# Patient Record
Sex: Male | Born: 1955
Health system: Southern US, Community
[De-identification: ages and names within clinical notes are randomized; demographics above are authoritative.]

## PROBLEM LIST (undated history)

## (undated) DIAGNOSIS — E785 Hyperlipidemia, unspecified: Secondary | ICD-10-CM

## (undated) DIAGNOSIS — J439 Emphysema, unspecified: Secondary | ICD-10-CM

## (undated) DIAGNOSIS — K219 Gastro-esophageal reflux disease without esophagitis: Secondary | ICD-10-CM

## (undated) DIAGNOSIS — F32A Depression, unspecified: Secondary | ICD-10-CM

## (undated) DIAGNOSIS — N429 Disorder of prostate, unspecified: Secondary | ICD-10-CM

## (undated) DIAGNOSIS — E119 Type 2 diabetes mellitus without complications: Secondary | ICD-10-CM

## (undated) HISTORY — DX: Gastro-esophageal reflux disease without esophagitis: K21.9

## (undated) HISTORY — DX: Emphysema, unspecified: J43.9

## (undated) HISTORY — PX: CATARACT EXTRACTION, BILATERAL: SHX1313

## (undated) HISTORY — DX: Disorder of prostate, unspecified: N42.9

---

## 2007-12-14 ENCOUNTER — Emergency Department: Payer: Self-pay | Admitting: Emergency Medicine

## 2010-02-24 ENCOUNTER — Inpatient Hospital Stay: Payer: Self-pay | Admitting: Internal Medicine

## 2011-04-08 ENCOUNTER — Ambulatory Visit: Payer: Self-pay | Admitting: Internal Medicine

## 2011-09-07 ENCOUNTER — Ambulatory Visit: Payer: Self-pay | Admitting: Internal Medicine

## 2011-09-08 ENCOUNTER — Ambulatory Visit: Payer: Self-pay | Admitting: Internal Medicine

## 2013-05-23 LAB — CBC AND DIFFERENTIAL: HEMOGLOBIN: 15.6 g/dL (ref 13.5–17.5)

## 2013-05-23 LAB — PSA: PSA: 2.6

## 2013-08-14 HISTORY — PX: COLONOSCOPY: SHX174

## 2013-08-14 LAB — HM COLONOSCOPY

## 2014-06-08 DIAGNOSIS — Z9889 Other specified postprocedural states: Secondary | ICD-10-CM | POA: Insufficient documentation

## 2014-06-08 DIAGNOSIS — Z9641 Presence of insulin pump (external) (internal): Secondary | ICD-10-CM | POA: Insufficient documentation

## 2015-01-27 LAB — MICROALBUMIN, URINE: MICROALB UR: NORMAL

## 2015-03-06 ENCOUNTER — Encounter: Payer: Self-pay | Admitting: Internal Medicine

## 2015-03-06 ENCOUNTER — Other Ambulatory Visit: Payer: Self-pay | Admitting: Internal Medicine

## 2015-03-06 DIAGNOSIS — G4700 Insomnia, unspecified: Secondary | ICD-10-CM | POA: Insufficient documentation

## 2015-03-06 DIAGNOSIS — N400 Enlarged prostate without lower urinary tract symptoms: Secondary | ICD-10-CM | POA: Insufficient documentation

## 2015-03-06 DIAGNOSIS — E1040 Type 1 diabetes mellitus with diabetic neuropathy, unspecified: Secondary | ICD-10-CM | POA: Insufficient documentation

## 2015-03-06 DIAGNOSIS — F17200 Nicotine dependence, unspecified, uncomplicated: Secondary | ICD-10-CM | POA: Insufficient documentation

## 2015-03-09 ENCOUNTER — Other Ambulatory Visit: Payer: Self-pay | Admitting: Internal Medicine

## 2015-03-09 ENCOUNTER — Encounter: Payer: Self-pay | Admitting: Internal Medicine

## 2015-03-09 ENCOUNTER — Ambulatory Visit (INDEPENDENT_AMBULATORY_CARE_PROVIDER_SITE_OTHER): Payer: BLUE CROSS/BLUE SHIELD | Admitting: Internal Medicine

## 2015-03-09 VITALS — BP 110/64 | HR 76 | Temp 98.0°F | Ht 67.0 in | Wt 161.8 lb

## 2015-03-09 DIAGNOSIS — F17200 Nicotine dependence, unspecified, uncomplicated: Secondary | ICD-10-CM

## 2015-03-09 DIAGNOSIS — J019 Acute sinusitis, unspecified: Secondary | ICD-10-CM

## 2015-03-09 DIAGNOSIS — E1042 Type 1 diabetes mellitus with diabetic polyneuropathy: Secondary | ICD-10-CM | POA: Diagnosis not present

## 2015-03-09 MED ORDER — CEFDINIR 300 MG PO CAPS: 300.0000 mg | ORAL_CAPSULE | Freq: Two times a day (BID) | ORAL | Status: DC

## 2015-03-09 NOTE — Patient Instructions (Signed)
Use Flonase nasal spray daily for 2 weeks  Use Tussin (sugar free) cough syrup to loosen chest congestion

## 2015-03-09 NOTE — Progress Notes (Signed)
Date:  03/09/2015   Name:  Paul Stokes   DOB:  [birth year]   MRN:  [id number]   Chief Complaint: Cough Cough This is a recurrent problem. The current episode started more than 1 month ago. The problem has been gradually worsening. The problem occurs hourly. The cough is productive of purulent sputum. Associated symptoms include nasal congestion, rhinorrhea and shortness of breath. Pertinent negatives include no chest pain, chills, fever, headaches, sweats or wheezing. The symptoms are aggravated by exercise. Treatments tried: Treated with a Z-Pak 6 weeks ago. The treatment provided no relief. His past medical history is significant for environmental allergies.   He quit smoking for 3 months using bupropion. However he started smoking again. He still taking bupropion because it helps with his mood disorder. This was prescribed by his endocrinologist. Type 1 diabetes - patient is followed by endocrinology currently on an insulin pump. Reports doing fairly well without complications other than mild peripheral neuropathy. He's due for ophthalmology exam. His last A1c was 8.2.  Review of Systems  Constitutional: Negative for fever and chills.  HENT: Positive for congestion, rhinorrhea and sinus pressure. Negative for tinnitus and trouble swallowing.   Respiratory: Positive for cough and shortness of breath. Negative for wheezing.   Cardiovascular: Negative for chest pain and palpitations.  Allergic/Immunologic: Positive for environmental allergies.  Neurological: Negative for weakness, light-headedness and headaches.    Patient Active Problem List   Diagnosis Date Noted  . Enlarged prostate 03/06/2015  . Insomnia, persistent 03/06/2015  . Compulsive tobacco user syndrome 03/06/2015  . Type 1 diabetes mellitus ([place]) 03/06/2015  . History of surgical procedure 06/08/2014    Prior to Admission medications   Medication Sig Start Date End Date Taking? Authorizing Provider  aspirin 81 MG tablet  Take 1 tablet by mouth daily.   Yes Historical Provider, MD  BAYER CONTOUR NEXT TEST test strip  01/01/15  Yes Historical Provider, MD  buPROPion (WELLBUTRIN SR) 150 MG 12 hr tablet Take 1 tablet by mouth 2 (two) times daily. 02/02/15  Yes Historical Provider, MD  gabapentin (NEURONTIN) 100 MG capsule Take 2 capsules by mouth daily. 03/05/15  Yes Historical Provider, MD  insulin aspart (NOVOLOG) 100 UNIT/ML injection Inject into the skin. Via insulin pump   Yes Historical Provider, MD  lisinopril (PRINIVIL,ZESTRIL) 10 MG tablet Take 1 tablet by mouth daily.   Yes Historical Provider, MD  pravastatin (PRAVACHOL) 40 MG tablet Take 1 tablet by mouth daily.   Yes Historical Provider, MD  zolpidem (AMBIEN) 10 MG tablet Take 1 tablet by mouth at bedtime. 10/13/14  Yes Historical Provider, MD    No Known Allergies  Past Surgical History  Procedure Laterality Date  . Colonoscopy  08/2013    7 tubular adenomas    Social History  Substance Use Topics  . Smoking status: Current Every Day Smoker  . Smokeless tobacco: None  . Alcohol Use: No     Medication list has been reviewed and updated.   Physical Exam  Constitutional: He is oriented to person, place, and time. He appears well-developed. No distress.  HENT:  Head: Normocephalic and atraumatic.  Right Ear: Tympanic membrane and ear canal normal.  Left Ear: Tympanic membrane and ear canal normal.  Nose: Right sinus exhibits maxillary sinus tenderness. Right sinus exhibits no frontal sinus tenderness. Left sinus exhibits maxillary sinus tenderness. Left sinus exhibits no frontal sinus tenderness.  Eyes: Conjunctivae are normal. Right eye exhibits no discharge. Left eye exhibits no discharge. No scleral  icterus.  Neck: Normal range of motion. Neck supple.  Cardiovascular: Normal rate, regular rhythm and normal heart sounds.   Pulmonary/Chest: Effort normal. No respiratory distress. He has decreased breath sounds in the right upper field and  the left upper field.  Musculoskeletal: Normal range of motion.  Neurological: He is alert and oriented to person, place, and time.  Skin: Skin is warm and dry. No rash noted.  Psychiatric: He has a normal mood and affect. His speech is normal and behavior is normal. Thought content normal.  Nursing note and vitals reviewed.   BP 110/64 mmHg  Pulse 76  Temp(Src) 98 F (36.7 C)  Ht 5\' 7"  (1.702 m)  Wt 161 lb 12.8 oz (73.392 kg)  BMI 25.34 kg/m2  SpO2 99%  Assessment and Plan: 1. Acute sinusitis, recurrence not specified, unspecified location Resume Flonase nasal spray May use diabetic Tussin for congestion and cough - cefdinir (OMNICEF) 300 MG capsule; Take 1 capsule (300 mg total) by mouth 2 (two) times daily.  Dispense: 20 capsule; Refill: 0  2. Type 1 diabetes mellitus with diabetic polyneuropathy (HCC) Followed by endocrinology - last A1c was 8.2 Currently on insulin pump  3. Compulsive tobacco user syndrome Continue bupropion With current illness resolved encouraged patient to find motivation to quit smoking again   Halina Maidens, MD [facility]  03/09/2015

## 2015-04-14 ENCOUNTER — Other Ambulatory Visit: Payer: Self-pay | Admitting: Internal Medicine

## 2015-05-26 ENCOUNTER — Encounter: Payer: Self-pay | Admitting: Internal Medicine

## 2015-05-26 ENCOUNTER — Ambulatory Visit (INDEPENDENT_AMBULATORY_CARE_PROVIDER_SITE_OTHER): Payer: BLUE CROSS/BLUE SHIELD | Admitting: Internal Medicine

## 2015-05-26 VITALS — BP 115/68 | HR 70 | Temp 97.9°F | Ht 67.0 in | Wt 163.4 lb

## 2015-05-26 DIAGNOSIS — J40 Bronchitis, not specified as acute or chronic: Secondary | ICD-10-CM

## 2015-05-26 MED ORDER — LEVOFLOXACIN 500 MG PO TABS: 500.0000 mg | ORAL_TABLET | Freq: Every day | ORAL | Status: DC

## 2015-05-26 MED ORDER — HYDROCODONE-HOMATROPINE 5-1.5 MG/5ML PO SYRP: 5.0000 mL | ORAL_SOLUTION | Freq: Four times a day (QID) | ORAL | Status: DC | PRN

## 2015-05-26 NOTE — Progress Notes (Signed)
Date:  05/26/2015   Name:  Paul Stokes   DOB:  12-05-1955   MRN:  [id number]   Chief Complaint: Cough Cough This is a new problem. The current episode started 1 to 4 weeks ago. The problem has been waxing and waning. The problem occurs every few minutes. The cough is productive of sputum. Associated symptoms include postnasal drip and wheezing. Pertinent negatives include no chills, ear pain, fever, rash, sore throat, shortness of breath or weight loss. The symptoms are aggravated by exercise. He has tried prescription cough suppressant and OTC cough suppressant for the symptoms. The treatment provided mild relief.      Review of Systems  Constitutional: Negative for fever, chills and weight loss.  HENT: Positive for postnasal drip. Negative for ear pain, hearing loss, sinus pressure, sore throat and tinnitus.   Respiratory: Positive for cough and wheezing. Negative for shortness of breath.   Gastrointestinal: Negative for nausea and diarrhea.  Musculoskeletal: Negative for neck pain and neck stiffness.  Skin: Negative for color change and rash.    Patient Active Problem List   Diagnosis Date Noted  . Enlarged prostate 03/06/2015  . Insomnia, persistent 03/06/2015  . Compulsive tobacco user syndrome 03/06/2015  . Type 1 diabetes mellitus ([place]) 03/06/2015  . History of surgical procedure 06/08/2014    Prior to Admission medications   Medication Sig Start Date End Date Taking? Authorizing Provider  aspirin 81 MG tablet Take 1 tablet by mouth daily.   Yes Historical Provider, MD  BAYER CONTOUR NEXT TEST test strip  01/01/15  Yes Historical Provider, MD  buPROPion (WELLBUTRIN SR) 150 MG 12 hr tablet Take 1 tablet by mouth 2 (two) times daily. 02/02/15  Yes Historical Provider, MD  gabapentin (NEURONTIN) 100 MG capsule Take 2 capsules by mouth daily. 03/05/15  Yes Historical Provider, MD  insulin aspart (NOVOLOG) 100 UNIT/ML injection Inject into the skin. Via insulin pump   Yes  Historical Provider, MD  lisinopril (PRINIVIL,ZESTRIL) 10 MG tablet Take 1 tablet by mouth daily.   Yes Historical Provider, MD  pravastatin (PRAVACHOL) 40 MG tablet Take 1 tablet by mouth daily.   Yes Historical Provider, MD  zolpidem (AMBIEN) 10 MG tablet TAKE ONE TABLET BY MOUTH AT BEDTIME 04/15/15  Yes Glean Hess, MD    No Known Allergies  Past Surgical History  Procedure Laterality Date  . Colonoscopy  08/2013    7 tubular adenomas    Social History  Substance Use Topics  . Smoking status: Current Every Day Smoker -- 1.00 packs/day for 47 years    Types: Cigarettes  . Smokeless tobacco: None  . Alcohol Use: No     Medication list has been reviewed and updated.   Physical Exam  Constitutional: He is oriented to person, place, and time. He appears well-developed. No distress.  HENT:  Head: Normocephalic and atraumatic.  Right Ear: Tympanic membrane and ear canal normal.  Left Ear: Tympanic membrane and ear canal normal.  Mouth/Throat: Oropharynx is clear and moist.  Eyes: Conjunctivae are normal.  Neck: Normal range of motion. Neck supple.  Cardiovascular: Normal rate, regular rhythm and normal heart sounds.   Pulmonary/Chest: Effort normal. No respiratory distress. He has decreased breath sounds in the right upper field and the left upper field. He has no wheezes. He has no rhonchi.  Musculoskeletal: Normal range of motion.  Lymphadenopathy:    He has no cervical adenopathy.  Neurological: He is alert and oriented to person, place, and  time.  Skin: Skin is warm and dry. No rash noted.  Psychiatric: He has a normal mood and affect. His behavior is normal. Thought content normal.  Nursing note and vitals reviewed.   BP 115/68 mmHg  Pulse 70  Temp(Src) 97.9 F (36.6 C) (Oral)  Ht 5\' 7"  (1.702 m)  Wt 163 lb 6.4 oz (74.118 kg)  BMI 25.59 kg/m2  SpO2 97%  Assessment and Plan: 1. Bronchitis Continue mucinex and fluids - levofloxacin (LEVAQUIN) 500 MG  tablet; Take 1 tablet (500 mg total) by mouth daily.  Dispense: 10 tablet; Refill: 0 - HYDROcodone-homatropine (HYCODAN) 5-1.5 MG/5ML syrup; Take 5 mLs by mouth every 6 (six) hours as needed for cough.  Dispense: 120 mL; Refill: 0   Halina Maidens, MD [facility]  05/26/2015

## 2015-05-26 NOTE — Patient Instructions (Signed)
Continue Mucinex twice a day

## 2015-06-14 ENCOUNTER — Encounter: Payer: Self-pay | Admitting: Internal Medicine

## 2015-06-15 ENCOUNTER — Ambulatory Visit (INDEPENDENT_AMBULATORY_CARE_PROVIDER_SITE_OTHER): Payer: BLUE CROSS/BLUE SHIELD | Admitting: Internal Medicine

## 2015-06-15 ENCOUNTER — Encounter: Payer: Self-pay | Admitting: Internal Medicine

## 2015-06-15 VITALS — BP 112/64 | HR 72 | Ht 67.0 in | Wt 166.4 lb

## 2015-06-15 DIAGNOSIS — Z114 Encounter for screening for human immunodeficiency virus [HIV]: Secondary | ICD-10-CM

## 2015-06-15 DIAGNOSIS — E1069 Type 1 diabetes mellitus with other specified complication: Secondary | ICD-10-CM | POA: Insufficient documentation

## 2015-06-15 DIAGNOSIS — R05 Cough: Secondary | ICD-10-CM

## 2015-06-15 DIAGNOSIS — D485 Neoplasm of uncertain behavior of skin: Secondary | ICD-10-CM

## 2015-06-15 DIAGNOSIS — E1042 Type 1 diabetes mellitus with diabetic polyneuropathy: Secondary | ICD-10-CM

## 2015-06-15 DIAGNOSIS — Z0000 Encounter for general adult medical examination without abnormal findings: Secondary | ICD-10-CM | POA: Diagnosis not present

## 2015-06-15 DIAGNOSIS — N401 Enlarged prostate with lower urinary tract symptoms: Secondary | ICD-10-CM | POA: Diagnosis not present

## 2015-06-15 DIAGNOSIS — E785 Hyperlipidemia, unspecified: Secondary | ICD-10-CM

## 2015-06-15 DIAGNOSIS — N138 Other obstructive and reflux uropathy: Secondary | ICD-10-CM

## 2015-06-15 DIAGNOSIS — G4700 Insomnia, unspecified: Secondary | ICD-10-CM

## 2015-06-15 DIAGNOSIS — Z1159 Encounter for screening for other viral diseases: Secondary | ICD-10-CM | POA: Diagnosis not present

## 2015-06-15 DIAGNOSIS — R059 Cough, unspecified: Secondary | ICD-10-CM

## 2015-06-15 LAB — POCT URINALYSIS DIPSTICK
BILIRUBIN UA: NEGATIVE
Blood, UA: NEGATIVE
Glucose, UA: NEGATIVE
Ketones, UA: NEGATIVE
Leukocytes, UA: NEGATIVE
NITRITE UA: NEGATIVE
PH UA: 6
Protein, UA: NEGATIVE
SPEC GRAV UA: 1.02
UROBILINOGEN UA: 0.2

## 2015-06-15 MED ORDER — TAMSULOSIN HCL 0.4 MG PO CAPS: 0.4000 mg | ORAL_CAPSULE | Freq: Every day | ORAL | Status: DC

## 2015-06-15 NOTE — Progress Notes (Signed)
Date:  06/15/2015   Name:  Paul Stokes   DOB:  Sep 05, 1955   MRN:  [id number]   Chief Complaint: Annual Exam and Diabetes Diabetes He presents for his follow-up (followed by Endocrinology) diabetic visit. He has type 1 diabetes mellitus. Pertinent negatives for hypoglycemia include no confusion, dizziness or headaches. Pertinent negatives for diabetes include no chest pain, no fatigue, no polydipsia and no polyuria.  Cough This is a chronic problem. The current episode started more than 1 month ago. The problem has been unchanged. The problem occurs every few minutes. The cough is non-productive. Associated symptoms include shortness of breath. Pertinent negatives include no chest pain, chills, fever, headaches, sore throat or wheezing. Nothing (he has been on ACEI for many years) aggravates the symptoms.  Urinary Frequency  This is a recurrent problem. The pain is mild (and slight penile curvature with erections). Associated symptoms include frequency, hesitancy and urgency. Pertinent negatives include no chills, discharge or hematuria. He has tried nothing (difficulty emptying completely - multiple episodes of nocturia) for the symptoms.   Paul Stokes is a 60 y.o. male who presents today for his Complete Annual Exam. He feels fairly well. He reports exercising none. He reports he is sleeping well with Ambien. He is cutting back on cigarettes and still taking Wellbutrin.   Review of Systems  Constitutional: Negative for fever, chills, diaphoresis and fatigue.  HENT: Positive for hearing loss and trouble swallowing. Negative for sore throat and voice change.   Eyes: Negative for visual disturbance.  Respiratory: Positive for choking, chest tightness and shortness of breath. Negative for cough and wheezing.   Cardiovascular: Negative for chest pain, palpitations and leg swelling.  Gastrointestinal: Negative for abdominal pain, diarrhea, constipation and blood in stool.  Endocrine: Negative  for polydipsia and polyuria.  Genitourinary: Positive for hesitancy, urgency, frequency, difficulty urinating and penile pain (curvature with erections). Negative for dysuria and hematuria.  Musculoskeletal: Negative for arthralgias and gait problem.  Neurological: Negative for dizziness and headaches.  Psychiatric/Behavioral: Positive for sleep disturbance. Negative for confusion and dysphoric mood.    Patient Active Problem List   Diagnosis Date Noted  . Enlarged prostate 03/06/2015  . Insomnia, persistent 03/06/2015  . Compulsive tobacco user syndrome 03/06/2015  . Type 1 diabetes mellitus ([place]) 03/06/2015    Prior to Admission medications   Medication Sig Start Date End Date Taking? Authorizing Provider  aspirin 81 MG tablet Take 1 tablet by mouth daily.   Yes Historical Provider, MD  BAYER CONTOUR NEXT TEST test strip  01/01/15  Yes Historical Provider, MD  buPROPion (WELLBUTRIN SR) 150 MG 12 hr tablet Take 1 tablet by mouth 2 (two) times daily. 02/02/15  Yes Historical Provider, MD  gabapentin (NEURONTIN) 100 MG capsule Take 2 capsules by mouth daily. 03/05/15  Yes Historical Provider, MD  insulin aspart (NOVOLOG) 100 UNIT/ML injection Inject into the skin. Via insulin pump   Yes Historical Provider, MD  lisinopril (PRINIVIL,ZESTRIL) 10 MG tablet Take 1 tablet by mouth daily.   Yes Historical Provider, MD  pravastatin (PRAVACHOL) 40 MG tablet Take 1 tablet by mouth daily.   Yes Historical Provider, MD  zolpidem (AMBIEN) 10 MG tablet TAKE ONE TABLET BY MOUTH AT BEDTIME 04/15/15  Yes Glean Hess, MD    No Known Allergies  Past Surgical History  Procedure Laterality Date  . Colonoscopy  08/2013    7 tubular adenomas    Social History  Substance Use Topics  . Smoking status:  Current Every Day Smoker -- 1.00 packs/day for 47 years    Types: Cigarettes  . Smokeless tobacco: None     Comment: trying to cut back some  . Alcohol Use: No    Medication list has been reviewed  and updated.  Physical Exam  Constitutional: He is oriented to person, place, and time. He appears well-developed and well-nourished.  HENT:  Head: Normocephalic.  Right Ear: Tympanic membrane, external ear and ear canal normal.  Left Ear: Tympanic membrane, external ear and ear canal normal.  Nose: Nose normal.  Mouth/Throat: Uvula is midline and oropharynx is clear and moist.  Eyes: Conjunctivae and EOM are normal. Pupils are equal, round, and reactive to light.  Neck: Normal range of motion. Neck supple. Carotid bruit is not present. No thyromegaly present.  Cardiovascular: Normal rate, regular rhythm, normal heart sounds and intact distal pulses.   Pulmonary/Chest: Effort normal and breath sounds normal. He has no wheezes. Right breast exhibits no mass. Left breast exhibits no mass.  Abdominal: Soft. Normal appearance and bowel sounds are normal. There is no hepatosplenomegaly. There is no tenderness.  Genitourinary: Rectum normal. Rectal exam shows no mass. Prostate is enlarged. Prostate is not tender.  Musculoskeletal: Normal range of motion.  Lymphadenopathy:    He has no cervical adenopathy.  Neurological: He is alert and oriented to person, place, and time. He has normal reflexes. No cranial nerve deficit or sensory deficit.  Skin: Skin is warm, dry and intact.     Psychiatric: He has a normal mood and affect. His speech is normal and behavior is normal. Judgment and thought content normal.  Nursing note and vitals reviewed.   BP 112/64 mmHg  Pulse 72  Ht 5\' 7"  (1.702 m)  Wt 166 lb 6.4 oz (75.479 kg)  BMI 26.06 kg/m2  Assessment and Plan: 1. Annual physical exam Vaccinations are up to date  Patient encouraged to continue smoking reduction and can continue Wellbutrin indefinitely if needed - POCT urinalysis dipstick  2. BPH with obstruction/lower urinary tract symptoms Begin medication for symptoms Pt reports mild penile curvature without pain - consider Urology  evaluation if worsening - tamsulosin (FLOMAX) 0.4 MG CAPS capsule; Take 1 capsule (0.4 mg total) by mouth at bedtime.  Dispense: 30 capsule; Refill: 3 - PSA  3. Insomnia, persistent On Ambien - TSH  4. Need for hepatitis C screening test - Hepatitis C antibody  5. Encounter for screening for HIV - HIV antibody  6. Hyperlipidemia On statin therapy - Lipid panel  7. Type 1 diabetes mellitus with diabetic polyneuropathy (HCC) Followed by endocrinology - CBC with Differential/Platelet - Comprehensive metabolic panel  8. Neoplasm of uncertain behavior of skin - Ambulatory referral to Dermatology  9. Cough Hold Lisinopril for 2 weeks; if cough resolves then call for ARB Rx If cough persists - resume Lisinopril and refer to ENT   Halina Maidens, MD [facility]  06/15/2015

## 2015-06-16 ENCOUNTER — Telehealth: Payer: Self-pay

## 2015-06-16 LAB — CBC WITH DIFFERENTIAL/PLATELET
BASOS: 2 %
Basophils Absolute: 0.2 10*3/uL (ref 0.0–0.2)
EOS (ABSOLUTE): 0.3 10*3/uL (ref 0.0–0.4)
EOS: 4 %
HEMATOCRIT: 44.5 % (ref 37.5–51.0)
Hemoglobin: 14.9 g/dL (ref 12.6–17.7)
Immature Grans (Abs): 0 10*3/uL (ref 0.0–0.1)
Immature Granulocytes: 0 %
LYMPHS ABS: 2.5 10*3/uL (ref 0.7–3.1)
Lymphs: 38 %
MCH: 29.7 pg (ref 26.6–33.0)
MCHC: 33.5 g/dL (ref 31.5–35.7)
MCV: 89 fL (ref 79–97)
MONOS ABS: 0.4 10*3/uL (ref 0.1–0.9)
Monocytes: 7 %
NEUTROS ABS: 3.3 10*3/uL (ref 1.4–7.0)
Neutrophils: 49 %
Platelets: 327 10*3/uL (ref 150–379)
RBC: 5.01 x10E6/uL (ref 4.14–5.80)
RDW: 13 % (ref 12.3–15.4)
WBC: 6.7 10*3/uL (ref 3.4–10.8)

## 2015-06-16 LAB — COMPREHENSIVE METABOLIC PANEL
A/G RATIO: 1.9 (ref 1.1–2.5)
ALK PHOS: 87 IU/L (ref 39–117)
ALT: 24 IU/L (ref 0–44)
AST: 16 IU/L (ref 0–40)
Albumin: 4.3 g/dL (ref 3.6–4.8)
BILIRUBIN TOTAL: 0.4 mg/dL (ref 0.0–1.2)
BUN/Creatinine Ratio: 17 (ref 10–22)
BUN: 17 mg/dL (ref 8–27)
CALCIUM: 9.5 mg/dL (ref 8.6–10.2)
CO2: 24 mmol/L (ref 18–29)
Chloride: 100 mmol/L (ref 96–106)
Creatinine, Ser: 1 mg/dL (ref 0.76–1.27)
GFR calc Af Amer: 94 mL/min/{1.73_m2} (ref >59)
GFR, EST NON AFRICAN AMERICAN: 81 mL/min/{1.73_m2} (ref >59)
GLOBULIN, TOTAL: 2.3 g/dL (ref 1.5–4.5)
Glucose: 204 mg/dL — ABNORMAL HIGH (ref 65–99)
POTASSIUM: 5.3 mmol/L — AB (ref 3.5–5.2)
SODIUM: 137 mmol/L (ref 134–144)
Total Protein: 6.6 g/dL (ref 6.0–8.5)

## 2015-06-16 LAB — HIV ANTIBODY (ROUTINE TESTING W REFLEX): HIV SCREEN 4TH GENERATION: NONREACTIVE

## 2015-06-16 LAB — LIPID PANEL
CHOL/HDL RATIO: 2.2 ratio (ref 0.0–5.0)
Cholesterol, Total: 101 mg/dL (ref 100–199)
HDL: 45 mg/dL (ref >39)
LDL CALC: 43 mg/dL (ref 0–99)
Triglycerides: 66 mg/dL (ref 0–149)
VLDL CHOLESTEROL CAL: 13 mg/dL (ref 5–40)

## 2015-06-16 LAB — TSH: TSH: 2.13 u[IU]/mL (ref 0.450–4.500)

## 2015-06-16 LAB — HEPATITIS C ANTIBODY: Hep C Virus Ab: <0.1 s/co ratio (ref 0.0–0.9)

## 2015-06-16 LAB — PSA: Prostate Specific Ag, Serum: 3.3 ng/mL (ref 0.0–4.0)

## 2015-06-16 NOTE — Telephone Encounter (Signed)
Spoke with patient. Patient advised of all results and verbalized understanding. Will call back with any future questions or concerns. MAH  

## 2015-06-16 NOTE — Telephone Encounter (Signed)
-----   Message from Glean Hess, MD sent at 06/16/2015  7:41 AM EST ----- Labs are normal.  HIV and Hep C are negative.  PSA is only slightly higher than in 2015.  Recommend yearly monitoring.

## 2015-06-16 NOTE — Telephone Encounter (Signed)
Left message for patient to call back  

## 2015-07-29 ENCOUNTER — Other Ambulatory Visit: Payer: Self-pay

## 2015-07-29 MED ORDER — BUPROPION HCL ER (SR) 150 MG PO TB12: 150.0000 mg | ORAL_TABLET | Freq: Two times a day (BID) | ORAL | Status: DC

## 2015-07-29 NOTE — Telephone Encounter (Signed)
Patient called in today and requested refill.  

## 2015-08-17 LAB — HEMOGLOBIN A1C: Hemoglobin A1C: 9.3

## 2015-09-09 ENCOUNTER — Other Ambulatory Visit: Payer: Self-pay | Admitting: Internal Medicine

## 2015-10-01 ENCOUNTER — Other Ambulatory Visit: Payer: Self-pay | Admitting: Internal Medicine

## 2015-10-26 ENCOUNTER — Ambulatory Visit (INDEPENDENT_AMBULATORY_CARE_PROVIDER_SITE_OTHER): Payer: BLUE CROSS/BLUE SHIELD | Admitting: Internal Medicine

## 2015-10-26 ENCOUNTER — Encounter: Payer: Self-pay | Admitting: Internal Medicine

## 2015-10-26 VITALS — BP 98/68 | HR 66 | Temp 98.2°F | Resp 16 | Ht 67.0 in | Wt 158.0 lb

## 2015-10-26 DIAGNOSIS — J069 Acute upper respiratory infection, unspecified: Secondary | ICD-10-CM

## 2015-10-26 MED ORDER — AZELASTINE HCL 0.1 % NA SOLN: 1.0000 | Freq: Two times a day (BID) | NASAL | Status: DC

## 2015-10-26 NOTE — Patient Instructions (Signed)
Claritin 10 mg - once a day (loratidine generic name)

## 2015-10-26 NOTE — Progress Notes (Signed)
Date:  10/26/2015   Name:  Paul Stokes   DOB:  Jun 12, 1955   MRN:  [id number]   Chief Complaint: Sinus Problem Sinus Problem This is a new problem. The current episode started 1 to 4 weeks ago. The problem has been waxing and waning since onset. Associated symptoms include congestion, coughing, a hoarse voice and sinus pressure. Pertinent negatives include no chills, ear pain or sore throat.  Cough with clear white phlegm intermittently.  Coughs more when he gets overheated. He has clear rhinorhea. He was given Augmentin by [facility] which caused severe nausea, vomiting and diarrhea.   Review of Systems  Constitutional: Negative for fever, chills and fatigue.  HENT: Positive for congestion, hoarse voice, postnasal drip, rhinorrhea and sinus pressure. Negative for ear pain, hearing loss, sore throat, tinnitus and trouble swallowing.   Respiratory: Positive for cough. Negative for chest tightness and wheezing.   Cardiovascular: Negative for chest pain and palpitations.    Patient Active Problem List   Diagnosis Date Noted  . BPH with obstruction/lower urinary tract symptoms 06/15/2015  . Hyperlipidemia 06/15/2015  . Insomnia, persistent 03/06/2015  . Compulsive tobacco user syndrome 03/06/2015  . Type 1 diabetes mellitus ([place]) 03/06/2015    Prior to Admission medications   Medication Sig Start Date End Date Taking? Authorizing Provider  aspirin 81 MG tablet Take 1 tablet by mouth daily.   Yes Historical Provider, MD  BAYER CONTOUR NEXT TEST test strip  01/01/15  Yes Historical Provider, MD  buPROPion (WELLBUTRIN SR) 150 MG 12 hr tablet Take 1 tablet (150 mg total) by mouth 2 (two) times daily. 07/29/15  Yes Glean Hess, MD  gabapentin (NEURONTIN) 100 MG capsule Take 2 capsules by mouth daily. 03/05/15  Yes Historical Provider, MD  insulin aspart (NOVOLOG) 100 UNIT/ML injection Inject into the skin. Via insulin pump   Yes Historical Provider, MD  lisinopril  (PRINIVIL,ZESTRIL) 10 MG tablet Take 1 tablet by mouth daily.   Yes Historical Provider, MD  pravastatin (PRAVACHOL) 40 MG tablet Take 1 tablet by mouth daily.   Yes Historical Provider, MD  tamsulosin (FLOMAX) 0.4 MG CAPS capsule TAKE 1 CAPSULE BY MOUTH AT BEDTIME 10/01/15  Yes Glean Hess, MD  VENTOLIN HFA 108 (90 Base) MCG/ACT inhaler 1-2 INHALATIONS EVERY 4-6 HOURS AS NEEDED FOR WHEEZING. DISPENSE SPACER AS NEEDED. 10/16/15  Yes Historical Provider, MD  zolpidem (AMBIEN) 10 MG tablet TAKE ONE TABLET BY MOUTH AT BEDTIME 09/09/15  Yes Glean Hess, MD    No Known Allergies  Past Surgical History  Procedure Laterality Date  . Colonoscopy  08/2013    7 tubular adenomas    Social History  Substance Use Topics  . Smoking status: Current Every Day Smoker -- 1.00 packs/day for 47 years    Types: Cigarettes  . Smokeless tobacco: None     Comment: trying to cut back some  . Alcohol Use: No     Medication list has been reviewed and updated.   Physical Exam  Constitutional: He is oriented to person, place, and time. He appears well-developed. No distress.  HENT:  Head: Normocephalic and atraumatic.  Right Ear: Tympanic membrane and ear canal normal.  Left Ear: Tympanic membrane and ear canal normal.  Nose: Right sinus exhibits no maxillary sinus tenderness and no frontal sinus tenderness. Left sinus exhibits no maxillary sinus tenderness and no frontal sinus tenderness.  Mouth/Throat: Uvula is midline and oropharynx is clear and moist.  Cardiovascular: Normal rate,  regular rhythm and normal heart sounds.   Pulmonary/Chest: Effort normal and breath sounds normal. No respiratory distress. He has no wheezes. He has no rales.  Musculoskeletal: Normal range of motion.  Neurological: He is alert and oriented to person, place, and time.  Skin: Skin is warm and dry. No rash noted.  Psychiatric: He has a normal mood and affect. His behavior is normal. Thought content normal.  Nursing  note and vitals reviewed.   BP 98/68 mmHg  Pulse 66  Temp(Src) 98.2 F (36.8 C)  Resp 16  Ht 5\' 7"  (1.702 m)  Wt 158 lb (71.668 kg)  BMI 24.74 kg/m2  SpO2 97%  Assessment and Plan: 1. Viral URI May be seasonal allergies; does not appear to be bacterial Begin Claritin 10 mg daily - azelastine (ASTELIN) 0.1 % nasal spray; Place 1-2 sprays into both nostrils 2 (two) times daily. Use in each nostril as directed  Dispense: 30 mL; Refill: [place], MD [facility]  10/26/2015

## 2015-12-24 ENCOUNTER — Encounter: Payer: Self-pay | Admitting: Internal Medicine

## 2016-04-04 ENCOUNTER — Other Ambulatory Visit: Payer: Self-pay | Admitting: Internal Medicine

## 2016-04-04 MED ORDER — ZOLPIDEM TARTRATE 10 MG PO TABS: 10.0000 mg | ORAL_TABLET | Freq: Every day | ORAL | 5 refills | Status: DC

## 2016-04-11 ENCOUNTER — Other Ambulatory Visit: Payer: Self-pay | Admitting: Internal Medicine

## 2016-09-13 ENCOUNTER — Other Ambulatory Visit: Payer: Self-pay | Admitting: Internal Medicine

## 2016-09-13 NOTE — Telephone Encounter (Signed)
Called patient to schedule med refill 11/15/16.

## 2016-09-19 LAB — HM DIABETES EYE EXAM

## 2016-09-19 LAB — HEMOGLOBIN A1C: Hemoglobin A1C: 9.1

## 2016-09-19 LAB — MICROALBUMIN, URINE: MICROALB UR: NORMAL

## 2016-10-10 ENCOUNTER — Other Ambulatory Visit: Payer: Self-pay | Admitting: Internal Medicine

## 2016-10-11 NOTE — Telephone Encounter (Signed)
Pharm requesting meds.

## 2016-11-07 ENCOUNTER — Other Ambulatory Visit: Payer: Self-pay | Admitting: Internal Medicine

## 2016-11-15 ENCOUNTER — Ambulatory Visit (INDEPENDENT_AMBULATORY_CARE_PROVIDER_SITE_OTHER): Payer: BLUE CROSS/BLUE SHIELD | Admitting: Internal Medicine

## 2016-11-15 ENCOUNTER — Encounter: Payer: Self-pay | Admitting: Internal Medicine

## 2016-11-15 VITALS — BP 114/58 | HR 71 | Ht 67.0 in | Wt 162.2 lb

## 2016-11-15 DIAGNOSIS — N401 Enlarged prostate with lower urinary tract symptoms: Secondary | ICD-10-CM | POA: Diagnosis not present

## 2016-11-15 DIAGNOSIS — N138 Other obstructive and reflux uropathy: Secondary | ICD-10-CM | POA: Diagnosis not present

## 2016-11-15 DIAGNOSIS — E1040 Type 1 diabetes mellitus with diabetic neuropathy, unspecified: Secondary | ICD-10-CM | POA: Diagnosis not present

## 2016-11-15 DIAGNOSIS — G4700 Insomnia, unspecified: Secondary | ICD-10-CM

## 2016-11-15 DIAGNOSIS — F17200 Nicotine dependence, unspecified, uncomplicated: Secondary | ICD-10-CM | POA: Diagnosis not present

## 2016-11-15 DIAGNOSIS — F39 Unspecified mood [affective] disorder: Secondary | ICD-10-CM | POA: Diagnosis not present

## 2016-11-15 MED ORDER — BUPROPION HCL ER (SR) 150 MG PO TB12: 150.0000 mg | ORAL_TABLET | Freq: Two times a day (BID) | ORAL | 12 refills | Status: DC

## 2016-11-15 MED ORDER — TAMSULOSIN HCL 0.4 MG PO CAPS: 0.4000 mg | ORAL_CAPSULE | Freq: Every day | ORAL | 12 refills | Status: DC

## 2016-11-15 NOTE — Progress Notes (Signed)
Date:  11/15/2016   Name:  Paul Stokes   DOB:  1955/10/07   MRN:  [id number]   Chief Complaint: Insomnia (In order to keep Lorrin Mais needs to be seen periodically. - Needs no refills at this time. ) Insomnia  Primary symptoms: sleep disturbance.  The problem occurs nightly. The problem is unchanged. The symptoms are relieved by medication.  Benign Prostatic Hypertrophy  This is a chronic problem. The problem is unchanged. Irritative symptoms include frequency and nocturia. Obstructive symptoms include incomplete emptying, an intermittent stream and a slower stream. Pertinent negatives include no chills. Past treatments include tamsulosin. The treatment provided moderate relief.  Mood disorder - started on wellbutrin to help quit smoking but then found it helped his mood as well.  He is doing well currently. DM - on a new insulin pump and his control is much improved.  He is still working on diet.  He did stop lisinopril due to cough but did not get another medication.    Review of Systems  Constitutional: Negative for chills, fatigue and fever.  HENT: Positive for postnasal drip. Negative for congestion.   Eyes: Positive for visual disturbance.  Respiratory: Positive for cough (AM cough). Negative for chest tightness, shortness of breath and wheezing.   Cardiovascular: Negative for chest pain and palpitations.  Gastrointestinal: Negative for abdominal pain.  Genitourinary: Positive for decreased urine volume, frequency, incomplete emptying and nocturia.  Musculoskeletal: Negative for arthralgias.  Neurological: Positive for numbness and headaches. Negative for dizziness.  Hematological: Negative for adenopathy.  Psychiatric/Behavioral: Positive for sleep disturbance. Negative for dysphoric mood. The patient has insomnia. The patient is not nervous/anxious.     Patient Active Problem List   Diagnosis Date Noted  . BPH with obstruction/lower urinary tract symptoms 06/15/2015  .  Hyperlipidemia 06/15/2015  . Insomnia, persistent 03/06/2015  . Compulsive tobacco user syndrome 03/06/2015  . Type 1 diabetes mellitus with sensory neuropathy ([place]) 03/06/2015  . Presence of insulin pump 06/08/2014    Prior to Admission medications   Medication Sig Start Date End Date Taking? Authorizing Provider  aspirin 81 MG tablet Take 1 tablet by mouth daily.    [provider]  azelastine (ASTELIN) 0.1 % nasal spray Place 1-2 sprays into both nostrils 2 (two) times daily. Use in each nostril as directed 10/26/15   Glean Hess, MD  BAYER CONTOUR NEXT TEST test strip  01/01/15   [provider]  buPROPion [facility] SR) 150 MG 12 hr tablet TAKE ONE TABLET BY MOUTH TWICE DAILY 09/13/16   Glean Hess, MD  gabapentin (NEURONTIN) 100 MG capsule Take 2 capsules by mouth daily. 03/05/15   [provider]  insulin aspart (NOVOLOG) 100 UNIT/ML injection Inject into the skin. Via insulin pump    [provider]  lisinopril (PRINIVIL,ZESTRIL) 10 MG tablet Take 1 tablet by mouth daily.    [provider]  pravastatin (PRAVACHOL) 40 MG tablet Take 1 tablet by mouth daily.    [provider]  tamsulosin (FLOMAX) 0.4 MG CAPS capsule TAKE 1 CAPSULE BY MOUTH AT BEDTIME 10/01/15   Glean Hess, MD  VENTOLIN HFA 108 (90 Base) MCG/ACT inhaler 1-2 INHALATIONS EVERY 4-6 HOURS AS NEEDED FOR WHEEZING. DISPENSE SPACER AS NEEDED. 10/16/15   [provider]  zolpidem (AMBIEN) 10 MG tablet TAKE 1 TABLET BY MOUTH AT BEDTIME 11/07/16   Glean Hess, MD    Allergies  Allergen Reactions  . Clavulanic Acid Nausea And Vomiting  .  Lisinopril Cough    Past Surgical History:  Procedure Laterality Date  . COLONOSCOPY  08/2013   7 tubular adenomas    Social History  Substance Use Topics  . Smoking status: Current Every Day Smoker    Packs/day: 1.00    Years: 47.00    Types: Cigarettes  . Smokeless tobacco: Never Used     Comment:  trying to cut back some  . Alcohol use No   Depression screen PHQ 2/9 11/15/2016  Decreased Interest 0  Down, Depressed, Hopeless 0  PHQ - 2 Score 0     Medication list has been reviewed and updated.   Physical Exam  Constitutional: He is oriented to person, place, and time. He appears well-developed. No distress.  HENT:  Head: Normocephalic and atraumatic.  Neck: Normal range of motion. Neck supple.  Cardiovascular: Normal rate, regular rhythm and normal heart sounds.   Pulmonary/Chest: Effort normal and breath sounds normal. No respiratory distress. He has no wheezes.  Abdominal: Soft.  Musculoskeletal: He exhibits no edema or tenderness.  Neurological: He is alert and oriented to person, place, and time.  Skin: Skin is warm and dry. No rash noted.  Psychiatric: He has a normal mood and affect. His behavior is normal. Thought content normal.  Nursing note and vitals reviewed.   BP (!) 114/58   Pulse 71   Ht 5\' 7"  (1.702 m)   Wt 162 lb 3.2 oz (73.6 kg)   SpO2 97%   BMI 25.40 kg/m   Assessment and Plan: 1. Type 1 diabetes mellitus with sensory neuropathy (HCC) Followed by Endo Ask about another medication such as an ARB for renal protection  2. BPH with obstruction/lower urinary tract symptoms Resume tamsulosin - tamsulosin (FLOMAX) 0.4 MG CAPS capsule; Take 1 capsule (0.4 mg total) by mouth at bedtime.  Dispense: 30 capsule; Refill: 12  3. Insomnia, persistent On ambien without side effects  4. Compulsive tobacco user syndrome Has reduced use but does not feel he can quit completely at this time  5. Mood disorder ([place]) Doing well on Wellbutrin   Meds ordered this encounter  Medications  . tamsulosin (FLOMAX) 0.4 MG CAPS capsule    Sig: Take 1 capsule (0.4 mg total) by mouth at bedtime.    Dispense:  30 capsule    Refill:  12  . buPROPion (WELLBUTRIN SR) 150 MG 12 hr tablet    Sig: Take 1 tablet (150 mg total) by mouth 2 (two) times daily.    Dispense:  60  tablet    Refill:  12    FOR NEXT FILL THANKS    Halina Maidens, MD [facility]  11/15/2016

## 2016-11-15 NOTE — Patient Instructions (Signed)
Ask Dr. Gabriel Carina about another medication to protect your kidneys that is not lisinopril.

## 2017-04-20 ENCOUNTER — Other Ambulatory Visit: Payer: Self-pay | Admitting: Internal Medicine

## 2017-05-18 ENCOUNTER — Encounter: Payer: BLUE CROSS/BLUE SHIELD | Admitting: Internal Medicine

## 2017-07-25 LAB — HEMOGLOBIN A1C: Hemoglobin A1C: 9.3

## 2017-07-25 LAB — MICROALBUMIN, URINE: Microalb, Ur: NORMAL

## 2017-09-04 ENCOUNTER — Ambulatory Visit (INDEPENDENT_AMBULATORY_CARE_PROVIDER_SITE_OTHER): Payer: No Typology Code available for payment source | Admitting: Internal Medicine

## 2017-09-04 ENCOUNTER — Encounter: Payer: Self-pay | Admitting: Internal Medicine

## 2017-09-04 VITALS — BP 134/74 | HR 97 | Ht 67.0 in | Wt 169.0 lb

## 2017-09-04 DIAGNOSIS — F39 Unspecified mood [affective] disorder: Secondary | ICD-10-CM | POA: Diagnosis not present

## 2017-09-04 MED ORDER — BUPROPION HCL ER (SR) 150 MG PO TB12: ORAL_TABLET | ORAL | 3 refills | Status: DC

## 2017-09-04 NOTE — Progress Notes (Signed)
Date:  09/04/2017   Name:  Paul Stokes   DOB:  March 15, 1956   MRN:  [id number]   Chief Complaint: Anxiety (Taking Bupropion and Ambien. Having serious mood swings. Concerned about continue medications. When taking 3 tablets a day for one day last week he felt like it helped him better but didn't want to continue without doctors advise. GAD 7- 17. )  Anxiety  Presents for follow-up visit. Symptoms include nervous/anxious behavior and shortness of breath (intermittently but has started smoking again). Patient reports no chest pain, dizziness, palpitations or suicidal ideas. Symptoms occur most days. The severity of symptoms is moderate and causing significant distress. The quality of sleep is fair.   Compliance with medications is 76-100% (has been taking welbutrin for many years).  Mood disorder - has been present for years, with insomnia.  Sx are not depressive, mostly irritable.  He denies suicidal thoughts or plans.    Review of Systems  Constitutional: Negative for chills, fatigue and fever.  Respiratory: Positive for shortness of breath (intermittently but has started smoking again). Negative for choking, chest tightness and wheezing.   Cardiovascular: Negative for chest pain, palpitations and leg swelling.  Musculoskeletal: Positive for arthralgias and gait problem. Negative for back pain.  Neurological: Negative for dizziness and headaches.  Psychiatric/Behavioral: Positive for sleep disturbance. Negative for dysphoric mood, hallucinations, self-injury and suicidal ideas. The patient is nervous/anxious.     Patient Active Problem List   Diagnosis Date Noted  . Mood disorder ([place]) 11/15/2016  . BPH with obstruction/lower urinary tract symptoms 06/15/2015  . Hyperlipidemia 06/15/2015  . Insomnia, persistent 03/06/2015  . Compulsive tobacco user syndrome 03/06/2015  . Type 1 diabetes mellitus with sensory neuropathy ([place]) 03/06/2015  . Presence of insulin pump 06/08/2014     Prior to Admission medications   Medication Sig Start Date End Date Taking? Authorizing Provider  aspirin 81 MG tablet Take 1 tablet by mouth daily.   Yes [provider]  azelastine (ASTELIN) 0.1 % nasal spray Place 1-2 sprays into both nostrils 2 (two) times daily. Use in each nostril as directed 10/26/15  Yes Glean Hess, MD  BAYER CONTOUR NEXT TEST test strip  01/01/15  Yes [provider]  buPROPion (WELLBUTRIN SR) 150 MG 12 hr tablet Take 1 tablet (150 mg total) by mouth 2 (two) times daily. 11/15/16  Yes Glean Hess, MD  gabapentin (NEURONTIN) 100 MG capsule Take 2 capsules by mouth daily. 03/05/15  Yes [provider]  insulin aspart (NOVOLOG) 100 UNIT/ML injection Inject into the skin. Via insulin pump   Yes [provider]  pravastatin (PRAVACHOL) 40 MG tablet Take 1 tablet by mouth daily.   Yes [provider]  tamsulosin (FLOMAX) 0.4 MG CAPS capsule Take 1 capsule (0.4 mg total) by mouth at bedtime. 11/15/16  Yes Glean Hess, MD  VENTOLIN HFA 108 (90 Base) MCG/ACT inhaler 1-2 INHALATIONS EVERY 4-6 HOURS AS NEEDED FOR WHEEZING. DISPENSE SPACER AS NEEDED. 10/16/15  Yes [provider]  zolpidem (AMBIEN) 10 MG tablet TAKE 1 TABLET BY MOUTH AT BEDTIME 04/21/17  Yes Glean Hess, MD    Allergies  Allergen Reactions  . Clavulanic Acid Nausea And Vomiting  . Lisinopril Cough    Past Surgical History:  Procedure Laterality Date  . COLONOSCOPY  08/2013   7 tubular adenomas    Social History   Tobacco Use  . Smoking status: Current Every Day Smoker    Packs/day: 1.00  Years: 47.00    Pack years: 47.00    Types: Cigarettes  . Smokeless tobacco: Never Used  . Tobacco comment: trying to cut back some  Substance Use Topics  . Alcohol use: No    Alcohol/week: 0.0 oz  . Drug use: No     Medication list has been reviewed and updated.  PHQ 2/9 Scores 11/15/2016  PHQ - 2 Score 0   GAD 7 : Generalized  Anxiety Score 09/04/2017  Nervous, Anxious, on Edge 3  Control/stop worrying 3  Worry too much - different things 3  Trouble relaxing 3  Restless 2  Easily annoyed or irritable 3  Afraid - awful might happen 0  Total GAD 7 Score 17  Anxiety Difficulty Extremely difficult      Physical Exam  Constitutional: He is oriented to person, place, and time. He appears well-developed. No distress.  HENT:  Head: Normocephalic and atraumatic.  Eyes: Pupils are equal, round, and reactive to light.  Neck: Normal range of motion. Neck supple.  Cardiovascular: Normal rate, regular rhythm and normal heart sounds.  Pulmonary/Chest: Effort normal and breath sounds normal. No respiratory distress. He has no wheezes.  Musculoskeletal: Normal range of motion.  Neurological: He is alert and oriented to person, place, and time.  Skin: Skin is warm and dry. No rash noted.  Psychiatric: He has a normal mood and affect. His behavior is normal. Thought content normal.    BP 134/74   Pulse 97   Ht 5\' 7"  (1.702 m)   Wt 169 lb (76.7 kg)   SpO2 98%   BMI 26.47 kg/m   Assessment and Plan: 1. Mood disorder ([place]) Will increase to 3 tabs per day. Follow up in one month if not improved for additional medication - buPROPion (WELLBUTRIN SR) 150 MG 12 hr tablet; Take 1 tablet in the AM and 2 tablets at bedtime daily  Dispense: 90 tablet; Refill: 3   Meds ordered this encounter  Medications  . buPROPion (WELLBUTRIN SR) 150 MG 12 hr tablet    Sig: Take 1 tablet in the AM and 2 tablets at bedtime daily    Dispense:  90 tablet    Refill:  3    FOR NEXT FILL THANKS    Partially dictated using Editor, commissioning. Any errors are unintentional.  Halina Maidens, MD [facility]  09/04/2017

## 2017-10-01 ENCOUNTER — Other Ambulatory Visit: Payer: Self-pay | Admitting: Internal Medicine

## 2017-10-25 LAB — HEMOGLOBIN A1C: HEMOGLOBIN A1C: 8.8

## 2017-11-15 ENCOUNTER — Encounter: Payer: Self-pay | Admitting: Internal Medicine

## 2017-11-15 ENCOUNTER — Ambulatory Visit (INDEPENDENT_AMBULATORY_CARE_PROVIDER_SITE_OTHER): Payer: No Typology Code available for payment source | Admitting: Internal Medicine

## 2017-11-15 VITALS — BP 100/60 | HR 80 | Resp 16 | Ht 67.0 in | Wt 168.0 lb

## 2017-11-15 DIAGNOSIS — M5431 Sciatica, right side: Secondary | ICD-10-CM | POA: Diagnosis not present

## 2017-11-15 DIAGNOSIS — F39 Unspecified mood [affective] disorder: Secondary | ICD-10-CM

## 2017-11-15 DIAGNOSIS — E1040 Type 1 diabetes mellitus with diabetic neuropathy, unspecified: Secondary | ICD-10-CM

## 2017-11-15 MED ORDER — GABAPENTIN 300 MG PO CAPS: 600.0000 mg | ORAL_CAPSULE | Freq: Every day | ORAL | 5 refills | Status: DC

## 2017-11-15 MED ORDER — PREDNISONE 10 MG PO TABS: ORAL_TABLET | ORAL | 0 refills | Status: DC

## 2017-11-15 NOTE — Progress Notes (Signed)
Date:  11/15/2017   Name:  Paul Stokes   DOB:  [birth year]   MRN:  [id number]   Chief Complaint: Leg Pain (right leg down to bottom of foot ) Leg Pain   The incident occurred more than 1 week ago. There was no injury mechanism. The pain is present in the right leg. The quality of the pain is described as cramping. The pain is at a severity of 3/10. The pain is moderate. The pain has been fluctuating since onset. Associated symptoms include numbness (in feet). Pertinent negatives include no inability to bear weight, loss of motion or muscle weakness. He reports no foreign bodies present. Exacerbated by: sitting. He has tried NSAIDs for the symptoms. The treatment provided mild relief.  Anxiety  Presents for follow-up visit. Symptoms include nervous/anxious behavior. Patient reports no chest pain, decreased concentration, dizziness, excessive worry, palpitations, restlessness, shortness of breath or suicidal ideas. Current severity: much improved with bupropion. The quality of sleep is fair.    Back Pain  This is a chronic problem. The problem has been gradually worsening since onset. The pain is present in the lumbar spine. The pain radiates to the right foot. The pain is at a severity of 3/10. The pain is mild. The symptoms are aggravated by sitting and bending. Stiffness is present in the morning. Associated symptoms include leg pain and numbness (in feet). Pertinent negatives include no bladder incontinence, bowel incontinence, chest pain, fever, headaches or perianal numbness. He has tried chiropractic manipulation and NSAIDs for the symptoms. The treatment provided moderate relief.  Neuropathy - he has DM neuropathy in both feet.  Taking gabapentin 200 mg at hs with some improvement.    Review of Systems  Constitutional: Negative for chills, fatigue and fever.  Respiratory: Negative for cough, chest tightness and shortness of breath.   Cardiovascular: Negative for chest pain, palpitations  and leg swelling.  Gastrointestinal: Negative for bowel incontinence.  Genitourinary: Negative for bladder incontinence.  Musculoskeletal: Positive for back pain and myalgias (in right lateral calf).  Neurological: Positive for numbness (in feet). Negative for dizziness and headaches.  Psychiatric/Behavioral: Negative for agitation, decreased concentration, dysphoric mood, hallucinations and suicidal ideas. The patient is nervous/anxious.     Patient Active Problem List   Diagnosis Date Noted  . Mood disorder ([place]) 11/15/2016  . BPH with obstruction/lower urinary tract symptoms 06/15/2015  . Hyperlipidemia 06/15/2015  . Insomnia, persistent 03/06/2015  . Compulsive tobacco user syndrome 03/06/2015  . Type 1 diabetes mellitus with sensory neuropathy ([place]) 03/06/2015  . Presence of insulin pump 06/08/2014    Prior to Admission medications   Medication Sig Start Date End Date Taking? Authorizing Provider  aspirin 81 MG tablet Take 1 tablet by mouth daily.    [provider]  azelastine (ASTELIN) 0.1 % nasal spray Place 1-2 sprays into both nostrils 2 (two) times daily. Use in each nostril as directed 10/26/15   Glean Hess, MD  BAYER CONTOUR NEXT TEST test strip  01/01/15   [provider]  buPROPion [facility] SR) 150 MG 12 hr tablet Take 1 tablet in the AM and 2 tablets at bedtime daily 09/04/17   Glean Hess, MD  gabapentin (NEURONTIN) 100 MG capsule Take 2 capsules by mouth daily. 03/05/15   [provider]  insulin aspart (NOVOLOG) 100 UNIT/ML injection Inject into the skin. Via insulin pump    [provider]  pravastatin (PRAVACHOL) 40 MG tablet Take 1 tablet by mouth daily.  [provider]  tamsulosin (FLOMAX) 0.4 MG CAPS capsule Take 1 capsule (0.4 mg total) by mouth at bedtime. 11/15/16   Glean Hess, MD  VENTOLIN HFA 108 (90 Base) MCG/ACT inhaler 1-2 INHALATIONS EVERY 4-6 HOURS AS NEEDED FOR WHEEZING. DISPENSE SPACER  AS NEEDED. 10/16/15   [provider]  zolpidem (AMBIEN) 10 MG tablet TAKE 1 TABLET BY MOUTH AT BEDTIME 10/02/17   Glean Hess, MD    Allergies  Allergen Reactions  . Clavulanic Acid Nausea And Vomiting  . Lisinopril Cough    Past Surgical History:  Procedure Laterality Date  . COLONOSCOPY  08/2013   7 tubular adenomas    Social History   Tobacco Use  . Smoking status: Current Every Day Smoker    Packs/day: 1.00    Years: 47.00    Pack years: 47.00    Types: Cigarettes  . Smokeless tobacco: Never Used  . Tobacco comment: trying to cut back some  Substance Use Topics  . Alcohol use: No    Alcohol/week: 0.0 oz  . Drug use: No     Medication list has been reviewed and updated.  Current Meds  Medication Sig  . aspirin 81 MG tablet Take 1 tablet by mouth daily.  Marland Kitchen azelastine (ASTELIN) 0.1 % nasal spray Place 1-2 sprays into both nostrils 2 (two) times daily. Use in each nostril as directed  . BAYER CONTOUR NEXT TEST test strip   . buPROPion (WELLBUTRIN SR) 150 MG 12 hr tablet Take 1 tablet in the AM and 2 tablets at bedtime daily (Patient taking differently: Take 2 tablet in the AM and 1 tablets at bedtime daily)  . gabapentin (NEURONTIN) 300 MG capsule Take 2 capsules (600 mg total) by mouth at bedtime.  . insulin aspart (NOVOLOG) 100 UNIT/ML injection Inject into the skin. Via insulin pump  . pravastatin (PRAVACHOL) 40 MG tablet Take 1 tablet by mouth daily.  . tamsulosin (FLOMAX) 0.4 MG CAPS capsule Take 1 capsule (0.4 mg total) by mouth at bedtime.  . VENTOLIN HFA 108 (90 Base) MCG/ACT inhaler 1-2 INHALATIONS EVERY 4-6 HOURS AS NEEDED FOR WHEEZING. DISPENSE SPACER AS NEEDED.  Marland Kitchen zolpidem (AMBIEN) 10 MG tablet TAKE 1 TABLET BY MOUTH AT BEDTIME  . [DISCONTINUED] gabapentin (NEURONTIN) 100 MG capsule Take 2 capsules by mouth daily.    PHQ 2/9 Scores 11/15/2017 11/15/2016  PHQ - 2 Score 0 0  PHQ- 9 Score 0 -   GAD 7 : Generalized Anxiety Score 11/15/2017 09/04/2017   Nervous, Anxious, on Edge 1 3  Control/stop worrying 0 3  Worry too much - different things 1 3  Trouble relaxing 1 3  Restless 0 2  Easily annoyed or irritable 3 3  Afraid - awful might happen 0 0  Total GAD 7 Score 6 17  Anxiety Difficulty Not difficult at all Extremely difficult      Physical Exam  Constitutional: He is oriented to person, place, and time. He appears well-developed. No distress.  HENT:  Head: Normocephalic and atraumatic.  Cardiovascular: Normal rate, regular rhythm and normal heart sounds.  Pulmonary/Chest: Effort normal and breath sounds normal. No respiratory distress.  Abdominal: Soft. Bowel sounds are normal.  Musculoskeletal: Normal range of motion.       Lumbar back: He exhibits tenderness. He exhibits no deformity and no spasm.  Calf muscles soft, no cord or swelling  Neurological: He is alert and oriented to person, place, and time. He has normal strength. A sensory deficit (numbness  under toes of both feet) is present. Gait normal.  Reflex Scores:      Patellar reflexes are 1+ on the right side and 1+ on the left side.      Achilles reflexes are 1+ on the right side and 1+ on the left side. SLR + on right at 80 deg  Skin: Skin is warm and dry. No rash noted.  Psychiatric: He has a normal mood and affect. His behavior is normal. Thought content normal.  Nursing note and vitals reviewed.   BP 100/60   Pulse 80   Resp 16   Ht 5\' 7"  (1.702 m)   Wt 168 lb (76.2 kg)   SpO2 96%   BMI 26.31 kg/m   Assessment and Plan: 1. Type 1 diabetes mellitus with sensory neuropathy (HCC) Will increase gabapentin to 300-600 mg at HS  2. Sciatica of right side Prednisone taper and increased dose of gabapentin No red flag sx but if persistent, will need plain films and possibly MRI - predniSONE (DELTASONE) 10 MG tablet; Take 6 on day 1, 5 on day 2, 4 on day 3, 3 on day 4, 2 on day 5 and 1 on day 1 then stop.  Dispense: 21 tablet; Refill: 0 - gabapentin  (NEURONTIN) 300 MG capsule; Take 2 capsules (600 mg total) by mouth at bedtime.  Dispense: 60 capsule; Refill: 5  3. Mood disorder ([place]) Much improved on higher dose bupropion   Meds ordered this encounter  Medications  . predniSONE (DELTASONE) 10 MG tablet    Sig: Take 6 on day 1, 5 on day 2, 4 on day 3, 3 on day 4, 2 on day 5 and 1 on day 1 then stop.    Dispense:  21 tablet    Refill:  0  . gabapentin (NEURONTIN) 300 MG capsule    Sig: Take 2 capsules (600 mg total) by mouth at bedtime.    Dispense:  60 capsule    Refill:  5    Partially dictated using Editor, commissioning. Any errors are unintentional.  Halina Maidens, MD [facility]  11/15/2017

## 2017-11-24 ENCOUNTER — Other Ambulatory Visit: Payer: Self-pay | Admitting: Internal Medicine

## 2017-11-24 DIAGNOSIS — N401 Enlarged prostate with lower urinary tract symptoms: Principal | ICD-10-CM

## 2017-11-24 DIAGNOSIS — N138 Other obstructive and reflux uropathy: Secondary | ICD-10-CM

## 2017-11-29 ENCOUNTER — Ambulatory Visit
Admission: RE | Admit: 2017-11-29 | Discharge: 2017-11-29 | Disposition: A | Discharge status: Home / Self Care | Payer: No Typology Code available for payment source | Source: Ambulatory Visit | Attending: Internal Medicine | Admitting: Internal Medicine

## 2017-11-29 ENCOUNTER — Encounter: Payer: Self-pay | Admitting: Internal Medicine

## 2017-11-29 ENCOUNTER — Ambulatory Visit (INDEPENDENT_AMBULATORY_CARE_PROVIDER_SITE_OTHER): Payer: No Typology Code available for payment source | Admitting: Internal Medicine

## 2017-11-29 VITALS — BP 128/74 | HR 68 | Ht 67.0 in | Wt 168.0 lb

## 2017-11-29 DIAGNOSIS — M5441 Lumbago with sciatica, right side: Secondary | ICD-10-CM

## 2017-11-29 MED ORDER — TRAMADOL HCL 50 MG PO TABS: 50.0000 mg | ORAL_TABLET | Freq: Four times a day (QID) | ORAL | 0 refills | Status: DC | PRN

## 2017-11-29 NOTE — Progress Notes (Signed)
Date:  11/29/2017   Name:  Paul Stokes   DOB:  04-Jan-1956   MRN:  [id number]   Chief Complaint: Sciatica (Getting worse. Finished prednisone. In the mornings nothing helps. Way worse in the morning. Last 2-3 days is unbearable. )  Back Pain  This is a new problem. The current episode started more than 1 month ago. The problem occurs constantly. The problem has been gradually worsening since onset. The pain is present in the lumbar spine. The pain radiates to the right foot. The pain is moderate. The symptoms are aggravated by sitting and twisting. Stiffness is present in the morning. Associated symptoms include leg pain. Pertinent negatives include no bladder incontinence, bowel incontinence, chest pain, fever, headaches, numbness, perianal numbness or weakness. Risk factors: he reports long hx of back issues treated with PT, chiropractor,etc but he is now so uncomfortable he can hardly work. He has tried NSAIDs and bed rest (gabapentin and prednisone) for the symptoms.     Review of Systems  Constitutional: Negative for chills, fatigue and fever.  Respiratory: Negative for chest tightness and shortness of breath.   Cardiovascular: Negative for chest pain.  Gastrointestinal: Negative for bowel incontinence.  Genitourinary: Negative for bladder incontinence and difficulty urinating.  Musculoskeletal: Positive for back pain and myalgias.  Neurological: Negative for dizziness, weakness, numbness and headaches.  Psychiatric/Behavioral: Positive for sleep disturbance (from pain).    Patient Active Problem List   Diagnosis Date Noted  . Mood disorder ([place]) 11/15/2016  . BPH with obstruction/lower urinary tract symptoms 06/15/2015  . Hyperlipidemia 06/15/2015  . Insomnia, persistent 03/06/2015  . Compulsive tobacco user syndrome 03/06/2015  . Type 1 diabetes mellitus with sensory neuropathy ([place]) 03/06/2015  . Presence of insulin pump 06/08/2014    Prior to Admission medications     Medication Sig Start Date End Date Taking? Authorizing Provider  aspirin 81 MG tablet Take 1 tablet by mouth daily.   Yes [provider]  azelastine (ASTELIN) 0.1 % nasal spray Place 1-2 sprays into both nostrils 2 (two) times daily. Use in each nostril as directed 10/26/15  Yes Glean Hess, MD  BAYER CONTOUR NEXT TEST test strip  01/01/15  Yes [provider]  buPROPion (WELLBUTRIN SR) 150 MG 12 hr tablet Take 1 tablet in the AM and 2 tablets at bedtime daily Patient taking differently: Take 2 tablet in the AM and 1 tablets at bedtime daily 09/04/17  Yes Glean Hess, MD  gabapentin (NEURONTIN) 300 MG capsule Take 2 capsules (600 mg total) by mouth at bedtime. 11/15/17  Yes Glean Hess, MD  insulin aspart (NOVOLOG) 100 UNIT/ML injection Inject into the skin. Via insulin pump   Yes [provider]  pravastatin (PRAVACHOL) 40 MG tablet Take 1 tablet by mouth daily.   Yes [provider]  tamsulosin (FLOMAX) 0.4 MG CAPS capsule TAKE 1 CAPSULE BY MOUTH AT BEDTIME 11/24/17  Yes Glean Hess, MD  VENTOLIN HFA 108 (90 Base) MCG/ACT inhaler 1-2 INHALATIONS EVERY 4-6 HOURS AS NEEDED FOR WHEEZING. DISPENSE SPACER AS NEEDED. 10/16/15  Yes [provider]  zolpidem (AMBIEN) 10 MG tablet TAKE 1 TABLET BY MOUTH AT BEDTIME 10/02/17  Yes Glean Hess, MD    Allergies  Allergen Reactions  . Clavulanic Acid Nausea And Vomiting  . Lisinopril Cough    Past Surgical History:  Procedure Laterality Date  . COLONOSCOPY  08/2013   7 tubular adenomas    Social History   Tobacco  Use  . Smoking status: Current Every Day Smoker    Packs/day: 1.00    Years: 47.00    Pack years: 47.00    Types: Cigarettes  . Smokeless tobacco: Never Used  . Tobacco comment: trying to cut back some  Substance Use Topics  . Alcohol use: No    Alcohol/week: 0.0 oz  . Drug use: No     Medication list has been reviewed and updated.  Current Meds  Medication  Sig  . aspirin 81 MG tablet Take 1 tablet by mouth daily.  Marland Kitchen azelastine (ASTELIN) 0.1 % nasal spray Place 1-2 sprays into both nostrils 2 (two) times daily. Use in each nostril as directed  . BAYER CONTOUR NEXT TEST test strip   . buPROPion (WELLBUTRIN SR) 150 MG 12 hr tablet Take 1 tablet in the AM and 2 tablets at bedtime daily (Patient taking differently: Take 2 tablet in the AM and 1 tablets at bedtime daily)  . gabapentin (NEURONTIN) 300 MG capsule Take 2 capsules (600 mg total) by mouth at bedtime.  . insulin aspart (NOVOLOG) 100 UNIT/ML injection Inject into the skin. Via insulin pump  . pravastatin (PRAVACHOL) 40 MG tablet Take 1 tablet by mouth daily.  . tamsulosin (FLOMAX) 0.4 MG CAPS capsule TAKE 1 CAPSULE BY MOUTH AT BEDTIME  . VENTOLIN HFA 108 (90 Base) MCG/ACT inhaler 1-2 INHALATIONS EVERY 4-6 HOURS AS NEEDED FOR WHEEZING. DISPENSE SPACER AS NEEDED.  Marland Kitchen zolpidem (AMBIEN) 10 MG tablet TAKE 1 TABLET BY MOUTH AT BEDTIME    PHQ 2/9 Scores 11/15/2017 11/15/2016  PHQ - 2 Score 0 0  PHQ- 9 Score 0 -    Physical Exam  Constitutional: He is oriented to person, place, and time. He appears well-developed. No distress.  HENT:  Head: Normocephalic and atraumatic.  Cardiovascular: Normal rate, regular rhythm and normal heart sounds.  Pulmonary/Chest: Effort normal and breath sounds normal. No respiratory distress.  Musculoskeletal: Normal range of motion.       Right hip: Normal.       Lumbar back: He exhibits bony tenderness. He exhibits no spasm.  Neurological: He is alert and oriented to person, place, and time. He has normal strength. Gait normal.  Reflex Scores:      Patellar reflexes are 1+ on the right side and 1+ on the left side.      Achilles reflexes are 1+ on the right side and 1+ on the left side. Skin: Skin is warm and dry. No rash noted.  Psychiatric: He has a normal mood and affect. His behavior is normal. Thought content normal.  Nursing note and vitals reviewed.   BP  128/74   Pulse 68   Ht 5\' 7"  (1.702 m)   Wt 168 lb (76.2 kg)   SpO2 97%   BMI 26.31 kg/m   Assessment and Plan: 1. Acute bilateral low back pain with right-sided sciatica Need referral to Ortho; plain films ordered Add tramadol for severe pain - traMADol (ULTRAM) 50 MG tablet; Take 1 tablet (50 mg total) by mouth every 6 (six) hours as needed.  Dispense: 20 tablet; Refill: 0 - Ambulatory referral to Orthopedic Surgery - DG Lumbar Spine Complete; Future   Meds ordered this encounter  Medications  . traMADol (ULTRAM) 50 MG tablet    Sig: Take 1 tablet (50 mg total) by mouth every 6 (six) hours as needed.    Dispense:  20 tablet    Refill:  0    Partially dictated using Dragon  software. Any errors are unintentional.  Halina Maidens, MD [facility]  11/29/2017   There are no diagnoses linked to this encounter.

## 2017-12-11 ENCOUNTER — Emergency Department
Admission: EM | Admit: 2017-12-11 | Discharge: 2017-12-11 | Disposition: A | Discharge status: Home / Self Care | Payer: PRIVATE HEALTH INSURANCE | Attending: Student in an Organized Health Care Education/Training Program | Admitting: Student in an Organized Health Care Education/Training Program

## 2017-12-11 ENCOUNTER — Encounter: Payer: Self-pay | Admitting: Emergency Medicine

## 2017-12-11 DIAGNOSIS — Z9641 Presence of insulin pump (external) (internal): Secondary | ICD-10-CM | POA: Diagnosis not present

## 2017-12-11 DIAGNOSIS — Z794 Long term (current) use of insulin: Secondary | ICD-10-CM | POA: Insufficient documentation

## 2017-12-11 DIAGNOSIS — F17210 Nicotine dependence, cigarettes, uncomplicated: Secondary | ICD-10-CM | POA: Insufficient documentation

## 2017-12-11 DIAGNOSIS — E109 Type 1 diabetes mellitus without complications: Secondary | ICD-10-CM | POA: Insufficient documentation

## 2017-12-11 DIAGNOSIS — M5441 Lumbago with sciatica, right side: Secondary | ICD-10-CM | POA: Insufficient documentation

## 2017-12-11 DIAGNOSIS — M5430 Sciatica, unspecified side: Secondary | ICD-10-CM

## 2017-12-11 DIAGNOSIS — Z7982 Long term (current) use of aspirin: Secondary | ICD-10-CM | POA: Insufficient documentation

## 2017-12-11 DIAGNOSIS — M545 Low back pain: Secondary | ICD-10-CM | POA: Diagnosis present

## 2017-12-11 HISTORY — DX: Type 2 diabetes mellitus without complications: E11.9

## 2017-12-11 NOTE — Discharge Instructions (Addendum)
Follow-up with your regular doctor if not improving with the medication regimen.  Return emergency department if worsening.  Apply ice to the sciatic area.

## 2017-12-11 NOTE — ED Provider Notes (Signed)
[facility] Emergency Department Provider Note  ____________________________________________   First MD Initiated Contact with Patient 12/11/17 1409     (approximate)  I have reviewed the triage vital signs and the nursing notes.   HISTORY  Chief Complaint Leg Pain    HPI TRI CHITTICK is a 62 y.o. male presents emergency department complaining of low back pain that radiates to the right leg for 3 months.  He states the pain in his foot has been worsening.  He was seen over at [facility] clinic by orthopedics and they are attempting to get an MRI.  There is a pending insurance approval for the MRI dated 12/08/2017.  Patient is upset stating that his back starts to hurt more in the morning but when he takes his muscle relaxers that it helps.  He does not want to use narcotics.  He has been taking Tylenol and ibuprofen together which do help.  He states by the end of the day after he has been on his feet his back is really hurting.  Past Medical History:  Diagnosis Date  . Diabetes mellitus without complication [facility])     Patient Active Problem List   Diagnosis Date Noted  . Acute bilateral low back pain with right-sided sciatica 11/29/2017  . Mood disorder ([place]) 11/15/2016  . BPH with obstruction/lower urinary tract symptoms 06/15/2015  . Hyperlipidemia 06/15/2015  . Insomnia, persistent 03/06/2015  . Compulsive tobacco user syndrome 03/06/2015  . Type 1 diabetes mellitus with sensory neuropathy ([place]) 03/06/2015  . Presence of insulin pump 06/08/2014    Past Surgical History:  Procedure Laterality Date  . COLONOSCOPY  08/2013   7 tubular adenomas    Prior to Admission medications   Medication Sig Start Date End Date Taking? Authorizing Provider  aspirin 81 MG tablet Take 1 tablet by mouth daily.    [provider]  azelastine (ASTELIN) 0.1 % nasal spray Place 1-2 sprays into both nostrils 2 (two) times daily. Use in each nostril as directed  10/26/15   Glean Hess, MD  BAYER CONTOUR NEXT TEST test strip  01/01/15   [provider]  buPROPion [facility] SR) 150 MG 12 hr tablet Take 1 tablet in the AM and 2 tablets at bedtime daily Patient taking differently: Take 2 tablet in the AM and 1 tablets at bedtime daily 09/04/17   Glean Hess, MD  gabapentin (NEURONTIN) 300 MG capsule Take 2 capsules (600 mg total) by mouth at bedtime. 11/15/17   Glean Hess, MD  insulin aspart (NOVOLOG) 100 UNIT/ML injection Inject into the skin. Via insulin pump    [provider]  pravastatin (PRAVACHOL) 40 MG tablet Take 1 tablet by mouth daily.    [provider]  tamsulosin (FLOMAX) 0.4 MG CAPS capsule TAKE 1 CAPSULE BY MOUTH AT BEDTIME 11/24/17   Glean Hess, MD  VENTOLIN HFA 108 (90 Base) MCG/ACT inhaler 1-2 INHALATIONS EVERY 4-6 HOURS AS NEEDED FOR WHEEZING. DISPENSE SPACER AS NEEDED. 10/16/15   [provider]  zolpidem (AMBIEN) 10 MG tablet TAKE 1 TABLET BY MOUTH AT BEDTIME 10/02/17   Glean Hess, MD    Allergies Clavulanic acid and Lisinopril  Family History  Problem Relation Age of Onset  . Diabetes Mother   . Diabetes Father     Social History Social History   Tobacco Use  . Smoking status: Current Every Day Smoker    Packs/day: 1.00    Years: 47.00  Pack years: 47.00    Types: Cigarettes  . Smokeless tobacco: Never Used  . Tobacco comment: trying to cut back some  Substance Use Topics  . Alcohol use: No    Alcohol/week: 0.0 oz  . Drug use: No    Review of Systems  Constitutional: No fever/chills Eyes: No visual changes. ENT: No sore throat. Respiratory: Denies cough Genitourinary: Negative for dysuria. Musculoskeletal: Positive for back pain radiates to the right leg Skin: Negative for rash.    ____________________________________________   PHYSICAL EXAM:  VITAL SIGNS: ED Triage Vitals  Enc Vitals Group     BP 12/11/17 1342 (!) 148/62     Pulse  Rate 12/11/17 1342 97     Resp 12/11/17 1342 20     Temp 12/11/17 1342 98 F (36.7 C)     Temp Source 12/11/17 1342 Oral     SpO2 12/11/17 1342 98 %     Weight 12/11/17 1346 168 lb (76.2 kg)     Height 12/11/17 1346 5\' 7"  (1.702 m)     Head Circumference --      Peak Flow --      Pain Score 12/11/17 1343 2     Pain Loc --      Pain Edu? --      Excl. in [place]? --     Constitutional: Alert and oriented. Well appearing and in no acute distress. Eyes: Conjunctivae are normal.  Head: Atraumatic. Nose: No congestion/rhinnorhea. Mouth/Throat: Mucous membranes are moist.   Cardiovascular: Normal rate, regular rhythm. Respiratory: Normal respiratory effort.  No retractions GU: deferred Musculoskeletal: FROM all extremities, warm and well perfused.  Lumbar spine is minimally tender.  The SI joint to the right is tender to palpation.  Patient does walk slowly.  He is able to stand without difficulty.  He has 10 out of 10 strength in the lower extremities including dorsiflexion of the great toes. Neurologic:  Normal speech and language.  Skin:  Skin is warm, dry and intact. No rash noted. Psychiatric: Mood and affect are normal. Speech and behavior are normal.  ____________________________________________   LABS (all labs ordered are listed, but only abnormal results are displayed)  Labs Reviewed - No data to display ____________________________________________   ____________________________________________  RADIOLOGY  Patient refused imaging today  ____________________________________________   PROCEDURES  Procedure(s) performed: No  Procedures    ____________________________________________   INITIAL IMPRESSION / ASSESSMENT AND PLAN / ED COURSE  Pertinent labs & imaging results that were available during my care of the patient were reviewed by me and considered in my medical decision making (see chart for details).  Patient is a 62 year old male presents emergency  department complaining of chronic low back pain with radiation to the right leg.  States pain has become worse over the past few weeks.  He has been on prednisone twice and muscle relaxers.  He does not want narcotics as he states Tylenol and ibuprofen still help.  However he is upset that in the mornings when he gets up his back is worse.  He states it takes about an hour and a half for the muscle relaxer to get in his system.  He takes his last muscle relaxer around 8 or 9:00 at night and he gets up around 6.  On physical exam the patient has minimal tenderness to the spine.  The right SI joint is tender.  Neurovascular is intact.  He has 10 out of 10 strength in the lower extremities.  Remainder the  exam is unremarkable  Explained the findings to the patient.  Told him that I knew by the epic documentation that a preapproval has been sent for his MRI.  Explained to him that if we do an MRI from the emergency department that he will probably be here till 5 or 6:00 waiting for the MRI as they do outpatient for since this is not emergent.  He states he will go home and try taking the medication earlier as I had stated.  He will follow-up with his regular doctor.  Return emergency department if worsening.  Was discharged in stable condition.     As part of my medical decision making, I reviewed the following data within the [place] notes reviewed and incorporated, Old chart reviewed, Notes from prior ED visits and  Controlled Substance Database  ____________________________________________   FINAL CLINICAL IMPRESSION(S) / ED DIAGNOSES  Final diagnoses:  Sciatica, unspecified laterality      NEW MEDICATIONS STARTED DURING THIS VISIT:  Discharge Medication List as of 12/11/2017  2:24 PM       Note:  This document was prepared using Dragon voice recognition software and may include unintentional dictation errors.    Versie Starks, PA-C 12/11/17 1448      Merlyn Lot, MD 12/11/17 [phone]

## 2017-12-11 NOTE — ED Triage Notes (Signed)
Pt arrived from [facility]. Pt has had pain in his right leg for the last 3 months and has been given muscle relaxant. Pt states PCP won't prescribe any more pain medication until an MRI is performed. PT states he hasn't been able to schedule an MRI due to insurance but can not longer tolerate the pain. PT denies any new injury

## 2017-12-14 ENCOUNTER — Other Ambulatory Visit: Payer: Self-pay | Admitting: Sports Medicine

## 2017-12-14 DIAGNOSIS — M5417 Radiculopathy, lumbosacral region: Secondary | ICD-10-CM

## 2017-12-21 ENCOUNTER — Ambulatory Visit
Admission: RE | Admit: 2017-12-21 | Discharge: 2017-12-21 | Disposition: A | Discharge status: Home / Self Care | Payer: No Typology Code available for payment source | Source: Ambulatory Visit | Attending: Sports Medicine | Admitting: Sports Medicine

## 2017-12-21 DIAGNOSIS — M5116 Intervertebral disc disorders with radiculopathy, lumbar region: Secondary | ICD-10-CM | POA: Insufficient documentation

## 2017-12-21 DIAGNOSIS — M48061 Spinal stenosis, lumbar region without neurogenic claudication: Secondary | ICD-10-CM | POA: Insufficient documentation

## 2017-12-21 DIAGNOSIS — M79604 Pain in right leg: Secondary | ICD-10-CM | POA: Insufficient documentation

## 2017-12-21 DIAGNOSIS — M5417 Radiculopathy, lumbosacral region: Secondary | ICD-10-CM | POA: Diagnosis present

## 2018-01-18 ENCOUNTER — Encounter: Payer: No Typology Code available for payment source | Admitting: Internal Medicine

## 2018-01-31 ENCOUNTER — Other Ambulatory Visit: Payer: Self-pay | Admitting: Internal Medicine

## 2018-01-31 DIAGNOSIS — F39 Unspecified mood [affective] disorder: Secondary | ICD-10-CM

## 2018-02-13 ENCOUNTER — Other Ambulatory Visit: Payer: Self-pay | Admitting: Internal Medicine

## 2018-03-12 LAB — HEMOGLOBIN A1C: Hemoglobin A1C: 8.7

## 2018-03-13 ENCOUNTER — Other Ambulatory Visit: Payer: Self-pay | Admitting: Internal Medicine

## 2018-05-08 ENCOUNTER — Other Ambulatory Visit: Payer: Self-pay | Admitting: Internal Medicine

## 2018-05-08 DIAGNOSIS — M5431 Sciatica, right side: Secondary | ICD-10-CM

## 2018-06-05 ENCOUNTER — Telehealth: Payer: Self-pay

## 2018-06-05 ENCOUNTER — Ambulatory Visit (INDEPENDENT_AMBULATORY_CARE_PROVIDER_SITE_OTHER): Payer: 59 | Admitting: Internal Medicine

## 2018-06-05 ENCOUNTER — Encounter: Payer: Self-pay | Admitting: Internal Medicine

## 2018-06-05 ENCOUNTER — Other Ambulatory Visit: Payer: Self-pay | Admitting: Internal Medicine

## 2018-06-05 VITALS — BP 120/66 | HR 93 | Ht 67.0 in | Wt 161.0 lb

## 2018-06-05 DIAGNOSIS — M19041 Primary osteoarthritis, right hand: Secondary | ICD-10-CM

## 2018-06-05 DIAGNOSIS — M65331 Trigger finger, right middle finger: Secondary | ICD-10-CM | POA: Diagnosis not present

## 2018-06-05 DIAGNOSIS — M65342 Trigger finger, left ring finger: Secondary | ICD-10-CM

## 2018-06-05 DIAGNOSIS — F39 Unspecified mood [affective] disorder: Secondary | ICD-10-CM

## 2018-06-05 DIAGNOSIS — M19042 Primary osteoarthritis, left hand: Secondary | ICD-10-CM | POA: Diagnosis not present

## 2018-06-05 NOTE — Patient Instructions (Signed)
Begin Tylenol 2 tablets twice a day for arthritis pain in both hands

## 2018-06-05 NOTE — Telephone Encounter (Signed)
Did PA on patient medication for buPROPion (WELLBUTRIN) TID on covermymeds.com.   (Key: [id number])   Website says:  [employer] is reviewing your PA request and will respond within 24 hours for Medicaid or up to 72 hours for non-Medicaid plans, based on the required timeframe determined by state or federal regulations  Awaiting outcome for PA.

## 2018-06-05 NOTE — Progress Notes (Signed)
Date:  06/05/2018   Name:  Paul Stokes   DOB:  [birth year]   MRN:  [id number]   Chief Complaint: Hand Pain (Both hands hurting but right is worst. First two fingers ache constantly. Hard to grip. First finger gets stuck in a curved poition in the morning and have to MAKE finger move back straight. X 2 months)  Hand Pain   There was no injury mechanism. The pain is present in the right hand and left hand. The quality of the pain is described as cramping (and triggering in several fingers). The pain is mild. The pain has been fluctuating since the incident. Pertinent negatives include no chest pain, muscle weakness, numbness or tingling. The symptoms are aggravated by lifting. He has tried nothing for the symptoms.    Review of Systems  Constitutional: Negative for chills and fatigue.  Respiratory: Negative for chest tightness and shortness of breath.   Cardiovascular: Negative for chest pain.  Musculoskeletal: Positive for arthralgias.  Neurological: Negative for tingling, weakness and numbness.    Patient Active Problem List   Diagnosis Date Noted  . Acute bilateral low back pain with right-sided sciatica 11/29/2017  . Mood disorder ([place]) 11/15/2016  . BPH with obstruction/lower urinary tract symptoms 06/15/2015  . Hyperlipidemia 06/15/2015  . Insomnia, persistent 03/06/2015  . Compulsive tobacco user syndrome 03/06/2015  . Type 1 diabetes mellitus with sensory neuropathy ([place]) 03/06/2015  . Presence of insulin pump 06/08/2014    Allergies  Allergen Reactions  . Clavulanic Acid Nausea And Vomiting  . Lisinopril Cough    Past Surgical History:  Procedure Laterality Date  . COLONOSCOPY  08/2013   7 tubular adenomas    Social History   Tobacco Use  . Smoking status: Current Every Day Smoker    Packs/day: 1.00    Years: 47.00    Pack years: 47.00    Types: Cigarettes  . Smokeless tobacco: Never Used  . Tobacco comment: trying to cut back some  Substance Use Topics    . Alcohol use: No    Alcohol/week: 0.0 standard drinks  . Drug use: No     Medication list has been reviewed and updated.  Current Meds  Medication Sig  . aspirin 81 MG tablet Take 1 tablet by mouth daily.  Marland Kitchen azelastine (ASTELIN) 0.1 % nasal spray Place 1-2 sprays into both nostrils 2 (two) times daily. Use in each nostril as directed  . BAYER CONTOUR NEXT TEST test strip   . buPROPion (WELLBUTRIN SR) 150 MG 12 hr tablet TAKE ONE TABLET EVERY MORNING AND TAKE TWO TABLETS AT BEDTIME  . gabapentin (NEURONTIN) 300 MG capsule TAKE 2 CAPSULES BY MOUTH AT BEDTIME  . insulin aspart (NOVOLOG) 100 UNIT/ML injection Inject into the skin. Via insulin pump  . pravastatin (PRAVACHOL) 40 MG tablet Take 1 tablet by mouth daily.  . tamsulosin (FLOMAX) 0.4 MG CAPS capsule TAKE 1 CAPSULE BY MOUTH AT BEDTIME  . VENTOLIN HFA 108 (90 Base) MCG/ACT inhaler 1-2 INHALATIONS EVERY 4-6 HOURS AS NEEDED FOR WHEEZING. DISPENSE SPACER AS NEEDED.  Marland Kitchen zolpidem (AMBIEN) 10 MG tablet TAKE ONE TABLET BY MOUTH AT BEDTIME    PHQ 2/9 Scores 06/05/2018 11/15/2017 11/15/2016  PHQ - 2 Score 0 0 0  PHQ- 9 Score - 0 -    Physical Exam Vitals signs and nursing note reviewed.  Constitutional:      General: He is not in acute distress.    Appearance: He is well-developed.  HENT:  Head: Normocephalic and atraumatic.  Pulmonary:     Effort: Pulmonary effort is normal. No respiratory distress.  Musculoskeletal:     Comments: Thickening of palmar fascia base of index and second finger on right and base of ring finger on left  Mild OA changes of the PIP joints of both hands  Skin:    General: Skin is warm and dry.     Findings: No rash.  Neurological:     Mental Status: He is alert and oriented to person, place, and time.  Psychiatric:        Behavior: Behavior normal.        Thought Content: Thought content normal.     BP 120/66   Pulse 93   Ht 5\' 7"  (1.702 m)   Wt 161 lb (73 kg)   SpO2 98%   BMI 25.22 kg/m    Assessment and Plan: 1. Trigger middle finger of right hand - Ambulatory referral to Orthopedic Surgery  2. Trigger ring finger of left hand - Ambulatory referral to Orthopedic Surgery  3. Primary osteoarthritis of both hands Begin tylenol 2 tabs twice a day   Partially dictated using Editor, commissioning. Any errors are unintentional.  Halina Maidens, MD [facility]  06/05/2018

## 2018-06-08 NOTE — Telephone Encounter (Signed)
Paul Stokes KeyRomeo Rabon - PA Case ID: [id number] - Rx #: [id number]    Outcome :   Approved!!  [id number];  Coverage :05/06/2018- 06/04/2021

## 2018-06-19 DIAGNOSIS — M65321 Trigger finger, right index finger: Secondary | ICD-10-CM | POA: Diagnosis not present

## 2018-06-19 DIAGNOSIS — M65331 Trigger finger, right middle finger: Secondary | ICD-10-CM | POA: Diagnosis not present

## 2018-06-28 DIAGNOSIS — E1040 Type 1 diabetes mellitus with diabetic neuropathy, unspecified: Secondary | ICD-10-CM | POA: Diagnosis not present

## 2018-06-28 DIAGNOSIS — E1065 Type 1 diabetes mellitus with hyperglycemia: Secondary | ICD-10-CM | POA: Diagnosis not present

## 2018-07-05 ENCOUNTER — Other Ambulatory Visit: Payer: Self-pay | Admitting: Internal Medicine

## 2018-07-05 DIAGNOSIS — F39 Unspecified mood [affective] disorder: Secondary | ICD-10-CM

## 2018-07-09 DIAGNOSIS — H04123 Dry eye syndrome of bilateral lacrimal glands: Secondary | ICD-10-CM | POA: Diagnosis not present

## 2018-07-09 DIAGNOSIS — E089 Diabetes mellitus due to underlying condition without complications: Secondary | ICD-10-CM | POA: Diagnosis not present

## 2018-07-09 DIAGNOSIS — E119 Type 2 diabetes mellitus without complications: Secondary | ICD-10-CM | POA: Diagnosis not present

## 2018-07-26 ENCOUNTER — Encounter: Payer: No Typology Code available for payment source | Admitting: Internal Medicine

## 2018-07-26 DIAGNOSIS — M5126 Other intervertebral disc displacement, lumbar region: Secondary | ICD-10-CM | POA: Diagnosis not present

## 2018-07-26 DIAGNOSIS — M5416 Radiculopathy, lumbar region: Secondary | ICD-10-CM | POA: Diagnosis not present

## 2018-08-06 ENCOUNTER — Ambulatory Visit (INDEPENDENT_AMBULATORY_CARE_PROVIDER_SITE_OTHER): Payer: 59 | Admitting: Internal Medicine

## 2018-08-06 ENCOUNTER — Other Ambulatory Visit: Payer: Self-pay

## 2018-08-06 ENCOUNTER — Encounter: Payer: Self-pay | Admitting: Internal Medicine

## 2018-08-06 VITALS — BP 112/78 | HR 89 | Resp 16 | Ht 67.0 in | Wt 164.0 lb

## 2018-08-06 DIAGNOSIS — E1040 Type 1 diabetes mellitus with diabetic neuropathy, unspecified: Secondary | ICD-10-CM

## 2018-08-06 DIAGNOSIS — N401 Enlarged prostate with lower urinary tract symptoms: Secondary | ICD-10-CM

## 2018-08-06 DIAGNOSIS — Z0000 Encounter for general adult medical examination without abnormal findings: Secondary | ICD-10-CM | POA: Diagnosis not present

## 2018-08-06 DIAGNOSIS — R1013 Epigastric pain: Secondary | ICD-10-CM | POA: Insufficient documentation

## 2018-08-06 DIAGNOSIS — M5441 Lumbago with sciatica, right side: Secondary | ICD-10-CM

## 2018-08-06 DIAGNOSIS — F39 Unspecified mood [affective] disorder: Secondary | ICD-10-CM | POA: Diagnosis not present

## 2018-08-06 DIAGNOSIS — G4700 Insomnia, unspecified: Secondary | ICD-10-CM

## 2018-08-06 DIAGNOSIS — E782 Mixed hyperlipidemia: Secondary | ICD-10-CM

## 2018-08-06 DIAGNOSIS — F17200 Nicotine dependence, unspecified, uncomplicated: Secondary | ICD-10-CM

## 2018-08-06 DIAGNOSIS — N138 Other obstructive and reflux uropathy: Secondary | ICD-10-CM

## 2018-08-06 LAB — POCT URINALYSIS DIPSTICK
Bilirubin, UA: NEGATIVE
Glucose, UA: POSITIVE — AB
Ketones, UA: NEGATIVE
Leukocytes, UA: NEGATIVE
Nitrite, UA: NEGATIVE
PROTEIN UA: NEGATIVE
RBC UA: NEGATIVE
SPEC GRAV UA: 1.01 (ref 1.010–1.025)
Urobilinogen, UA: 0.2 E.U./dL
pH, UA: 6 (ref 5.0–8.0)

## 2018-08-06 MED ORDER — BUPROPION HCL ER (SR) 150 MG PO TB12: ORAL_TABLET | ORAL | 5 refills | Status: DC

## 2018-08-06 MED ORDER — FAMOTIDINE 20 MG PO TABS: 20.0000 mg | ORAL_TABLET | Freq: Two times a day (BID) | ORAL | 2 refills | Status: DC

## 2018-08-06 MED ORDER — VENTOLIN HFA 108 (90 BASE) MCG/ACT IN AERS: 2.0000 | INHALATION_SPRAY | Freq: Four times a day (QID) | RESPIRATORY_TRACT | 1 refills | Status: DC | PRN

## 2018-08-06 NOTE — Progress Notes (Signed)
Date:  08/06/2018   Name:  Paul Stokes   DOB:  July 06, 1955   MRN:  [id number]   Chief Complaint: Annual Exam; Abdominal Pain (right below ribs- has had before when gall bladder acted up. ); and Diabetes (BS 253 today avg  400-600) Paul Stokes is a 63 y.o. male who presents today for his Complete Annual Exam. He feels poorly. He reports exercising none. He reports he is sleeping fairly well on Ambien and only has sleep eating if he takes [place] and does not go immediately to bed. He is not working - on unemployment since the Covid19 outbreak but he has decided to go ahead an retire.  He admits not taking care of himself, glucoses are often up to 600.  He is not eating as well and not exercising due to back pain from HNP.  He is not depressed but does feel down and unmotivated to improve.  Diabetes  He presents for his follow-up diabetic visit. He has type 1 diabetes mellitus. His disease course has been fluctuating (followed by Endocrinology). Pertinent negatives for hypoglycemia include no dizziness or headaches. Pertinent negatives for diabetes include no chest pain, no fatigue, no weakness and no weight loss. Symptoms are worsening. Current diabetic treatment includes insulin pump. His weight is stable. An ACE inhibitor/angiotensin II receptor blocker is not being taken. Eye exam is not current.  Hyperlipidemia  This is a chronic problem. The problem is controlled. Pertinent negatives include no chest pain, myalgias or shortness of breath. Current antihyperlipidemic treatment includes statins. The current treatment provides significant improvement of lipids.  Depression         This is a chronic problem.  The problem has been resolved since onset.  Associated symptoms include insomnia.  Associated symptoms include no fatigue, no appetite change, no myalgias and no headaches.  Past treatments include other medications.  Compliance with treatment is good. Benign Prostatic Hypertrophy  This is a  chronic problem. The problem is unchanged. Irritative symptoms do not include frequency. Pertinent negatives include no chills, dysuria, nausea or vomiting.  Insomnia  Primary symptoms: sleep disturbance.  The problem occurs nightly. The symptoms are relieved by medication (he has taken Ambien for years but thinks he may be sleep eating). The treatment provided significant relief. PMH includes: depression.  Back Pain  This is a new problem. The current episode started more than 1 month ago. The problem occurs daily. The pain is present in the lumbar spine. The pain radiates to the right foot. The pain is moderate. Associated symptoms include abdominal pain (cramping in upper abdomen) and numbness (in both feet from DM). Pertinent negatives include no chest pain, dysuria, headaches, weakness or weight loss. He has tried NSAIDs (getting ESI from Dr. Sharlet Salina) for the symptoms. The treatment provided moderate relief.  Abdominal Pain  This is a new problem. The current episode started 1 to 4 weeks ago. The problem occurs daily. The problem has been unchanged. The pain is located in the epigastric region. The pain is mild. The quality of the pain is cramping and a sensation of fullness. The abdominal pain does not radiate. Associated symptoms include belching. Pertinent negatives include no arthralgias, constipation, diarrhea, dysuria, frequency, headaches, myalgias, nausea, vomiting or weight loss. Nothing aggravates the pain. The pain is relieved by nothing.    Review of Systems  Constitutional: Negative for appetite change, chills, diaphoresis, fatigue, unexpected weight change and weight loss.  HENT: Negative for hearing loss, tinnitus, trouble swallowing  and voice change.   Eyes: Negative for visual disturbance.  Respiratory: Negative for choking, shortness of breath and wheezing.   Cardiovascular: Negative for chest pain, palpitations and leg swelling.  Gastrointestinal: Positive for abdominal pain  (cramping in upper abdomen). Negative for blood in stool, constipation, diarrhea, nausea and vomiting.  Genitourinary: Negative for difficulty urinating, dysuria and frequency.  Musculoskeletal: Positive for back pain. Negative for arthralgias and myalgias.  Skin: Negative for color change and rash.  Allergic/Immunologic: Positive for environmental allergies.  Neurological: Positive for numbness (in both feet from DM). Negative for dizziness, syncope, weakness and headaches.  Hematological: Negative for adenopathy.  Psychiatric/Behavioral: Positive for depression, dysphoric mood and sleep disturbance. The patient has insomnia.     Patient Active Problem List   Diagnosis Date Noted  . Acute bilateral low back pain with right-sided sciatica 11/29/2017  . Mood disorder ([place]) 11/15/2016  . BPH with obstruction/lower urinary tract symptoms 06/15/2015  . Hyperlipidemia 06/15/2015  . Insomnia, persistent 03/06/2015  . Compulsive tobacco user syndrome 03/06/2015  . Type 1 diabetes mellitus with sensory neuropathy ([place]) 03/06/2015  . Presence of insulin pump 06/08/2014    Allergies  Allergen Reactions  . Clavulanic Acid Nausea And Vomiting  . Lisinopril Cough    Past Surgical History:  Procedure Laterality Date  . COLONOSCOPY  08/2013   7 tubular adenomas    Social History   Tobacco Use  . Smoking status: Current Every Day Smoker    Packs/day: 0.50    Years: 47.00    Pack years: 23.50    Types: Cigarettes  . Smokeless tobacco: Never Used  . Tobacco comment: trying to cut back some  Substance Use Topics  . Alcohol use: No    Alcohol/week: 0.0 standard drinks  . Drug use: No     Medication list has been reviewed and updated.  Current Meds  Medication Sig  . aspirin 81 MG tablet Take 1 tablet by mouth daily.  Marland Kitchen buPROPion (WELLBUTRIN SR) 150 MG 12 hr tablet TAKE 1 TABLET BY MOUTH EVERY MORNING AND2 TABLETS AT BEDTIME  . gabapentin (NEURONTIN) 300 MG capsule TAKE 2 CAPSULES  BY MOUTH AT BEDTIME  . insulin aspart (NOVOLOG) 100 UNIT/ML injection Inject into the skin. Via insulin pump  . pravastatin (PRAVACHOL) 40 MG tablet Take 1 tablet by mouth daily.  . tamsulosin (FLOMAX) 0.4 MG CAPS capsule TAKE 1 CAPSULE BY MOUTH AT BEDTIME  . VENTOLIN HFA 108 (90 Base) MCG/ACT inhaler 1-2 INHALATIONS EVERY 4-6 HOURS AS NEEDED FOR WHEEZING. DISPENSE SPACER AS NEEDED.  Marland Kitchen zolpidem (AMBIEN) 10 MG tablet TAKE ONE TABLET BY MOUTH AT BEDTIME  . [DISCONTINUED] azelastine (ASTELIN) 0.1 % nasal spray Place 1-2 sprays into both nostrils 2 (two) times daily. Use in each nostril as directed    PHQ 2/9 Scores 08/06/2018 06/05/2018 11/15/2017 11/15/2016  PHQ - 2 Score 2 0 0 0  PHQ- 9 Score 11 - 0 -   6CIT Screen 08/06/2018  What Year? 0 points  What month? 0 points  What time? 0 points  Count back from 20 0 points  Months in reverse 0 points  Repeat phrase 0 points  Total Score 0      Physical Exam Vitals signs and nursing note reviewed.  Constitutional:      Appearance: Normal appearance. He is well-developed.  HENT:     Head: Normocephalic.     Right Ear: Tympanic membrane, ear canal and external ear normal.     Left Ear: Tympanic  membrane, ear canal and external ear normal.     Nose: Nose normal.     Mouth/Throat:     Pharynx: Uvula midline.  Eyes:     Conjunctiva/sclera: Conjunctivae normal.     Pupils: Pupils are equal, round, and reactive to light.  Neck:     Musculoskeletal: Normal range of motion and neck supple.     Thyroid: No thyromegaly.     Vascular: No carotid bruit.  Cardiovascular:     Rate and Rhythm: Normal rate and regular rhythm.     Heart sounds: Normal heart sounds.  Pulmonary:     Effort: Pulmonary effort is normal.     Breath sounds: Normal breath sounds. No wheezing.  Chest:     Breasts:        Right: No mass.        Left: No mass.  Abdominal:     General: Bowel sounds are normal.     Palpations: Abdomen is soft.     Tenderness: There is no  abdominal tenderness.  Musculoskeletal: Normal range of motion.  Lymphadenopathy:     Cervical: No cervical adenopathy.  Skin:    General: Skin is warm and dry.  Neurological:     Mental Status: He is alert and oriented to person, place, and time.     Cranial Nerves: Cranial nerves are intact.     Sensory: Sensory deficit present.     Motor: Motor function is intact.     Deep Tendon Reflexes: Reflexes are normal and symmetric.  Psychiatric:        Speech: Speech normal.        Behavior: Behavior normal.        Thought Content: Thought content normal.        Judgment: Judgment normal.     Wt Readings from Last 3 Encounters:  08/06/18 164 lb (74.4 kg)  06/05/18 161 lb (73 kg)  12/11/17 168 lb (76.2 kg)    BP 112/78   Pulse 89   Resp 16   Ht 5\' 7"  (1.702 m)   Wt 164 lb (74.4 kg)   SpO2 98%   BMI 25.69 kg/m   Assessment and Plan: 1. Annual physical exam Normal exam but needs to work on diet, exercise and DM Long discussion about improving his health overall, now that he has time and is not working - POCT urinalysis dipstick  2. Type 1 diabetes mellitus with sensory neuropathy (HCC) Not controlled Follow up with Endo ASAP - CBC with Differential/Platelet - Comprehensive metabolic panel - Hemoglobin A1c - Microalbumin / creatinine urine ratio - cancelled as pt unable to void  3. BPH with obstruction/lower urinary tract symptoms Check PSA, continue flomax - PSA  4. Mixed hyperlipidemia On statin therapy - Lipid panel  5. Mood disorder ([place]) Fair control with some recent social issues He will follow up if sx worsen - buPROPion (WELLBUTRIN SR) 150 MG 12 hr tablet; TAKE 1 TABLET BY MOUTH EVERY MORNING AND2 TABLETS AT BEDTIME  Dispense: 90 tablet; Refill: 5  6. Insomnia, persistent Continue Ambien  7. Dyspepsia Start H2 blocker for one month then stop if sx controlled - famotidine (PEPCID) 20 MG tablet; Take 1 tablet (20 mg total) by mouth 2 (two) times daily.   Dispense: 60 tablet; Refill: 2  8. Acute bilateral low back pain with right-sided sciatica Continue follow up with pain management  9. Compulsive tobacco user syndrome Working on cutting back; now 1/2 ppd   Partially dictated using  Editor, commissioning. Any errors are unintentional.  Halina Maidens, MD [facility]  08/06/2018

## 2018-08-07 LAB — CBC WITH DIFFERENTIAL/PLATELET
BASOS: 2 %
Basophils Absolute: 0.1 10*3/uL (ref 0.0–0.2)
EOS (ABSOLUTE): 0.2 10*3/uL (ref 0.0–0.4)
EOS: 3 %
Hematocrit: 46.8 % (ref 37.5–51.0)
Hemoglobin: 15.1 g/dL (ref 13.0–17.7)
IMMATURE GRANULOCYTES: 1 %
Immature Grans (Abs): 0.1 10*3/uL (ref 0.0–0.1)
LYMPHS: 36 %
Lymphocytes Absolute: 2.6 10*3/uL (ref 0.7–3.1)
MCH: 29.5 pg (ref 26.6–33.0)
MCHC: 32.3 g/dL (ref 31.5–35.7)
MCV: 92 fL (ref 79–97)
Monocytes Absolute: 0.6 10*3/uL (ref 0.1–0.9)
Monocytes: 9 %
NEUTROS ABS: 3.6 10*3/uL (ref 1.4–7.0)
NEUTROS PCT: 49 %
PLATELETS: 293 10*3/uL (ref 150–450)
RBC: 5.11 x10E6/uL (ref 4.14–5.80)
RDW: 11.7 % (ref 11.6–15.4)
WBC: 7.2 10*3/uL (ref 3.4–10.8)

## 2018-08-07 LAB — COMPREHENSIVE METABOLIC PANEL
ALT: 33 IU/L (ref 0–44)
AST: 18 IU/L (ref 0–40)
Albumin/Globulin Ratio: 1.9 (ref 1.2–2.2)
Albumin: 4.2 g/dL (ref 3.8–4.8)
Alkaline Phosphatase: 88 IU/L (ref 39–117)
BILIRUBIN TOTAL: 0.4 mg/dL (ref 0.0–1.2)
BUN / CREAT RATIO: 13 (ref 10–24)
BUN: 14 mg/dL (ref 8–27)
CHLORIDE: 98 mmol/L (ref 96–106)
CO2: 25 mmol/L (ref 20–29)
Calcium: 9.7 mg/dL (ref 8.6–10.2)
Creatinine, Ser: 1.11 mg/dL (ref 0.76–1.27)
GFR, EST AFRICAN AMERICAN: 81 mL/min/{1.73_m2} (ref >59)
GFR, EST NON AFRICAN AMERICAN: 70 mL/min/{1.73_m2} (ref >59)
GLOBULIN, TOTAL: 2.2 g/dL (ref 1.5–4.5)
Glucose: 198 mg/dL — ABNORMAL HIGH (ref 65–99)
POTASSIUM: 4.8 mmol/L (ref 3.5–5.2)
SODIUM: 139 mmol/L (ref 134–144)
TOTAL PROTEIN: 6.4 g/dL (ref 6.0–8.5)

## 2018-08-07 LAB — LIPID PANEL
CHOLESTEROL TOTAL: 115 mg/dL (ref 100–199)
Chol/HDL Ratio: 2.3 ratio (ref 0.0–5.0)
HDL: 49 mg/dL (ref >39)
LDL Calculated: 54 mg/dL (ref 0–99)
TRIGLYCERIDES: 60 mg/dL (ref 0–149)
VLDL Cholesterol Cal: 12 mg/dL (ref 5–40)

## 2018-08-07 LAB — HEMOGLOBIN A1C
Est. average glucose Bld gHb Est-mCnc: 246 mg/dL
Hgb A1c MFr Bld: 10.2 % — ABNORMAL HIGH (ref 4.8–5.6)

## 2018-08-07 LAB — PSA: Prostate Specific Ag, Serum: 3.1 ng/mL (ref 0.0–4.0)

## 2018-09-21 DIAGNOSIS — E1065 Type 1 diabetes mellitus with hyperglycemia: Secondary | ICD-10-CM | POA: Diagnosis not present

## 2018-09-21 DIAGNOSIS — E1040 Type 1 diabetes mellitus with diabetic neuropathy, unspecified: Secondary | ICD-10-CM | POA: Diagnosis not present

## 2018-09-28 ENCOUNTER — Other Ambulatory Visit: Payer: Self-pay | Admitting: Internal Medicine

## 2018-10-17 ENCOUNTER — Other Ambulatory Visit: Payer: Self-pay

## 2018-10-17 MED ORDER — VENTOLIN HFA 108 (90 BASE) MCG/ACT IN AERS: 2.0000 | INHALATION_SPRAY | Freq: Four times a day (QID) | RESPIRATORY_TRACT | 1 refills | Status: DC | PRN

## 2018-10-22 ENCOUNTER — Other Ambulatory Visit: Payer: Self-pay

## 2018-10-22 ENCOUNTER — Telehealth: Payer: Self-pay

## 2018-10-22 DIAGNOSIS — F39 Unspecified mood [affective] disorder: Secondary | ICD-10-CM

## 2018-10-22 DIAGNOSIS — N138 Other obstructive and reflux uropathy: Secondary | ICD-10-CM

## 2018-10-22 MED ORDER — TAMSULOSIN HCL 0.4 MG PO CAPS: 0.4000 mg | ORAL_CAPSULE | Freq: Every day | ORAL | 1 refills | Status: DC

## 2018-10-22 MED ORDER — PRAVASTATIN SODIUM 40 MG PO TABS: 40.0000 mg | ORAL_TABLET | Freq: Every day | ORAL | 1 refills | Status: DC

## 2018-10-22 MED ORDER — BUPROPION HCL ER (SR) 150 MG PO TB12: ORAL_TABLET | ORAL | 1 refills | Status: DC

## 2018-10-22 NOTE — Telephone Encounter (Signed)
Sent in medications. Patient informed. Did not send in insulin pump as this comes from Dr. Gabriel Carina. He is informed and will call endo for RF.

## 2018-10-22 NOTE — Telephone Encounter (Signed)
Patient has new pharmacy and said they sent slip but [facility] has not heard back from [place]. He needs Wellbutrin, Pravastatin, Insulin, and Tamsulosin. No need to call him if okay to refill.

## 2018-10-24 ENCOUNTER — Other Ambulatory Visit: Payer: Self-pay

## 2018-10-24 DIAGNOSIS — F39 Unspecified mood [affective] disorder: Secondary | ICD-10-CM

## 2018-10-24 DIAGNOSIS — M5431 Sciatica, right side: Secondary | ICD-10-CM

## 2018-10-24 DIAGNOSIS — R1013 Epigastric pain: Secondary | ICD-10-CM

## 2018-10-24 MED ORDER — GABAPENTIN 300 MG PO CAPS: 600.0000 mg | ORAL_CAPSULE | Freq: Every day | ORAL | 1 refills | Status: DC

## 2018-10-24 MED ORDER — FAMOTIDINE 20 MG PO TABS: 20.0000 mg | ORAL_TABLET | Freq: Two times a day (BID) | ORAL | 1 refills | Status: DC

## 2018-10-24 MED ORDER — BUPROPION HCL ER (SR) 150 MG PO TB12: ORAL_TABLET | ORAL | 1 refills | Status: DC

## 2019-01-16 ENCOUNTER — Other Ambulatory Visit: Payer: Self-pay | Admitting: Internal Medicine

## 2019-01-16 DIAGNOSIS — M5431 Sciatica, right side: Secondary | ICD-10-CM

## 2019-02-12 ENCOUNTER — Other Ambulatory Visit: Payer: Self-pay

## 2019-02-12 DIAGNOSIS — M5431 Sciatica, right side: Secondary | ICD-10-CM

## 2019-02-12 DIAGNOSIS — F39 Unspecified mood [affective] disorder: Secondary | ICD-10-CM

## 2019-02-12 MED ORDER — BUPROPION HCL ER (SR) 150 MG PO TB12: ORAL_TABLET | ORAL | 1 refills | Status: DC

## 2019-02-12 MED ORDER — GABAPENTIN 300 MG PO CAPS: 600.0000 mg | ORAL_CAPSULE | Freq: Every day | ORAL | 1 refills | Status: DC

## 2019-02-15 ENCOUNTER — Other Ambulatory Visit: Payer: Self-pay | Admitting: Internal Medicine

## 2019-02-15 DIAGNOSIS — G4700 Insomnia, unspecified: Secondary | ICD-10-CM

## 2019-02-15 MED ORDER — ZOLPIDEM TARTRATE 10 MG PO TABS: 10.0000 mg | ORAL_TABLET | Freq: Every day | ORAL | 1 refills | Status: DC

## 2019-02-18 ENCOUNTER — Other Ambulatory Visit: Payer: Self-pay

## 2019-02-18 MED ORDER — VENTOLIN HFA 108 (90 BASE) MCG/ACT IN AERS: 2.0000 | INHALATION_SPRAY | Freq: Four times a day (QID) | RESPIRATORY_TRACT | 1 refills | Status: DC | PRN

## 2019-02-22 LAB — HEMOGLOBIN A1C: Hemoglobin A1C: 9.5

## 2019-03-15 ENCOUNTER — Other Ambulatory Visit: Payer: Self-pay | Admitting: Internal Medicine

## 2019-03-15 DIAGNOSIS — G4700 Insomnia, unspecified: Secondary | ICD-10-CM

## 2019-03-30 ENCOUNTER — Other Ambulatory Visit: Payer: Self-pay | Admitting: Internal Medicine

## 2019-03-30 DIAGNOSIS — N138 Other obstructive and reflux uropathy: Secondary | ICD-10-CM

## 2019-04-04 ENCOUNTER — Telehealth: Payer: Self-pay | Admitting: Internal Medicine

## 2019-04-04 NOTE — Telephone Encounter (Signed)
Mr Staiger complained of shortness of breath cant hardley breath and he is on an inhaler. I advised him going to ER.

## 2019-04-05 NOTE — Telephone Encounter (Signed)
Reviewed and informed Dr. Army Melia. This is the correct plan of action. Thank you.

## 2019-04-30 ENCOUNTER — Telehealth: Payer: Self-pay | Admitting: Internal Medicine

## 2019-04-30 NOTE — Telephone Encounter (Signed)
We cannot see him in the office with SOB due to the current [facility] Policy. He will need to be evaluated in person by Urgent Care since he has had exposure to Covid. SOB could also be an emergency issue of the heart. He needs evaluated ASAP in person at ER or UC.Thank you.

## 2019-04-30 NOTE — Telephone Encounter (Signed)
Pt having SOB, cough no fever. Negative Covid test on Sunday. Came in contact with possible covid on Friday and got tested Saturday results came back negative on Sunday. Began symptoms on Sunday. Wife want to set him up for an appt.

## 2019-05-13 ENCOUNTER — Encounter: Payer: Self-pay | Admitting: Internal Medicine

## 2019-05-13 ENCOUNTER — Other Ambulatory Visit: Payer: Self-pay

## 2019-05-13 ENCOUNTER — Ambulatory Visit (INDEPENDENT_AMBULATORY_CARE_PROVIDER_SITE_OTHER): Payer: 59 | Admitting: Internal Medicine

## 2019-05-13 VITALS — Ht 67.0 in | Wt 179.0 lb

## 2019-05-13 DIAGNOSIS — J40 Bronchitis, not specified as acute or chronic: Secondary | ICD-10-CM | POA: Diagnosis not present

## 2019-05-13 DIAGNOSIS — F17200 Nicotine dependence, unspecified, uncomplicated: Secondary | ICD-10-CM | POA: Diagnosis not present

## 2019-05-13 MED ORDER — DULERA 200-5 MCG/ACT IN AERO: 2.0000 | INHALATION_SPRAY | Freq: Every day | RESPIRATORY_TRACT | 0 refills | Status: DC

## 2019-05-13 NOTE — Progress Notes (Signed)
Spoke with patient by phone. He continues to have SOB with walking or talking. When he is sitting still he is not having these symptoms. When he uses his inhaler that does help some.  Patient states that he has had bronchitis twice since 04/08/2019. He has been tested for Covid twice and both times was negative. Patient has stopped smoking except half of one every three to four days.

## 2019-05-13 NOTE — Progress Notes (Signed)
Date:  05/13/2019   Name:  Paul Stokes   DOB:  [birth year]   MRN:  [id number]  I connected with this patient, Paul Stokes, by telephone at the patient's home.  I verified that I am speaking with the correct person using two identifiers. This visit was conducted via telephone due to the Covid-19 outbreak from my office at [facility] in [place], [place]. I discussed the limitations, risks, security and privacy concerns of performing an evaluation and management service by telephone. I also discussed with the patient that there may be a patient responsible charge related to this service. The patient expressed understanding and agreed to proceed.  Chief Complaint: No chief complaint on file. Per CMA -Spoke with patient by phone. He continues to have SOB with walking or talking. When he is sitting still he is not having these symptoms. When he uses his inhaler that does help some.  Patient states that he has had bronchitis twice since 04/08/2019. He has been tested for Covid twice and both times was negative. Patient has stopped smoking except half of one every three to four days.   He was treated 2 weeks ago for bronchitis with a negative Covid test.  He received steroid taper and Zpak.  He feels much better from those treatments but still has residual cough and shortness of breath with much activity.  He had a CXR but was not told about any COPD changes.  Cough This is a chronic problem. The current episode started more than 1 month ago. The problem has been gradually improving. The cough is non-productive. Associated symptoms include headaches (intermittent, unchanged), shortness of breath and wheezing. Pertinent negatives include no chest pain, chills, fever, nasal congestion, postnasal drip or sore throat. He has tried a beta-agonist inhaler and oral steroids for the symptoms. The treatment provided moderate relief.   Tobacco use - pt recently quit smoking in November.  He noticed that he was  short of breath and decided it was time to stop.  He reports having a CXR at [facility] walkin two weeks ago but I can not view that report.  He was not told whether or not it showed COPD but he did not have an infiltrate.  He is interested in a LDCT. Lab Results  Component Value Date   CREATININE 1.11 08/06/2018   BUN 14 08/06/2018   NA 139 08/06/2018   K 4.8 08/06/2018   CL 98 08/06/2018   CO2 25 08/06/2018   Lab Results  Component Value Date   CHOL 115 08/06/2018   HDL 49 08/06/2018   LDLCALC 54 08/06/2018   TRIG 60 08/06/2018   CHOLHDL 2.3 08/06/2018   Lab Results  Component Value Date   TSH 2.130 06/15/2015   Lab Results  Component Value Date   HGBA1C 10.2 (H) 08/06/2018     Review of Systems  Constitutional: Negative for chills and fever.  HENT: Negative for congestion, postnasal drip, sinus pressure, sore throat and trouble swallowing.   Eyes: Negative for visual disturbance.  Respiratory: Positive for cough, shortness of breath and wheezing.   Cardiovascular: Negative for chest pain.  Gastrointestinal: Positive for abdominal pain (intermittent ). Negative for constipation and diarrhea.  Neurological: Positive for headaches (intermittent, unchanged). Negative for weakness.    Patient Active Problem List   Diagnosis Date Noted  . Dyspepsia 08/06/2018  . Acute bilateral low back pain with right-sided sciatica 11/29/2017  . Mood disorder ([place]) 11/15/2016  . BPH with obstruction/lower  urinary tract symptoms 06/15/2015  . Hyperlipidemia 06/15/2015  . Insomnia, persistent 03/06/2015  . Compulsive tobacco user syndrome 03/06/2015  . Type 1 diabetes mellitus with sensory neuropathy ([place]) 03/06/2015  . Presence of insulin pump 06/08/2014    Allergies  Allergen Reactions  . Clavulanic Acid Nausea And Vomiting  . Lisinopril Cough    Past Surgical History:  Procedure Laterality Date  . COLONOSCOPY  08/2013   7 tubular adenomas    Social History   Tobacco Use  .  Smoking status: Current Every Day Smoker    Packs/day: 0.50    Years: 47.00    Pack years: 23.50    Types: Cigarettes  . Smokeless tobacco: Never Used  . Tobacco comment: trying to cut back some  Substance Use Topics  . Alcohol use: No    Alcohol/week: 0.0 standard drinks  . Drug use: No     Medication list has been reviewed and updated.  Current Meds  Medication Sig  . aspirin 81 MG tablet Take 1 tablet by mouth daily.  Marland Kitchen BAYER CONTOUR NEXT TEST test strip   . buPROPion (WELLBUTRIN SR) 150 MG 12 hr tablet TAKE 1 TABLET BY MOUTH EVERY MORNING AND2 TABLETS AT BEDTIME  . famotidine (PEPCID) 20 MG tablet Take 1 tablet (20 mg total) by mouth 2 (two) times daily.  Marland Kitchen gabapentin (NEURONTIN) 300 MG capsule Take 2 capsules (600 mg total) by mouth at bedtime.  . insulin aspart (NOVOLOG) 100 UNIT/ML injection Inject into the skin. Via insulin pump  . pravastatin (PRAVACHOL) 40 MG tablet TAKE 1 TABLET DAILY  . tamsulosin (FLOMAX) 0.4 MG CAPS capsule TAKE 1 CAPSULE AT BEDTIME  . VENTOLIN HFA 108 (90 Base) MCG/ACT inhaler Inhale 2 puffs into the lungs every 6 (six) hours as needed for wheezing or shortness of breath.  . zolpidem (AMBIEN) 10 MG tablet TAKE ONE TABLET BY MOUTH AT BEDTIME    PHQ 2/9 Scores 08/06/2018 06/05/2018 11/15/2017 11/15/2016  PHQ - 2 Score 2 0 0 0  PHQ- 9 Score 11 - 0 -    BP Readings from Last 3 Encounters:  08/06/18 112/78  06/05/18 120/66  12/11/17 (!) 148/62    Physical Exam Pulmonary:     Effort: Pulmonary effort is normal.     Comments: Occasional dry cough heard during the call, no wheezing and no dyspnea Neurological:     Mental Status: He is alert.     Wt Readings from Last 3 Encounters:  05/13/19 179 lb (81.2 kg)  08/06/18 164 lb (74.4 kg)  06/05/18 161 lb (73 kg)    Ht 5\' 7"  (1.702 m)   Wt 179 lb (81.2 kg)   BMI 28.04 kg/m   Assessment and Plan: 1. Bronchitis Recovering after antibiotics and steroids Suspect his symptoms are residual  inflammation plus long hx of smoking Will try Dulera daily - he can continue Proair as needed - mometasone-formoterol (DULERA) 200-5 MCG/ACT AERO; Inhale 2 puffs into the lungs daily.  Dispense: 13 g; Refill: 0  2. Compulsive tobacco user syndrome Will refer for LDCT scan  I spent 16 minutes on this encounter. Partially dictated using Editor, commissioning. Any errors are unintentional.  Halina Maidens, MD [facility]  05/13/2019

## 2019-05-15 ENCOUNTER — Telehealth: Payer: Self-pay | Admitting: *Deleted

## 2019-05-15 ENCOUNTER — Telehealth: Payer: Self-pay

## 2019-05-15 ENCOUNTER — Other Ambulatory Visit: Payer: Self-pay | Admitting: Internal Medicine

## 2019-05-15 DIAGNOSIS — J40 Bronchitis, not specified as acute or chronic: Secondary | ICD-10-CM

## 2019-05-15 DIAGNOSIS — Z87891 Personal history of nicotine dependence: Secondary | ICD-10-CM

## 2019-05-15 MED ORDER — DOXYCYCLINE HYCLATE 100 MG PO TABS: 100.0000 mg | ORAL_TABLET | Freq: Two times a day (BID) | ORAL | 0 refills | Status: AC

## 2019-05-15 MED ORDER — PREDNISONE 10 MG PO TABS: 10.0000 mg | ORAL_TABLET | ORAL | 0 refills | Status: DC

## 2019-05-15 NOTE — Telephone Encounter (Signed)
Advised 

## 2019-05-15 NOTE — Telephone Encounter (Signed)
Patient now having SOB upon walking and wheezing. Wss not taking Proair right so I discussed best way of use when not using spacer. Also he coughs all nightv and feels tight. Wants to know when CT wil be done since PCP discussed lung cancer screening. I discussed risks wit steroids since he is DM 1 and he said he just wants to breathe better and get rest. I advised him on inhaler use for rescue inhaler and will call back when I have a plan for him that may offer relief. He is taking his steroid inhaler correct and is on Tesslon Pearls.

## 2019-05-15 NOTE — Telephone Encounter (Signed)
Received referral for initial lung cancer screening scan. Contacted patient and obtained smoking history,(former, quit 2020, 94 pack year) as well as answering questions related to screening process. Patient denies signs of lung cancer such as weight loss or hemoptysis. Patient denies comorbidity that would prevent curative treatment if lung cancer were found. Patient is scheduled for shared decision making visit and CT scan on 05/28/19 at 115pm.

## 2019-05-15 NOTE — Telephone Encounter (Signed)
I will send in another steroid taper and another different antibiotic.  He should still use the new inhaler daily along with the previous one up to every 4 hours if needed.

## 2019-05-28 ENCOUNTER — Other Ambulatory Visit: Payer: Self-pay

## 2019-05-28 ENCOUNTER — Ambulatory Visit
Admission: RE | Admit: 2019-05-28 | Discharge: 2019-05-28 | Disposition: A | Discharge status: Home / Self Care | Payer: 59 | Source: Ambulatory Visit | Attending: Nurse Practitioner | Admitting: Nurse Practitioner

## 2019-05-28 ENCOUNTER — Inpatient Hospital Stay: Payer: 59 | Attending: Nurse Practitioner | Admitting: Nurse Practitioner

## 2019-05-28 DIAGNOSIS — Z87891 Personal history of nicotine dependence: Secondary | ICD-10-CM

## 2019-05-28 NOTE — Progress Notes (Signed)
Virtual Visit via Video Enabled Telemedicine Note   I connected with Paul Stokes on 05/28/19 at 1:15 PM EST by video enabled telemedicine visit and verified that I am speaking with the correct person using two identifiers.   I discussed the limitations, risks, security and privacy concerns of performing an evaluation and management service by telemedicine and the availability of in-person appointments. I also discussed with the patient that there may be a patient responsible charge related to this service. The patient expressed understanding and agreed to proceed.   Other persons participating in the visit and their role in the encounter: Burgess Estelle, RN- checking in patient & navigation  Patient's location: [place]  Provider's location: home  Chief Complaint: Low Dose CT Screening  Patient agreed to evaluation by telemedicine to discuss shared decision making for consideration of low dose CT lung cancer screening.    In accordance with CMS guidelines, patient has met eligibility criteria including age, absence of signs or symptoms of lung cancer.  Social History   Tobacco Use  . Smoking status: Former Smoker    Packs/day: 2.00    Years: 47.00    Pack years: 94.00    Types: Cigarettes    Start date: 05/12/1968    Quit date: 04/03/2019    Years since quitting: 0.1  . Smokeless tobacco: Never Used  Substance Use Topics  . Alcohol use: No    Alcohol/week: 0.0 standard drinks     A shared decision-making session was conducted prior to the performance of CT scan. This includes one or more decision aids, includes benefits and harms of screening, follow-up diagnostic testing, over-diagnosis, false positive rate, and total radiation exposure.   Counseling on the importance of adherence to annual lung cancer LDCT screening, impact of co-morbidities, and ability or willingness to undergo diagnosis and treatment is imperative for compliance of the program.   Counseling on the  importance of continued smoking cessation for former smokers; the importance of smoking cessation for current smokers, and information about tobacco cessation interventions have been given to patient including [place] and [employer] Quit [place] programs.   Written order for lung cancer screening with LDCT has been given to the patient and any and all questions have been answered to the best of my abilities.    Yearly follow up will be coordinated by Burgess Estelle, Thoracic Navigator.  I discussed the assessment and treatment plan with the patient. The patient was provided an opportunity to ask questions and all were answered. The patient agreed with the plan and demonstrated an understanding of the instructions.   The patient was advised to call back or seek an in-person evaluation if the symptoms worsen or if the condition fails to improve as anticipated.   I provided 15 minutes of face-to-face video visit time during this encounter, and > 50% was spent counseling as documented under my assessment & plan.   Beckey Rutter, DNP, AGNP-C [place] at [facility] [phone] (clinic)

## 2019-05-29 ENCOUNTER — Telehealth: Payer: Self-pay | Admitting: *Deleted

## 2019-05-29 ENCOUNTER — Ambulatory Visit (INDEPENDENT_AMBULATORY_CARE_PROVIDER_SITE_OTHER): Payer: 59 | Admitting: Internal Medicine

## 2019-05-29 ENCOUNTER — Encounter: Payer: Self-pay | Admitting: Internal Medicine

## 2019-05-29 ENCOUNTER — Other Ambulatory Visit: Payer: Self-pay

## 2019-05-29 VITALS — BP 130/70 | HR 94 | Ht 67.0 in | Wt 180.0 lb

## 2019-05-29 DIAGNOSIS — R1013 Epigastric pain: Secondary | ICD-10-CM | POA: Diagnosis not present

## 2019-05-29 DIAGNOSIS — R1011 Right upper quadrant pain: Secondary | ICD-10-CM | POA: Diagnosis not present

## 2019-05-29 MED ORDER — PANTOPRAZOLE SODIUM 40 MG PO TBEC: 40.0000 mg | DELAYED_RELEASE_TABLET | Freq: Every day | ORAL | 3 refills | Status: DC

## 2019-05-29 NOTE — Telephone Encounter (Signed)
Notified patient of LDCT lung cancer screening program results with recommendation for 12 month follow up imaging. Also notified of incidental findings noted below and is encouraged to discuss further with PCP who will receive a copy of this note and/or the CT report. Patient verbalizes understanding.   IMPRESSION: 1. Lung-RADS 2, benign appearance or behavior. Continue annual screening with low-dose chest CT without contrast in 12 months. 2. Chronic granulomatous disease. 3. LAD coronary artery calcifications noted.  Aortic Atherosclerosis (ICD10-I70.0) and Emphysema (ICD10-J43.9).

## 2019-05-29 NOTE — Progress Notes (Signed)
Date:  05/29/2019   Name:  Paul Stokes   DOB:  Sep 19, 1955   MRN:  [id number]   Chief Complaint: Flank Pain (Upper abdominal pain on both sides. Started about a month ago. Getting gradually worse in the last week. Gets worse when eating spicy foods and hurts mostly at night. ) and Abdominal Pain  Abdominal Pain This is a new problem. The current episode started 1 to 4 weeks ago. The problem occurs daily. The problem has been gradually worsening. The pain is located in the epigastric region, LLQ and RUQ. The quality of the pain is burning and sharp. The abdominal pain does not radiate. Associated symptoms include belching, constipation and nausea. Pertinent negatives include no diarrhea, fever, headaches or vomiting. He has tried H2 blockers and oral narcotic analgesics (started Famotidine about 6 months ago) for the symptoms. The treatment provided significant relief.    Lab Results  Component Value Date   CREATININE 1.11 08/06/2018   BUN 14 08/06/2018   NA 139 08/06/2018   K 4.8 08/06/2018   CL 98 08/06/2018   CO2 25 08/06/2018   Lab Results  Component Value Date   CHOL 115 08/06/2018   HDL 49 08/06/2018   LDLCALC 54 08/06/2018   TRIG 60 08/06/2018   CHOLHDL 2.3 08/06/2018   Lab Results  Component Value Date   TSH 2.130 06/15/2015   Lab Results  Component Value Date   HGBA1C 9.5 02/22/2019     Review of Systems  Constitutional: Negative for chills, fatigue, fever and unexpected weight change.  HENT: Negative for trouble swallowing.   Respiratory: Positive for cough and shortness of breath. Negative for chest tightness and wheezing.   Cardiovascular: Negative for chest pain, palpitations and leg swelling.  Gastrointestinal: Positive for abdominal pain, constipation and nausea. Negative for blood in stool, diarrhea and vomiting.  Neurological: Negative for dizziness and headaches.    Patient Active Problem List   Diagnosis Date Noted  . Dyspepsia 08/06/2018  .  Acute bilateral low back pain with right-sided sciatica 11/29/2017  . Mood disorder ([place]) 11/15/2016  . BPH with obstruction/lower urinary tract symptoms 06/15/2015  . Hyperlipidemia 06/15/2015  . Insomnia, persistent 03/06/2015  . Compulsive tobacco user syndrome 03/06/2015  . Type 1 diabetes mellitus with sensory neuropathy ([place]) 03/06/2015  . Presence of insulin pump 06/08/2014    Allergies  Allergen Reactions  . Clavulanic Acid Nausea And Vomiting  . Lisinopril Cough    Past Surgical History:  Procedure Laterality Date  . COLONOSCOPY  08/2013   7 tubular adenomas    Social History   Tobacco Use  . Smoking status: Former Smoker    Packs/day: 2.00    Years: 47.00    Pack years: 94.00    Types: Cigarettes    Start date: 05/12/1968    Quit date: 04/03/2019    Years since quitting: 0.1  . Smokeless tobacco: Never Used  Substance Use Topics  . Alcohol use: No    Alcohol/week: 0.0 standard drinks  . Drug use: No    Medication list has been reviewed and updated.  Current Meds  Medication Sig  . aspirin 81 MG tablet Take 1 tablet by mouth daily.  Marland Kitchen BAYER CONTOUR NEXT TEST test strip   . buPROPion (WELLBUTRIN SR) 150 MG 12 hr tablet TAKE 1 TABLET BY MOUTH EVERY MORNING AND2 TABLETS AT BEDTIME  . famotidine (PEPCID) 20 MG tablet Take 1 tablet (20 mg total) by mouth 2 (two) times daily.  Marland Kitchen  gabapentin (NEURONTIN) 300 MG capsule Take 2 capsules (600 mg total) by mouth at bedtime.  . insulin aspart (NOVOLOG) 100 UNIT/ML injection Inject into the skin. Via insulin pump  . mometasone-formoterol (DULERA) 200-5 MCG/ACT AERO Inhale 2 puffs into the lungs daily.  . pravastatin (PRAVACHOL) 40 MG tablet TAKE 1 TABLET DAILY  . tamsulosin (FLOMAX) 0.4 MG CAPS capsule TAKE 1 CAPSULE AT BEDTIME  . VENTOLIN HFA 108 (90 Base) MCG/ACT inhaler Inhale 2 puffs into the lungs every 6 (six) hours as needed for wheezing or shortness of breath.  . zolpidem (AMBIEN) 10 MG tablet TAKE ONE TABLET BY  MOUTH AT BEDTIME    PHQ 2/9 Scores 05/29/2019 08/06/2018 06/05/2018 11/15/2017  PHQ - 2 Score 0 2 0 0  PHQ- 9 Score - 11 - 0    BP Readings from Last 3 Encounters:  05/29/19 130/70  08/06/18 112/78  06/05/18 120/66    Physical Exam Vitals and nursing note reviewed.  Constitutional:      General: He is not in acute distress.    Appearance: He is well-developed.  HENT:     Head: Normocephalic and atraumatic.  Pulmonary:     Effort: Pulmonary effort is normal. No respiratory distress.  Abdominal:     General: Abdomen is protuberant. Bowel sounds are decreased.     Palpations: Abdomen is soft. There is no fluid wave, hepatomegaly or splenomegaly.     Tenderness: There is abdominal tenderness in the right upper quadrant and epigastric area. There is no right CVA tenderness, left CVA tenderness, guarding or rebound.  Musculoskeletal:        General: Normal range of motion.  Skin:    General: Skin is warm and dry.     Findings: No rash.  Neurological:     Mental Status: He is alert and oriented to person, place, and time.  Psychiatric:        Behavior: Behavior normal.        Thought Content: Thought content normal.     Wt Readings from Last 3 Encounters:  05/29/19 180 lb (81.6 kg)  05/28/19 179 lb (81.2 kg)  05/13/19 179 lb (81.2 kg)    BP 130/70   Pulse 94   Ht 5\' 7"  (1.702 m)   Wt 180 lb (81.6 kg)   SpO2 98%   BMI 28.19 kg/m   Assessment and Plan: 1. Colicky RUQ abdominal pain Suspect Gall bladder disease Will get [place] If sx are severe he should go to the ED - US Abdomen Complete; Future  2. Dyspepsia Stop famotidine Start PPI daily - pantoprazole (PROTONIX) 40 MG tablet; Take 1 tablet (40 mg total) by mouth daily.  Dispense: 30 tablet; Refill: 3   Partially dictated using Editor, commissioning. Any errors are unintentional.  Halina Maidens, MD [facility]  05/29/2019

## 2019-06-04 ENCOUNTER — Other Ambulatory Visit: Payer: Self-pay

## 2019-06-04 ENCOUNTER — Ambulatory Visit
Admission: RE | Admit: 2019-06-04 | Discharge: 2019-06-04 | Disposition: A | Discharge status: Home / Self Care | Payer: 59 | Source: Ambulatory Visit | Attending: Internal Medicine | Admitting: Internal Medicine

## 2019-06-04 DIAGNOSIS — R1011 Right upper quadrant pain: Secondary | ICD-10-CM

## 2019-06-06 ENCOUNTER — Ambulatory Visit: Payer: Self-pay | Admitting: Surgery

## 2019-06-13 ENCOUNTER — Encounter: Payer: Self-pay | Admitting: Surgery

## 2019-06-13 ENCOUNTER — Other Ambulatory Visit: Payer: Self-pay

## 2019-06-13 ENCOUNTER — Ambulatory Visit (INDEPENDENT_AMBULATORY_CARE_PROVIDER_SITE_OTHER): Payer: 59 | Admitting: Surgery

## 2019-06-13 VITALS — BP 135/84 | HR 89 | Temp 97.9°F | Resp 12 | Ht 67.0 in | Wt 180.4 lb

## 2019-06-13 DIAGNOSIS — Z1211 Encounter for screening for malignant neoplasm of colon: Secondary | ICD-10-CM

## 2019-06-13 DIAGNOSIS — K824 Cholesterolosis of gallbladder: Secondary | ICD-10-CM | POA: Diagnosis not present

## 2019-06-13 DIAGNOSIS — R1011 Right upper quadrant pain: Secondary | ICD-10-CM | POA: Diagnosis not present

## 2019-06-13 NOTE — Progress Notes (Signed)
Patient ID: Paul Stokes, male   DOB: 10-18-1955, 64 y.o.   MRN: [id number]  Chief Complaint: Right upper quadrant/epigastric pain, postprandial.  History of Present Illness Paul Stokes is a 64 y.o. male with the above complaints present for months to years.  Has recently become more intense, exacerbated by spicy foods.  Epigastric pain which spreads across the costal margins bilaterally.  It is typically worse in the evening.  He has no associated nausea.  Takes a PPI.  He was previously noted to have a normal gallbladder ultrasound and normal HIDA in 2013.  He has a recent ultrasound which shows a 3 mm polyp, otherwise unremarkable.  He has a prior history of benign colonic polyps.  And is due for further screening as it is proximally 8+ years since.    Past Medical History Past Medical History:  Diagnosis Date  . Diabetes mellitus without complication ([place])   . Emphysema of lung ([place])   . GERD (gastroesophageal reflux disease)   . Prostate disease       Past Surgical History:  Procedure Laterality Date  . COLONOSCOPY  08/2013   7 tubular adenomas    Allergies  Allergen Reactions  . Clavulanic Acid Nausea And Vomiting  . Lisinopril Cough    Current Outpatient Medications  Medication Sig Dispense Refill  . aspirin 81 MG tablet Take 1 tablet by mouth daily.    Marland Kitchen BAYER CONTOUR NEXT TEST test strip   1  . buPROPion (WELLBUTRIN SR) 150 MG 12 hr tablet TAKE 1 TABLET BY MOUTH EVERY MORNING AND2 TABLETS AT BEDTIME 270 tablet 1  . gabapentin (NEURONTIN) 300 MG capsule Take 2 capsules (600 mg total) by mouth at bedtime. 180 capsule 1  . insulin aspart (NOVOLOG) 100 UNIT/ML injection Inject into the skin. Via insulin pump    . mometasone-formoterol (DULERA) 200-5 MCG/ACT AERO Inhale 2 puffs into the lungs daily. 13 g 0  . pantoprazole (PROTONIX) 40 MG tablet Take 1 tablet (40 mg total) by mouth daily. 30 tablet 3  . pravastatin (PRAVACHOL) 40 MG tablet TAKE 1 TABLET DAILY 90 tablet 3  .  tamsulosin (FLOMAX) 0.4 MG CAPS capsule TAKE 1 CAPSULE AT BEDTIME 90 capsule 3  . VENTOLIN HFA 108 (90 Base) MCG/ACT inhaler Inhale 2 puffs into the lungs every 6 (six) hours as needed for wheezing or shortness of breath. 18 g 1  . zolpidem (AMBIEN) 10 MG tablet TAKE ONE TABLET BY MOUTH AT BEDTIME 30 tablet 5   No current facility-administered medications for this visit.    Family History Family History  Problem Relation Age of Onset  . Diabetes Mother   . Diabetes Father       Social History Social History   Tobacco Use  . Smoking status: Former Smoker    Packs/day: 2.00    Years: 47.00    Pack years: 94.00    Types: Cigarettes    Start date: 05/12/1968    Quit date: 04/03/2019    Years since quitting: 0.1  . Smokeless tobacco: Never Used  Substance Use Topics  . Alcohol use: No    Alcohol/week: 0.0 standard drinks  . Drug use: No        Review of Systems  Constitutional: Negative.   HENT: Negative.   Eyes: Negative.   Respiratory: Negative.   Cardiovascular: Negative.   Gastrointestinal: Positive for constipation. Negative for blood in stool, diarrhea, heartburn, melena, nausea and vomiting.  Genitourinary: Negative.   Musculoskeletal: Negative.  Skin: Negative.   Neurological: Negative.   Endo/Heme/Allergies: Negative.       Physical Exam Blood pressure 135/84, pulse 89, temperature 97.9 F (36.6 C), resp. rate 12, height 5\' 7"  (1.702 m), weight 180 lb 6.4 oz (81.8 kg), SpO2 98 %. Last Weight  Most recent update: 06/13/2019 11:05 AM   Weight  81.8 kg (180 lb 6.4 oz)            CONSTITUTIONAL: Well developed, and nourished, appropriately responsive and aware without distress.   EYES: Sclera non-icteric.   EARS, NOSE, MOUTH AND THROAT: Mask worn.    Hearing is intact to voice.  NECK: Trachea is midline, and there is no jugular venous distension.  LYMPH NODES:  Lymph nodes in the neck are not enlarged. RESPIRATORY:  Lungs are clear, and breath sounds  are equal bilaterally. Normal respiratory effort without pathologic use of accessory muscles. CARDIOVASCULAR: Heart is regular in rate and rhythm. GI: The abdomen is  soft, nontender, and nondistended. There were no palpable masses. I did not appreciate hepatosplenomegaly. There were normal bowel sounds. MUSCULOSKELETAL:  Symmetrical muscle tone appreciated in all four extremities.    SKIN: Skin turgor is normal. No pathologic skin lesions appreciated.  NEUROLOGIC:  Motor and sensation appear grossly normal.  Cranial nerves are grossly without defect. PSYCH:  Alert and oriented to person, place and time. Affect is appropriate for situation.  Data Reviewed I have personally reviewed what is currently available of the patient's imaging, recent labs and medical records.   Labs:  CBC Latest Ref Rng & Units 08/06/2018 06/15/2015 05/23/2013  WBC 3.4 - 10.8 x10E3/uL 7.2 6.7 -  Hemoglobin 13.0 - 17.7 g/dL 15.1 14.9 15.6  Hematocrit 37.5 - 51.0 % 46.8 44.5 -  Platelets 150 - 450 x10E3/uL 293 327 -   CMP Latest Ref Rng & Units 08/06/2018 06/15/2015  Glucose 65 - 99 mg/dL 198(H) 204(H)  BUN 8 - 27 mg/dL 14 17  Creatinine 0.76 - 1.27 mg/dL 1.11 1.00  Sodium 134 - 144 mmol/L 139 137  Potassium 3.5 - 5.2 mmol/L 4.8 5.3(H)  Chloride 96 - 106 mmol/L 98 100  CO2 20 - 29 mmol/L 25 24  Calcium 8.6 - 10.2 mg/dL 9.7 9.5  Total Protein 6.0 - 8.5 g/dL 6.4 6.6  Total Bilirubin 0.0 - 1.2 mg/dL 0.4 0.4  Alkaline Phos 39 - 117 IU/L 88 87  AST 0 - 40 IU/L 18 16  ALT 0 - 44 IU/L 33 24   Gallbladder ultrasound report reviewed.     Assessment    Questionably symptomatic gallbladder polyp.  Possible acalculous cholecystitis, clearly not acute. History of colonic polyps. Patient Active Problem List   Diagnosis Date Noted  . Dyspepsia 08/06/2018  . Acute bilateral low back pain with right-sided sciatica 11/29/2017  . Mood disorder ([place]) 11/15/2016  . BPH with obstruction/lower urinary tract symptoms 06/15/2015   . Hyperlipidemia 06/15/2015  . Insomnia, persistent 03/06/2015  . Compulsive tobacco user syndrome 03/06/2015  . Type 1 diabetes mellitus with sensory neuropathy ([place]) 03/06/2015  . Presence of insulin pump 06/08/2014    Plan    We will obtain HIDA scan to evaluate gallbladder function, and EF with possible precipitation of symptoms with Ensure stimulation. Referred to GI for follow-up colonoscopy, possible EGD should HIDA scan appear to be normal and not provoke symptoms with gallbladder provocation.  He will follow-up after the HIDA scan, and if it is normal follow-up after his GI consultation.  Face-to-face time spent with  the patient and accompanying care providers(if present) was 30 minutes, with more than 50% of the time spent counseling, educating, and coordinating care of the patient.      Ronny Bacon M.D., FACS 06/13/2019, 1:28 PM

## 2019-06-13 NOTE — Patient Instructions (Addendum)
We will get you scheduled for a HIDA scan of the Gallbladder. We will call you with these results. We will see you after this exam.   We have referred you to Gastroenterology for a repeat colonoscopy and exam. They will contact you to schedule this appointment.  We have scheduled you for a HIDA scan at [facility] for 06/25/19 at 10:00 am. You will arrive by 9:30 am and have nothing to eat or drink for 6 hours prior.

## 2019-06-17 ENCOUNTER — Encounter: Payer: Self-pay | Admitting: Internal Medicine

## 2019-06-18 ENCOUNTER — Encounter: Payer: Self-pay | Admitting: *Deleted

## 2019-06-18 NOTE — Telephone Encounter (Signed)
Please advise pt message about inhaler?

## 2019-06-20 ENCOUNTER — Encounter: Payer: Self-pay | Admitting: *Deleted

## 2019-06-25 ENCOUNTER — Encounter
Admission: RE | Admit: 2019-06-25 | Discharge: 2019-06-25 | Disposition: A | Discharge status: Home / Self Care | Payer: 59 | Source: Ambulatory Visit | Attending: Surgery | Admitting: Surgery

## 2019-06-25 ENCOUNTER — Other Ambulatory Visit: Payer: Self-pay

## 2019-06-25 DIAGNOSIS — R1011 Right upper quadrant pain: Secondary | ICD-10-CM | POA: Insufficient documentation

## 2019-06-25 DIAGNOSIS — K824 Cholesterolosis of gallbladder: Secondary | ICD-10-CM | POA: Insufficient documentation

## 2019-06-25 MED ORDER — TECHNETIUM TC 99M MEBROFENIN IV KIT
5.0000 | PACK | Freq: Once | INTRAVENOUS | Status: AC | PRN
  Administered 2019-06-25: 5.64 via INTRAVENOUS

## 2019-07-02 ENCOUNTER — Ambulatory Visit (INDEPENDENT_AMBULATORY_CARE_PROVIDER_SITE_OTHER): Payer: 59 | Admitting: Surgery

## 2019-07-02 ENCOUNTER — Encounter: Payer: Self-pay | Admitting: Surgery

## 2019-07-02 ENCOUNTER — Other Ambulatory Visit: Payer: Self-pay

## 2019-07-02 VITALS — BP 143/84 | HR 103 | Temp 96.6°F | Resp 14 | Ht 67.0 in | Wt 181.2 lb

## 2019-07-02 DIAGNOSIS — R1084 Generalized abdominal pain: Secondary | ICD-10-CM | POA: Diagnosis not present

## 2019-07-02 DIAGNOSIS — R635 Abnormal weight gain: Secondary | ICD-10-CM

## 2019-07-02 NOTE — Progress Notes (Signed)
Surgical Clinic Progress/Follow-up Note   HPI:  64 y.o. Male presents to clinic for HIDA scan review and follow-up  Patient reports  improvement/resolution of prior issues and has been tolerating regular diet with +flatus and normal BM's, denies N/V, fever/chills, CP, or SOB.  Further review of his symptoms seem to confirm an erratic nature once weekly, epigastric and benign.  We discussed his bowel habit history it appears that his irregularity is far more than should be tolerated.  He indicates sometimes going a week without a bowel movement.  And then he has multiple liquid movements and compensation.  We discussed the role of fiber to assist with regularity followed by fluids/MiraLAX.  Encouraged good hydration and the goals of 2 bowel movements daily.  Review of Systems:  Constitutional: denies fever/chills  ENT: denies sore throat, hearing problems  Respiratory: denies shortness of breath, wheezing  Cardiovascular: denies chest pain, palpitations  Gastrointestinal: denies abdominal pain, N/V, or diarrhea/and bowel function as per interval history Skin: Denies any other rashes or skin discolorations as per interval history  Vital Signs:  BP (!) 143/84   Pulse (!) 103   Temp (!) 96.6 F (35.9 C) (Temporal)   Resp 14   Ht 5\' 7"  (1.702 m)   Wt 181 lb 3.2 oz (82.2 kg)   SpO2 98%   BMI 28.38 kg/m    Physical Exam:  Constitutional:  -- Normal body habitus  -- Awake, alert, and oriented x3  Pulmonary:  -- No crackles -- Equal breath sounds bilaterally -- Breathing non-labored at rest Cardiovascular:  -- S1, S2 present  -- No pericardial rubs  Gastrointestinal:  -- Soft and non-distended, non-tender/with no guarding/rebound tenderness -- No abdominal masses appreciated, pulsatile or otherwise   Musculoskeletal / Integumentary:  -- Wounds or skin discoloration: None appreciated  -- Extremities: B/L UE and LE FROM, hands and feet warm, no edema   Laboratory studies: Radiology  review: 89% ejection fraction noted on HIDA, no symptoms during/following ensure ingestion.  Imaging: Noted that he has not had an abdominal CT scan.  We discussed that and he would like to defer this until our other measures have been tried.  No new pertinent imaging available for review   Assessment:  64 y.o. yo Male with a problem list including...  Patient Active Problem List   Diagnosis Date Noted  . Dyspepsia 08/06/2018  . Acute bilateral low back pain with right-sided sciatica 11/29/2017  . Mood disorder ([place]) 11/15/2016  . BPH with obstruction/lower urinary tract symptoms 06/15/2015  . Hyperlipidemia 06/15/2015  . Insomnia, persistent 03/06/2015  . Compulsive tobacco user syndrome 03/06/2015  . Type 1 diabetes mellitus with sensory neuropathy ([place]) 03/06/2015  . Presence of insulin pump 06/08/2014    presents to clinic for follow-up evaluation of epigastric pain with bowel habit irregularity, progressing well.  Plan:              - return to clinic as needed after GI evaluation in April. We discussed a bowel regularity regimen of fiber and fluids, followed by MiraLAX to ensure regularity, encouraged him to take the time to move his bowels following meals.  We will plan on repeating abdominal ultrasound in a year for follow-up.  All of the above recommendations were discussed with the patient, and all of patient's questions were answered to his expressed satisfaction.  Ronny Bacon, MD, FACS [place]: Guadalupe Guerra for exceptional care. Office: [phone]

## 2019-07-02 NOTE — Patient Instructions (Addendum)
Try to keep your bowel movements regular. May take Fiber supplements daily, you need 30 grams of fiber daily from diet and supplements, and increase fluids.   Call us if your abdominal pain persists and we will get you set up for a CT of the abdomin and pelvis with ontrast.  We will have you follow up here with an ultrasound of your gallbladder and exam to look at your gallbladder polyp.

## 2019-07-11 ENCOUNTER — Other Ambulatory Visit: Payer: Self-pay | Admitting: Internal Medicine

## 2019-07-11 DIAGNOSIS — J40 Bronchitis, not specified as acute or chronic: Secondary | ICD-10-CM

## 2019-07-24 ENCOUNTER — Other Ambulatory Visit: Payer: Self-pay

## 2019-07-24 ENCOUNTER — Other Ambulatory Visit: Payer: Self-pay | Admitting: Internal Medicine

## 2019-07-24 DIAGNOSIS — M5431 Sciatica, right side: Secondary | ICD-10-CM

## 2019-07-24 MED ORDER — VENTOLIN HFA 108 (90 BASE) MCG/ACT IN AERS: 2.0000 | INHALATION_SPRAY | Freq: Four times a day (QID) | RESPIRATORY_TRACT | 1 refills | Status: DC | PRN

## 2019-07-25 ENCOUNTER — Other Ambulatory Visit: Payer: Self-pay | Admitting: Internal Medicine

## 2019-07-25 DIAGNOSIS — F39 Unspecified mood [affective] disorder: Secondary | ICD-10-CM

## 2019-07-26 ENCOUNTER — Other Ambulatory Visit: Payer: Self-pay | Admitting: *Deleted

## 2019-07-26 MED ORDER — VENTOLIN HFA 108 (90 BASE) MCG/ACT IN AERS: 2.0000 | INHALATION_SPRAY | Freq: Four times a day (QID) | RESPIRATORY_TRACT | 1 refills | Status: DC | PRN

## 2019-07-26 NOTE — Telephone Encounter (Signed)
Pt called and left a VM today.  [facility] is [facility]  Will forward to Dr. Army Melia for approval.

## 2019-07-29 ENCOUNTER — Other Ambulatory Visit: Payer: Self-pay

## 2019-07-29 MED ORDER — ALBUTEROL SULFATE HFA 108 (90 BASE) MCG/ACT IN AERS: 2.0000 | INHALATION_SPRAY | Freq: Four times a day (QID) | RESPIRATORY_TRACT | 5 refills | Status: DC | PRN

## 2019-08-15 ENCOUNTER — Telehealth: Payer: Self-pay

## 2019-08-15 NOTE — Telephone Encounter (Signed)
Pt called saying he forgot to use his steroid inhaler yesterday before leaving the house and when he went outside around the pollen his asthma flared up and he felt SOB.   Today he got back on track and used his inhaler but also added Claritin.  Spoke with Dr. Army Melia and told the patient to take claritin daily and be sure to stay on track with his inhaler use daily.   If this problem happens again without skipping the inhaler, told him to let us know.    CM

## 2019-08-20 ENCOUNTER — Encounter: Payer: Self-pay | Admitting: Gastroenterology

## 2019-08-20 ENCOUNTER — Other Ambulatory Visit: Payer: Self-pay

## 2019-08-20 ENCOUNTER — Ambulatory Visit (INDEPENDENT_AMBULATORY_CARE_PROVIDER_SITE_OTHER): Payer: 59 | Admitting: Gastroenterology

## 2019-08-20 VITALS — BP 136/72 | HR 89 | Temp 98.2°F | Ht 67.0 in | Wt 180.6 lb

## 2019-08-20 DIAGNOSIS — R109 Unspecified abdominal pain: Secondary | ICD-10-CM | POA: Diagnosis not present

## 2019-08-20 MED ORDER — SUPREP BOWEL PREP KIT 17.5-3.13-1.6 GM/177ML PO SOLN: 1.0000 | ORAL | 0 refills | Status: DC

## 2019-08-20 NOTE — Progress Notes (Signed)
Gastroenterology Consultation  Referring Provider:     Ronny Bacon, MD Primary Care Physician:  Glean Hess, MD Primary Gastroenterologist:  Dr. Allen Norris     Reason for Consultation:     Abdominal pain        HPI:   Paul Stokes is a 64 y.o. y/o male referred for consultation & management of Abdominal pain by Dr. Army Melia, Jesse Sans, MD.  This patient comes in today with a report of abdominal pain.  He reports that his abdominal pain was in his right upper quadrant.  The patient was seen by surgery who thought that fiber should be added to his diet since he has a history of constipation.  The patient states after starting the fiber supplementation his pain completely resolved.  He has no issues at the present time and has not had any recurrence of the abdominal pain.  The patient has a history of having 6 or 7 adenomas at his last colonoscopy.  The patient has not had a repeat colonoscopy since then.It appears that the colonoscopy was in 2015.  There is no report of any black stools or bloody stools.  He also denies any fevers chills nausea vomiting or further abdominal pain.  Past Medical History:  Diagnosis Date  . Diabetes mellitus without complication ([place])   . Emphysema of lung ([place])   . GERD (gastroesophageal reflux disease)   . Prostate disease     Past Surgical History:  Procedure Laterality Date  . COLONOSCOPY  08/2013   7 tubular adenomas    Prior to Admission medications   Medication Sig Start Date End Date Taking? Authorizing Provider  albuterol (VENTOLIN HFA) 108 (90 Base) MCG/ACT inhaler Inhale 2 puffs into the lungs every 6 (six) hours as needed for wheezing or shortness of breath. 07/29/19  Yes Glean Hess, MD  aspirin 81 MG tablet Take 1 tablet by mouth daily.   Yes [provider]  BAYER CONTOUR NEXT TEST test strip  01/01/15  Yes [provider]  buPROPion (WELLBUTRIN SR) 150 MG 12 hr tablet TAKE 1 TABLET EVERY MORNING AND TAKE 2  TABLETS AT BEDTIME 07/25/19  Yes Glean Hess, MD  Continuous Blood Gluc Sensor (FREESTYLE LIBRE 14 DAY SENSOR) MISC SMARTSIG:1 Kit(s) Topical Every 2 Weeks 04/10/19  Yes [provider]  DULERA 200-5 MCG/ACT AERO TAKE 2 PUFFS INTO LUNGS DAILY 07/11/19  Yes Glean Hess, MD  gabapentin (NEURONTIN) 300 MG capsule TAKE 2 CAPSULES AT BEDTIME 07/24/19  Yes Glean Hess, MD  HUMALOG 100 UNIT/ML injection  03/06/19  Yes [provider]  insulin aspart (NOVOLOG) 100 UNIT/ML injection Inject into the skin. Via insulin pump   Yes [provider]  pantoprazole (PROTONIX) 40 MG tablet Take 1 tablet (40 mg total) by mouth daily. 05/29/19  Yes Glean Hess, MD  pravastatin (PRAVACHOL) 40 MG tablet TAKE 1 TABLET DAILY 03/31/19  Yes Glean Hess, MD  tamsulosin [facility]) 0.4 MG CAPS capsule TAKE 1 CAPSULE AT BEDTIME 03/31/19  Yes Glean Hess, MD  zolpidem (AMBIEN) 10 MG tablet TAKE ONE TABLET BY MOUTH AT BEDTIME 03/16/19  Yes Glean Hess, MD    Family History  Problem Relation Age of Onset  . Diabetes Mother   . Diabetes Father      Social History   Tobacco Use  . Smoking status: Former Smoker    Packs/day: 2.00    Years: 47.00    Pack years: 94.00  Types: Cigarettes    Start date: 05/12/1968    Quit date: 04/03/2019    Years since quitting: 0.3  . Smokeless tobacco: Never Used  Substance Use Topics  . Alcohol use: No    Alcohol/week: 0.0 standard drinks  . Drug use: No    Allergies as of 08/20/2019 - Review Complete 08/20/2019  Allergen Reaction Noted  . Clavulanic acid Nausea And Vomiting 10/26/2015  . Lisinopril Cough 11/15/2016    Review of Systems:    All systems reviewed and negative except where noted in HPI.   Physical Exam:  BP 136/72   Pulse 89   Temp 98.2 F (36.8 C) (Oral)   Ht '5\' 7"'$  (1.702 m)   Wt 180 lb 9.6 oz (81.9 kg)   BMI 28.29 kg/m  No LMP for male patient. General:   Alert,  Well-developed,  well-nourished, pleasant and cooperative in NAD Head:  Normocephalic and atraumatic. Eyes:  Sclera clear, no icterus.   Conjunctiva pink. Ears:  Normal auditory acuity. Neck:  Supple; no masses or thyromegaly. Lungs:  Respirations even and unlabored.  Clear throughout to auscultation.   No wheezes, crackles, or rhonchi. No acute distress. Heart:  Regular rate and rhythm; no murmurs, clicks, rubs, or gallops. Abdomen:  Normal bowel sounds.  No bruits.  Soft, non-tender and non-distended without masses, hepatosplenomegaly or hernias noted.  No guarding or rebound tenderness.  Negative Carnett sign.   Rectal:  Deferred.  Pulses:  Normal pulses noted. Extremities:  No clubbing or edema.  No cyanosis. Neurologic:  Alert and oriented x3;  grossly normal neurologically. Skin:  Intact without significant lesions or rashes.  No jaundice. Lymph Nodes:  No significant cervical adenopathy. Psych:  Alert and cooperative. Normal mood and affect.  Imaging Studies: No results found.  Assessment and Plan:   Paul Stokes is a 64 y.o. y/o male Who comes in today with a history of abdominal pain that has completely resolved.  The patient is due for repeat colonoscopy since his last colonoscopy was 6 years ago when he had 6 or 7 tubular adenomas at that time.  The patient has been told to continue his fiber  supplementation.  Is also been told that if the pain comes back he should try and increase his fiber intake or take a laxative to help evacuate his colon to see if that helps his symptoms. The patient has been explained the plan and agrees with it.   [name], MD. [facility]    Note: This dictation was prepared with Dragon dictation along with smaller phrase technology. Any transcriptional errors that result from this process are unintentional.

## 2019-08-21 ENCOUNTER — Other Ambulatory Visit: Payer: Self-pay

## 2019-08-21 DIAGNOSIS — Z86010 Personal history of colonic polyps: Secondary | ICD-10-CM

## 2019-08-27 ENCOUNTER — Other Ambulatory Visit: Payer: Self-pay | Admitting: Internal Medicine

## 2019-08-27 DIAGNOSIS — I700 Atherosclerosis of aorta: Secondary | ICD-10-CM | POA: Insufficient documentation

## 2019-08-27 DIAGNOSIS — G4700 Insomnia, unspecified: Secondary | ICD-10-CM

## 2019-08-27 MED ORDER — ZOLPIDEM TARTRATE 10 MG PO TABS: 10.0000 mg | ORAL_TABLET | Freq: Every day | ORAL | 1 refills | Status: DC

## 2019-09-06 ENCOUNTER — Other Ambulatory Visit
Admission: RE | Admit: 2019-09-06 | Discharge: 2019-09-06 | Disposition: A | Discharge status: Home / Self Care | Payer: 59 | Source: Ambulatory Visit | Attending: Gastroenterology | Admitting: Gastroenterology

## 2019-09-06 ENCOUNTER — Other Ambulatory Visit: Payer: Self-pay

## 2019-09-06 DIAGNOSIS — Z20822 Contact with and (suspected) exposure to covid-19: Secondary | ICD-10-CM | POA: Insufficient documentation

## 2019-09-06 DIAGNOSIS — Z01812 Encounter for preprocedural laboratory examination: Secondary | ICD-10-CM | POA: Insufficient documentation

## 2019-09-06 LAB — SARS CORONAVIRUS 2 (TAT 6-24 HRS): SARS Coronavirus 2: NEGATIVE

## 2019-09-10 ENCOUNTER — Encounter: Payer: Self-pay | Admitting: Gastroenterology

## 2019-09-10 ENCOUNTER — Ambulatory Visit: Payer: 59 | Admitting: Anesthesiology

## 2019-09-10 ENCOUNTER — Ambulatory Visit
Admission: RE | Admit: 2019-09-10 | Discharge: 2019-09-10 | Disposition: A | Discharge status: Home / Self Care | Payer: 59 | Attending: Gastroenterology | Admitting: Gastroenterology

## 2019-09-10 ENCOUNTER — Other Ambulatory Visit: Payer: Self-pay

## 2019-09-10 ENCOUNTER — Encounter
Admission: RE | Disposition: A | Discharge status: Home / Self Care | Payer: Self-pay | Source: Home / Self Care | Attending: Gastroenterology

## 2019-09-10 DIAGNOSIS — Z1211 Encounter for screening for malignant neoplasm of colon: Secondary | ICD-10-CM | POA: Insufficient documentation

## 2019-09-10 DIAGNOSIS — E119 Type 2 diabetes mellitus without complications: Secondary | ICD-10-CM | POA: Diagnosis not present

## 2019-09-10 DIAGNOSIS — Z794 Long term (current) use of insulin: Secondary | ICD-10-CM | POA: Diagnosis not present

## 2019-09-10 DIAGNOSIS — Z833 Family history of diabetes mellitus: Secondary | ICD-10-CM | POA: Diagnosis not present

## 2019-09-10 DIAGNOSIS — Z9641 Presence of insulin pump (external) (internal): Secondary | ICD-10-CM | POA: Insufficient documentation

## 2019-09-10 DIAGNOSIS — Z87891 Personal history of nicotine dependence: Secondary | ICD-10-CM | POA: Insufficient documentation

## 2019-09-10 DIAGNOSIS — Z860100 Personal history of colon polyps, unspecified: Secondary | ICD-10-CM

## 2019-09-10 DIAGNOSIS — J439 Emphysema, unspecified: Secondary | ICD-10-CM | POA: Diagnosis not present

## 2019-09-10 DIAGNOSIS — K219 Gastro-esophageal reflux disease without esophagitis: Secondary | ICD-10-CM | POA: Insufficient documentation

## 2019-09-10 DIAGNOSIS — Z5309 Procedure and treatment not carried out because of other contraindication: Secondary | ICD-10-CM | POA: Diagnosis not present

## 2019-09-10 HISTORY — PX: COLONOSCOPY WITH PROPOFOL: SHX5780

## 2019-09-10 LAB — GLUCOSE, CAPILLARY: Glucose-Capillary: 125 mg/dL — ABNORMAL HIGH (ref 70–99)

## 2019-09-10 SURGERY — COLONOSCOPY WITH PROPOFOL
Anesthesia: General

## 2019-09-10 MED ORDER — SODIUM CHLORIDE 0.9 % IV SOLN: INTRAVENOUS | Status: DC

## 2019-09-10 MED ORDER — FENTANYL CITRATE (PF) 100 MCG/2ML IJ SOLN
INTRAMUSCULAR | Status: DC | PRN
  Administered 2019-09-10 (×2): 50 ug via INTRAVENOUS

## 2019-09-10 MED ORDER — MIDAZOLAM HCL 2 MG/2ML IJ SOLN
INTRAMUSCULAR | Status: DC | PRN
  Administered 2019-09-10: 2 mg via INTRAVENOUS

## 2019-09-10 MED ORDER — PROPOFOL 500 MG/50ML IV EMUL
INTRAVENOUS | Status: DC | PRN
  Administered 2019-09-10: 120 ug/kg/min via INTRAVENOUS

## 2019-09-10 MED ORDER — FENTANYL CITRATE (PF) 100 MCG/2ML IJ SOLN
INTRAMUSCULAR | Status: AC
  Filled 2019-09-10: qty 2

## 2019-09-10 MED ORDER — PROPOFOL 500 MG/50ML IV EMUL
INTRAVENOUS | Status: AC
  Filled 2019-09-10: qty 50

## 2019-09-10 MED ORDER — MIDAZOLAM HCL 2 MG/2ML IJ SOLN
INTRAMUSCULAR | Status: AC
  Filled 2019-09-10: qty 2

## 2019-09-10 NOTE — Anesthesia Postprocedure Evaluation (Signed)
Anesthesia Post Note  Patient: Paul Stokes  Procedure(s) Performed: COLONOSCOPY WITH PROPOFOL (N/A )  Patient location during evaluation: Endoscopy Anesthesia Type: General Level of consciousness: awake and alert and oriented Pain management: pain level controlled Vital Signs Assessment: post-procedure vital signs reviewed and stable Respiratory status: spontaneous breathing, nonlabored ventilation and respiratory function stable Cardiovascular status: blood pressure returned to baseline and stable Postop Assessment: no signs of nausea or vomiting Anesthetic complications: no     Last Vitals:  Vitals:   09/10/19 1027 09/10/19 1037  BP: 111/65 (!) 114/59  Pulse: 71 60  Resp: 12 14  Temp:    SpO2: 99% 100%    Last Pain:  Vitals:   09/10/19 1027  TempSrc:   PainSc: 0-No pain                 Sabeen Piechocki

## 2019-09-10 NOTE — Op Note (Signed)
[facility] Gastroenterology Patient Name: Paul Stokes Procedure Date: 09/10/2019 9:49 AM MRN: [id number] Account #: [id number] Date of Birth: [birth year] Admit Type: Outpatient Age: 64 Room: [facility] ENDO ROOM 4 Gender: Male Note Status: Finalized Procedure:             Colonoscopy Indications:           High risk colon cancer surveillance: Personal history                         of colonic polyps Providers:             [name] MD, MD Referring MD:          Halina Maidens, MD (Referring MD) Medicines:             Propofol per Anesthesia Complications:         No immediate complications. Procedure:             Pre-Anesthesia Assessment:                        - Prior to the procedure, a History and Physical was                         performed, and patient medications and allergies were                         reviewed. The patient's tolerance of previous                         anesthesia was also reviewed. The risks and benefits                         of the procedure and the sedation options and risks                         were discussed with the patient. All questions were                         answered, and informed consent was obtained. Prior                         Anticoagulants: The patient has taken no previous                         anticoagulant or antiplatelet agents. ASA Grade                         Assessment: II - A patient with mild systemic disease.                         After reviewing the risks and benefits, the patient                         was deemed in satisfactory condition to undergo the                         procedure.  After obtaining informed consent, the colonoscope was                         passed under direct vision. Throughout the procedure,                         the patient's blood pressure, pulse, and oxygen                         saturations were monitored continuously. The            Colonoscope was introduced through the anus with the                         intention of advancing to the cecum. The scope was                         advanced to the transverse colon before the procedure                         was aborted. Medications were given. The colonoscopy                         was performed without difficulty. The patient                         tolerated the procedure well. The quality of the bowel                         preparation was inadequate. Findings:      The perianal and digital rectal examinations were normal.      A large amount of stool was found in the entire colon. Impression:            - Preparation of the colon was inadequate.                        - Stool in the entire examined colon.                        - No specimens collected. Recommendation:        - Discharge patient to home.                        - Resume previous diet.                        - Continue present medications.                        - Repeat colonoscopy because the bowel preparation was                         poor. Procedure Code(s):     --- Professional ---                        [phone], 53, Colonoscopy, flexible; diagnostic,                         including collection of specimen(s) by brushing or  washing, when performed (separate procedure) Diagnosis Code(s):     --- Professional ---                        Z86.010, Personal history of colonic polyps CPT copyright 2019 American Medical Association. All rights reserved. The codes documented in this report are preliminary and upon coder review may  be revised to meet current compliance requirements. [name] MD, MD 09/10/2019 10:07:00 AM This report has been signed electronically. Number of Addenda: 0 Note Initiated On: 09/10/2019 9:49 AM Scope Withdrawal Time: 0 hours 0 minutes 1 second  Total Procedure Duration: 0 hours 6 minutes 0 seconds  Estimated Blood Loss:  Estimated  blood loss: none.      [facility]

## 2019-09-10 NOTE — Transfer of Care (Signed)
Immediate Anesthesia Transfer of Care Note  Patient: Paul Stokes  Procedure(s) Performed: COLONOSCOPY WITH PROPOFOL (N/A )  Patient Location: PACU  Anesthesia Type:General  Level of Consciousness: awake and sedated  Airway & Oxygen Therapy: Patient Spontanous Breathing and Patient connected to nasal cannula oxygen  Post-op Assessment: Report given to RN and Post -op Vital signs reviewed and stable  Post vital signs: Reviewed and stable  Last Vitals:  Vitals Value Taken Time  BP    Temp    Pulse    Resp    SpO2      Last Pain:  Vitals:   09/10/19 0847  TempSrc: Temporal         Complications: No apparent anesthesia complications

## 2019-09-10 NOTE — Anesthesia Preprocedure Evaluation (Signed)
Anesthesia Evaluation  Patient identified by MRN, date of birth, ID band Patient awake    Reviewed: Allergy & Precautions, NPO status , Patient's Chart, lab work & pertinent test results  History of Anesthesia Complications Negative for: history of anesthetic complications  Airway Mallampati: II  TM Distance: >3 FB Neck ROM: Full    Dental  (+) Poor Dentition   Pulmonary COPD,  COPD inhaler, former smoker,    breath sounds clear to auscultation- rhonchi (-) wheezing      Cardiovascular Exercise Tolerance: Good (-) hypertension(-) CAD, (-) Past MI, (-) Cardiac Stents and (-) CABG  Rhythm:Regular Rate:Normal - Systolic murmurs and - Diastolic murmurs    Neuro/Psych neg Seizures PSYCHIATRIC DISORDERS negative neurological ROS     GI/Hepatic Neg liver ROS, GERD  ,  Endo/Other  diabetes, Type 1, Insulin Dependent  Renal/GU negative Renal ROS     Musculoskeletal negative musculoskeletal ROS (+)   Abdominal (+) - obese,   Peds  Hematology negative hematology ROS (+)   Anesthesia Other Findings Past Medical History: No date: Diabetes mellitus without complication (HCC)     Comment:  insulin pump No date: Emphysema of lung (HCC) No date: GERD (gastroesophageal reflux disease) No date: Prostate disease   Reproductive/Obstetrics                             Anesthesia Physical Anesthesia Plan  ASA: III  Anesthesia Plan: General   Post-op Pain Management:    Induction: Intravenous  PONV Risk Score and Plan: 1 and Propofol infusion  Airway Management Planned: Natural Airway  Additional Equipment:   Intra-op Plan:   Post-operative Plan:   Informed Consent: I have reviewed the patients History and Physical, chart, labs and discussed the procedure including the risks, benefits and alternatives for the proposed anesthesia with the patient or authorized representative who has indicated  his/her understanding and acceptance.     Dental advisory given  Plan Discussed with: CRNA and Anesthesiologist  Anesthesia Plan Comments:         Anesthesia Quick Evaluation

## 2019-09-10 NOTE — H&P (Signed)
[name], MD [address] [place], [place] [address] Phone:[phone] Fax : [phone]  Primary Care Physician:  Glean Hess, MD Primary Gastroenterologist:  Dr. Allen Norris  Pre-Procedure History & Physical: HPI:  Paul Stokes is a 64 y.o. male is here for an colonoscopy.   Past Medical History:  Diagnosis Date  . Diabetes mellitus without complication (HCC)    insulin pump  . Emphysema of lung ([place])   . GERD (gastroesophageal reflux disease)   . Prostate disease     Past Surgical History:  Procedure Laterality Date  . COLONOSCOPY  08/2013   7 tubular adenomas    Prior to Admission medications   Medication Sig Start Date End Date Taking? Authorizing Provider  buPROPion [facility] SR) 150 MG 12 hr tablet TAKE 1 TABLET EVERY MORNING AND TAKE 2 TABLETS AT BEDTIME 07/25/19  Yes Glean Hess, MD  DULERA 200-5 MCG/ACT AERO TAKE 2 PUFFS INTO LUNGS DAILY 07/11/19  Yes Glean Hess, MD  gabapentin (NEURONTIN) 300 MG capsule TAKE 2 CAPSULES AT BEDTIME 07/24/19  Yes Glean Hess, MD  HUMALOG 100 UNIT/ML injection  03/06/19  Yes [provider]  pantoprazole (PROTONIX) 40 MG tablet Take 1 tablet (40 mg total) by mouth daily. 05/29/19  Yes Glean Hess, MD  pravastatin (PRAVACHOL) 40 MG tablet TAKE 1 TABLET DAILY 03/31/19  Yes Glean Hess, MD  tamsulosin [facility]) 0.4 MG CAPS capsule TAKE 1 CAPSULE AT BEDTIME 03/31/19  Yes Glean Hess, MD  zolpidem (AMBIEN) 10 MG tablet Take 1 tablet (10 mg total) by mouth at bedtime. 08/27/19  Yes Glean Hess, MD  albuterol (VENTOLIN HFA) 108 (90 Base) MCG/ACT inhaler Inhale 2 puffs into the lungs every 6 (six) hours as needed for wheezing or shortness of breath. 07/29/19   Glean Hess, MD  aspirin 81 MG tablet Take 1 tablet by mouth daily.    [provider]  BAYER CONTOUR NEXT TEST test strip  01/01/15   [provider]  Continuous Blood Gluc Sensor (FREESTYLE LIBRE  14 DAY SENSOR) MISC SMARTSIG:1 Kit(s) Topical Every 2 Weeks 04/10/19   [provider]  insulin aspart (NOVOLOG) 100 UNIT/ML injection Inject into the skin. Via insulin pump    [provider]  Na Sulfate-K Sulfate-Mg Sulf (SUPREP BOWEL PREP KIT) 17.5-3.13-1.6 GM/177ML SOLN Take 1 kit by mouth as directed. 08/20/19   [name], MD    Allergies as of 08/22/2019 - Review Complete 08/20/2019  Allergen Reaction Noted  . Clavulanic acid Nausea And Vomiting 10/26/2015  . Lisinopril Cough 11/15/2016    Family History  Problem Relation Age of Onset  . Diabetes Mother   . Diabetes Father     Social History   Socioeconomic History  . Marital status: Married    Spouse name: Not on file  . Number of children: Not on file  . Years of education: Not on file  . Highest education level: Not on file  Occupational History  . Not on file  Tobacco Use  . Smoking status: Former Smoker    Packs/day: 2.00    Years: 47.00    Pack years: 94.00    Types: Cigarettes    Start date: 05/12/1968    Quit date: 04/03/2019    Years since quitting: 0.4  . Smokeless tobacco: Never Used  Substance and Sexual Activity  . Alcohol use: No    Alcohol/week: 0.0 standard drinks  . Drug use: No  .  Sexual activity: Not on file  Other Topics Concern  . Not on file  Social History Narrative  . Not on file   Social Determinants of Health   Financial Resource Strain:   . Difficulty of Paying Living Expenses:   Food Insecurity:   . Worried About Charity fundraiser in the Last Year:   . Arboriculturist in the Last Year:   Transportation Needs:   . Film/video editor (Medical):   Marland Kitchen Lack of Transportation (Non-Medical):   Physical Activity:   . Days of Exercise per Week:   . Minutes of Exercise per Session:   Stress:   . Feeling of Stress :   Social Connections:   . Frequency of Communication with Friends and Family:   . Frequency of Social Gatherings with Friends and Family:   .  Attends Religious Services:   . Active Member of Clubs or Organizations:   . Attends Archivist Meetings:   Marland Kitchen Marital Status:   Intimate Partner Violence:   . Fear of Current or Ex-Partner:   . Emotionally Abused:   Marland Kitchen Physically Abused:   . Sexually Abused:     Review of Systems: See HPI, otherwise negative ROS  Physical Exam: BP 129/73   Pulse 80   Temp (!) 95.7 F (35.4 C) (Temporal)   Wt 81.6 kg   SpO2 99%   BMI 28.19 kg/m  General:   Alert,  pleasant and cooperative in NAD Head:  Normocephalic and atraumatic. Neck:  Supple; no masses or thyromegaly. Lungs:  Clear throughout to auscultation.    Heart:  Regular rate and rhythm. Abdomen:  Soft, nontender and nondistended. Normal bowel sounds, without guarding, and without rebound.   Neurologic:  Alert and  oriented x4;  grossly normal neurologically.  Impression/Plan: Paul Stokes is here for an colonoscopy to be performed for adenomatous polyps 08/14/2013  Risks, benefits, limitations, and alternatives regarding  colonoscopy have been reviewed with the patient.  Questions have been answered.  All parties agreeable.   [name], MD  09/10/2019, 9:46 AM

## 2019-09-11 ENCOUNTER — Ambulatory Visit
Admission: RE | Admit: 2019-09-11 | Discharge: 2019-09-11 | Disposition: A | Discharge status: Home / Self Care | Payer: 59 | Attending: Gastroenterology | Admitting: Gastroenterology

## 2019-09-11 ENCOUNTER — Ambulatory Visit: Payer: 59 | Admitting: Registered Nurse

## 2019-09-11 ENCOUNTER — Encounter: Payer: Self-pay | Admitting: *Deleted

## 2019-09-11 ENCOUNTER — Encounter
Admission: RE | Disposition: A | Discharge status: Home / Self Care | Payer: Self-pay | Source: Home / Self Care | Attending: Gastroenterology

## 2019-09-11 DIAGNOSIS — D122 Benign neoplasm of ascending colon: Secondary | ICD-10-CM | POA: Insufficient documentation

## 2019-09-11 DIAGNOSIS — Z1211 Encounter for screening for malignant neoplasm of colon: Secondary | ICD-10-CM | POA: Diagnosis present

## 2019-09-11 DIAGNOSIS — D120 Benign neoplasm of cecum: Secondary | ICD-10-CM | POA: Insufficient documentation

## 2019-09-11 DIAGNOSIS — Z87891 Personal history of nicotine dependence: Secondary | ICD-10-CM | POA: Insufficient documentation

## 2019-09-11 DIAGNOSIS — Z9641 Presence of insulin pump (external) (internal): Secondary | ICD-10-CM | POA: Insufficient documentation

## 2019-09-11 DIAGNOSIS — E119 Type 2 diabetes mellitus without complications: Secondary | ICD-10-CM | POA: Diagnosis not present

## 2019-09-11 DIAGNOSIS — J439 Emphysema, unspecified: Secondary | ICD-10-CM | POA: Diagnosis not present

## 2019-09-11 DIAGNOSIS — K635 Polyp of colon: Secondary | ICD-10-CM | POA: Diagnosis not present

## 2019-09-11 DIAGNOSIS — Z86010 Personal history of colonic polyps: Secondary | ICD-10-CM

## 2019-09-11 DIAGNOSIS — K219 Gastro-esophageal reflux disease without esophagitis: Secondary | ICD-10-CM | POA: Diagnosis not present

## 2019-09-11 HISTORY — PX: COLONOSCOPY WITH PROPOFOL: SHX5780

## 2019-09-11 LAB — GLUCOSE, CAPILLARY: Glucose-Capillary: 159 mg/dL — ABNORMAL HIGH (ref 70–99)

## 2019-09-11 SURGERY — COLONOSCOPY WITH PROPOFOL
Anesthesia: General

## 2019-09-11 MED ORDER — PROPOFOL 500 MG/50ML IV EMUL
INTRAVENOUS | Status: DC | PRN
  Administered 2019-09-11: 140 ug/kg/min via INTRAVENOUS

## 2019-09-11 MED ORDER — SODIUM CHLORIDE 0.9 % IV SOLN
INTRAVENOUS | Status: DC
  Administered 2019-09-11: 1000 mL via INTRAVENOUS

## 2019-09-11 MED ORDER — PROPOFOL 10 MG/ML IV BOLUS
INTRAVENOUS | Status: DC | PRN
  Administered 2019-09-11: 70 mg via INTRAVENOUS

## 2019-09-11 NOTE — Op Note (Signed)
[facility] Gastroenterology Patient Name: Paul Stokes Procedure Date: 09/11/2019 12:37 PM MRN: [id number] Account #: [id number] Date of Birth: 31-May-1955 Admit Type: Outpatient Age: 64 Room: [facility] ENDO ROOM 4 Gender: Male Note Status: Finalized Procedure:             Colonoscopy Indications:           High risk colon cancer surveillance: Personal history                         of colonic polyps Providers:             Jonathon Bellows MD, MD Referring MD:          Halina Maidens, MD (Referring MD) Medicines:             Monitored Anesthesia Care Complications:         No immediate complications. Procedure:             Pre-Anesthesia Assessment:                        - Prior to the procedure, a History and Physical was                         performed, and patient medications, allergies and                         sensitivities were reviewed. The patient's tolerance                         of previous anesthesia was reviewed.                        - The risks and benefits of the procedure and the                         sedation options and risks were discussed with the                         patient. All questions were answered and informed                         consent was obtained.                        - ASA Grade Assessment: II - A patient with mild                         systemic disease.                        After obtaining informed consent, the colonoscope was                         passed under direct vision. Throughout the procedure,                         the patient's blood pressure, pulse, and oxygen                         saturations were monitored continuously. The  Colonoscope was introduced through the anus and                         advanced to the the cecum, identified by the                         appendiceal orifice. The colonoscopy was performed                         with ease. The patient tolerated the  procedure well.                         The quality of the bowel preparation was adequate. Findings:      The perianal and digital rectal examinations were normal.      Two sessile polyps were found in the ascending colon and cecum. The       polyps were 4 to 5 mm in size. These polyps were removed with a cold       snare. Resection and retrieval were complete.      The exam was otherwise without abnormality on direct and retroflexion       views. Impression:            - Two 4 to 5 mm polyps in the ascending colon and in                         the cecum, removed with a cold snare. Resected and                         retrieved.                        - The examination was otherwise normal on direct and                         retroflexion views. Recommendation:        - Discharge patient to home (with escort).                        - Resume previous diet.                        - Continue present medications.                        - Await pathology results.                        - Repeat colonoscopy in 5 years for surveillance. Procedure Code(s):     --- Professional ---                        [phone], Colonoscopy, flexible; with removal of                         tumor(s), polyp(s), or other lesion(s) by snare                         technique Diagnosis Code(s):     --- Professional ---  Z86.010, Personal history of colonic polyps                        K63.5, Polyp of colon CPT copyright 2019 American Medical Association. All rights reserved. The codes documented in this report are preliminary and upon coder review may  be revised to meet current compliance requirements. Jonathon Bellows, MD Jonathon Bellows MD, MD 09/11/2019 1:08:58 PM This report has been signed electronically. Number of Addenda: 0 Note Initiated On: 09/11/2019 12:37 PM Scope Withdrawal Time: 0 hours 17 minutes 15 seconds  Total Procedure Duration: 0 hours 23 minutes 22 seconds  Estimated Blood  Loss:  Estimated blood loss: none.      [facility]

## 2019-09-11 NOTE — Transfer of Care (Signed)
Immediate Anesthesia Transfer of Care Note  Patient: Paul Stokes  Procedure(s) Performed: COLONOSCOPY WITH PROPOFOL (N/A )  Patient Location: PACU  Anesthesia Type:General  Level of Consciousness: sedated  Airway & Oxygen Therapy: Patient Spontanous Breathing  Post-op Assessment: Report given to RN and Post -op Vital signs reviewed and stable  Post vital signs: Reviewed and stable  Last Vitals:  Vitals Value Taken Time  BP    Temp    Pulse    Resp    SpO2      Last Pain:  Vitals:   09/11/19 1210  TempSrc: Temporal  PainSc: 0-No pain         Complications: No apparent anesthesia complications

## 2019-09-11 NOTE — Anesthesia Preprocedure Evaluation (Signed)
Anesthesia Evaluation  Patient identified by MRN, date of birth, ID band Patient awake    Reviewed: Allergy & Precautions, NPO status , Patient's Chart, lab work & pertinent test results  History of Anesthesia Complications Negative for: history of anesthetic complications  Airway Mallampati: II  TM Distance: >3 FB Neck ROM: Full    Dental  (+) Poor Dentition   Pulmonary COPD,  COPD inhaler, former smoker,    breath sounds clear to auscultation- rhonchi (-) wheezing      Cardiovascular Exercise Tolerance: Good (-) hypertension(-) CAD, (-) Past MI, (-) Cardiac Stents and (-) CABG  Rhythm:Regular Rate:Normal - Systolic murmurs and - Diastolic murmurs    Neuro/Psych neg Seizures PSYCHIATRIC DISORDERS negative neurological ROS     GI/Hepatic Neg liver ROS, GERD  ,  Endo/Other  diabetes, Type 1, Insulin Dependent  Renal/GU negative Renal ROS     Musculoskeletal negative musculoskeletal ROS (+)   Abdominal (+) - obese,   Peds  Hematology negative hematology ROS (+)   Anesthesia Other Findings Past Medical History: No date: Diabetes mellitus without complication (HCC)     Comment:  insulin pump No date: Emphysema of lung (HCC) No date: GERD (gastroesophageal reflux disease) No date: Prostate disease   Reproductive/Obstetrics                             Anesthesia Physical  Anesthesia Plan  ASA: III  Anesthesia Plan: General   Post-op Pain Management:    Induction: Intravenous  PONV Risk Score and Plan: 1 and Propofol infusion  Airway Management Planned: Natural Airway  Additional Equipment:   Intra-op Plan:   Post-operative Plan:   Informed Consent: I have reviewed the patients History and Physical, chart, labs and discussed the procedure including the risks, benefits and alternatives for the proposed anesthesia with the patient or authorized representative who has indicated  his/her understanding and acceptance.     Dental advisory given  Plan Discussed with: CRNA and Anesthesiologist  Anesthesia Plan Comments:         Anesthesia Quick Evaluation

## 2019-09-11 NOTE — H&P (Signed)
Jonathon Bellows, MD [address], [place], [place], [place], [address] [address], [place], [place], [place], [address] Phone: [phone]  Fax: [phone]  Primary Care Physician:  Glean Hess, MD   Pre-Procedure History & Physical: HPI:  Paul Stokes is a 64 y.o. male is here for an colonoscopy.   Past Medical History:  Diagnosis Date  . Diabetes mellitus without complication (HCC)    insulin pump  . Emphysema of lung ([place])   . GERD (gastroesophageal reflux disease)   . Prostate disease     Past Surgical History:  Procedure Laterality Date  . COLONOSCOPY  08/2013   7 tubular adenomas  . COLONOSCOPY WITH PROPOFOL N/A 09/10/2019   Procedure: COLONOSCOPY WITH PROPOFOL;  Surgeon: [name], MD;  Location: [facility] ENDOSCOPY;  Service: Endoscopy;  Laterality: N/A;    Prior to Admission medications   Medication Sig Start Date End Date Taking? Authorizing Provider  albuterol (VENTOLIN HFA) 108 (90 Base) MCG/ACT inhaler Inhale 2 puffs into the lungs every 6 (six) hours as needed for wheezing or shortness of breath. 07/29/19  Yes Glean Hess, MD  BAYER CONTOUR NEXT TEST test strip  01/01/15  Yes [provider]  buPROPion (WELLBUTRIN SR) 150 MG 12 hr tablet TAKE 1 TABLET EVERY MORNING AND TAKE 2 TABLETS AT BEDTIME 07/25/19  Yes Glean Hess, MD  Continuous Blood Gluc Sensor (FREESTYLE LIBRE 14 DAY SENSOR) MISC SMARTSIG:1 Kit(s) Topical Every 2 Weeks 04/10/19  Yes [provider]  DULERA 200-5 MCG/ACT AERO TAKE 2 PUFFS INTO LUNGS DAILY 07/11/19  Yes Glean Hess, MD  gabapentin (NEURONTIN) 300 MG capsule TAKE 2 CAPSULES AT BEDTIME 07/24/19  Yes Glean Hess, MD  HUMALOG 100 UNIT/ML injection  03/06/19  Yes [provider]  insulin aspart (NOVOLOG) 100 UNIT/ML injection Inject into the skin. Via insulin pump   Yes [provider]  Na Sulfate-K Sulfate-Mg Sulf (SUPREP BOWEL PREP KIT) 17.5-3.13-1.6 GM/177ML SOLN Take 1 kit by  mouth as directed. 08/20/19  Yes [name], MD  pantoprazole (PROTONIX) 40 MG tablet Take 1 tablet (40 mg total) by mouth daily. 05/29/19  Yes Glean Hess, MD  pravastatin (PRAVACHOL) 40 MG tablet TAKE 1 TABLET DAILY 03/31/19  Yes Glean Hess, MD  tamsulosin [facility]) 0.4 MG CAPS capsule TAKE 1 CAPSULE AT BEDTIME 03/31/19  Yes Glean Hess, MD  zolpidem (AMBIEN) 10 MG tablet Take 1 tablet (10 mg total) by mouth at bedtime. 08/27/19  Yes Glean Hess, MD  aspirin 81 MG tablet Take 1 tablet by mouth daily.    [provider]    Allergies as of 09/10/2019 - Review Complete 09/10/2019  Allergen Reaction Noted  . Clavulanic acid Nausea And Vomiting 10/26/2015  . Lisinopril Cough 11/15/2016    Family History  Problem Relation Age of Onset  . Diabetes Mother   . Diabetes Father     Social History   Socioeconomic History  . Marital status: Married    Spouse name: Not on file  . Number of children: Not on file  . Years of education: Not on file  . Highest education level: Not on file  Occupational History  . Not on file  Tobacco Use  . Smoking status: Former Smoker    Packs/day: 2.00    Years: 47.00    Pack years: 94.00    Types: Cigarettes    Start date: 05/12/1968    Quit date: 04/03/2019    Years since  quitting: 0.4  . Smokeless tobacco: Never Used  Substance and Sexual Activity  . Alcohol use: No    Alcohol/week: 0.0 standard drinks  . Drug use: No  . Sexual activity: Not on file  Other Topics Concern  . Not on file  Social History Narrative  . Not on file   Social Determinants of Health   Financial Resource Strain:   . Difficulty of Paying Living Expenses:   Food Insecurity:   . Worried About Charity fundraiser in the Last Year:   . Arboriculturist in the Last Year:   Transportation Needs:   . Film/video editor (Medical):   Marland Kitchen Lack of Transportation (Non-Medical):   Physical Activity:   . Days of Exercise per Week:   .  Minutes of Exercise per Session:   Stress:   . Feeling of Stress :   Social Connections:   . Frequency of Communication with Friends and Family:   . Frequency of Social Gatherings with Friends and Family:   . Attends Religious Services:   . Active Member of Clubs or Organizations:   . Attends Archivist Meetings:   Marland Kitchen Marital Status:   Intimate Partner Violence:   . Fear of Current or Ex-Partner:   . Emotionally Abused:   Marland Kitchen Physically Abused:   . Sexually Abused:     Review of Systems: See HPI, otherwise negative ROS  Physical Exam: BP (!) 157/62   Pulse 71   Temp (!) 97.4 F (36.3 C) (Temporal)   Resp 17   Ht '5\' 7"'$  (1.702 m)   Wt 81.6 kg   SpO2 100%   BMI 28.19 kg/m  General:   Alert,  pleasant and cooperative in NAD Head:  Normocephalic and atraumatic. Neck:  Supple; no masses or thyromegaly. Lungs:  Clear throughout to auscultation, normal respiratory effort.    Heart:  +S1, +S2, Regular rate and rhythm, No edema. Abdomen:  Soft, nontender and nondistended. Normal bowel sounds, without guarding, and without rebound.   Neurologic:  Alert and  oriented x4;  grossly normal neurologically.  Impression/Plan: Paul Stokes is here for an colonoscopy to be performed for surveillance due to prior history of colon polyps   Risks, benefits, limitations, and alternatives regarding  colonoscopy have been reviewed with the patient.  Questions have been answered.  All parties agreeable.   Jonathon Bellows, MD  09/11/2019, 12:38 PM

## 2019-09-11 NOTE — Anesthesia Postprocedure Evaluation (Signed)
Anesthesia Post Note  Patient: Paul Stokes  Procedure(s) Performed: COLONOSCOPY WITH PROPOFOL (N/A )  Patient location during evaluation: Endoscopy Anesthesia Type: General Level of consciousness: awake and alert and oriented Pain management: pain level controlled Vital Signs Assessment: post-procedure vital signs reviewed and stable Respiratory status: spontaneous breathing, nonlabored ventilation and respiratory function stable Cardiovascular status: blood pressure returned to baseline and stable Postop Assessment: no signs of nausea or vomiting Anesthetic complications: no     Last Vitals:  Vitals:   09/11/19 1330 09/11/19 1340  BP: 130/85 133/84  Pulse: 82 69  Resp: 14 13  Temp:    SpO2: 100% 100%    Last Pain:  Vitals:   09/11/19 1340  TempSrc:   PainSc: 0-No pain                 Cristofer Yaffe

## 2019-09-12 ENCOUNTER — Encounter: Payer: Self-pay | Admitting: Gastroenterology

## 2019-09-12 ENCOUNTER — Encounter: Payer: Self-pay | Admitting: *Deleted

## 2019-09-12 LAB — SURGICAL PATHOLOGY

## 2019-09-25 ENCOUNTER — Emergency Department
Admission: EM | Admit: 2019-09-25 | Discharge: 2019-09-25 | Disposition: A | Discharge status: Home / Self Care | Payer: 59 | Attending: Student in an Organized Health Care Education/Training Program | Admitting: Student in an Organized Health Care Education/Training Program

## 2019-09-25 ENCOUNTER — Encounter: Payer: Self-pay | Admitting: Emergency Medicine

## 2019-09-25 ENCOUNTER — Other Ambulatory Visit: Payer: Self-pay

## 2019-09-25 DIAGNOSIS — E1040 Type 1 diabetes mellitus with diabetic neuropathy, unspecified: Secondary | ICD-10-CM | POA: Diagnosis not present

## 2019-09-25 DIAGNOSIS — Z9641 Presence of insulin pump (external) (internal): Secondary | ICD-10-CM | POA: Insufficient documentation

## 2019-09-25 DIAGNOSIS — Z87891 Personal history of nicotine dependence: Secondary | ICD-10-CM | POA: Insufficient documentation

## 2019-09-25 DIAGNOSIS — Z79899 Other long term (current) drug therapy: Secondary | ICD-10-CM | POA: Diagnosis not present

## 2019-09-25 DIAGNOSIS — M5440 Lumbago with sciatica, unspecified side: Secondary | ICD-10-CM | POA: Diagnosis not present

## 2019-09-25 DIAGNOSIS — M545 Low back pain: Secondary | ICD-10-CM | POA: Diagnosis present

## 2019-09-25 DIAGNOSIS — Z7982 Long term (current) use of aspirin: Secondary | ICD-10-CM | POA: Insufficient documentation

## 2019-09-25 MED ORDER — KETOROLAC TROMETHAMINE 30 MG/ML IJ SOLN
30.0000 mg | Freq: Once | INTRAMUSCULAR | Status: AC
  Administered 2019-09-25: 30 mg via INTRAMUSCULAR
  Filled 2019-09-25: qty 1

## 2019-09-25 MED ORDER — OXYCODONE-ACETAMINOPHEN 5-325 MG PO TABS
1.0000 | ORAL_TABLET | Freq: Once | ORAL | Status: AC
  Administered 2019-09-25: 1 via ORAL
  Filled 2019-09-25: qty 1

## 2019-09-25 MED ORDER — ONDANSETRON 4 MG PO TBDP
4.0000 mg | ORAL_TABLET | Freq: Once | ORAL | Status: AC
  Administered 2019-09-25: 4 mg via ORAL
  Filled 2019-09-25: qty 1

## 2019-09-25 MED ORDER — OXYCODONE-ACETAMINOPHEN 5-325 MG PO TABS: 1.0000 | ORAL_TABLET | Freq: Three times a day (TID) | ORAL | 0 refills | Status: AC | PRN

## 2019-09-25 NOTE — ED Provider Notes (Signed)
Emergency Department Provider Note  ____________________________________________  Time seen: Approximately 7:57 PM  I have reviewed the triage vital signs and the nursing notes.   HISTORY  Chief Complaint Sciatica   Historian Patient    HPI Paul Stokes is a 64 y.o. male presents to the emergency department with right-sided low back pain with radiation of the pain along the posterior aspect of the right lower extremity.  Patient states that he has a history of sciatica and has experienced similar episodes of discomfort in the past.  He was seen at a local urgent care earlier in the day was given an injection of Toradol and started on baclofen and prednisone.  He states he has picked up his medications and his symptoms have not improved.  No bowel or bladder incontinence or saddle anesthesia.  No other alleviating measures have been attempted.   Past Medical History:  Diagnosis Date  . Diabetes mellitus without complication (HCC)    insulin pump  . Emphysema of lung ([place])   . GERD (gastroesophageal reflux disease)   . Prostate disease      Immunizations up to date:  Yes.     Past Medical History:  Diagnosis Date  . Diabetes mellitus without complication (HCC)    insulin pump  . Emphysema of lung ([place])   . GERD (gastroesophageal reflux disease)   . Prostate disease     Patient Active Problem List   Diagnosis Date Noted  . Personal history of colonic polyps   . Aortic atherosclerosis ([place]) 08/27/2019  . Dyspepsia 08/06/2018  . Acute bilateral low back pain with right-sided sciatica 11/29/2017  . Mood disorder ([place]) 11/15/2016  . BPH with obstruction/lower urinary tract symptoms 06/15/2015  . Hyperlipidemia 06/15/2015  . Insomnia, persistent 03/06/2015  . Compulsive tobacco user syndrome 03/06/2015  . Type 1 diabetes mellitus with sensory neuropathy ([place]) 03/06/2015  . Presence of insulin pump 06/08/2014    Past Surgical History:  Procedure Laterality Date   . COLONOSCOPY  08/2013   7 tubular adenomas  . COLONOSCOPY WITH PROPOFOL N/A 09/10/2019   Procedure: COLONOSCOPY WITH PROPOFOL;  Surgeon: [name], MD;  [facility] ENDOSCOPY;  Service: Endoscopy;  Laterality: N/A;  . COLONOSCOPY WITH PROPOFOL N/A 09/11/2019   Procedure: COLONOSCOPY WITH PROPOFOL;  Surgeon: Jonathon Bellows, MD;  Location: [facility] ENDOSCOPY;  Service: Gastroenterology;  Laterality: N/A;    Prior to Admission medications   Medication Sig Start Date End Date Taking? Authorizing Provider  albuterol (VENTOLIN HFA) 108 (90 Base) MCG/ACT inhaler Inhale 2 puffs into the lungs every 6 (six) hours as needed for wheezing or shortness of breath. 07/29/19   Glean Hess, MD  aspirin 81 MG tablet Take 1 tablet by mouth daily.    [provider]  BAYER CONTOUR NEXT TEST test strip  01/01/15   [provider]  buPROPion (WELLBUTRIN SR) 150 MG 12 hr tablet TAKE 1 TABLET EVERY MORNING AND TAKE 2 TABLETS AT BEDTIME 07/25/19   Glean Hess, MD  Continuous Blood Gluc Sensor (FREESTYLE LIBRE 14 DAY SENSOR) MISC SMARTSIG:1 Kit(s) Topical Every 2 Weeks 04/10/19   [provider]  DULERA 200-5 MCG/ACT AERO TAKE 2 PUFFS INTO LUNGS DAILY 07/11/19   Glean Hess, MD  gabapentin (NEURONTIN) 300 MG capsule TAKE 2 CAPSULES AT BEDTIME 07/24/19   Glean Hess, MD  HUMALOG 100 UNIT/ML injection  03/06/19   [provider]  insulin aspart (NOVOLOG) 100 UNIT/ML injection Inject into the skin. Via insulin pump  [provider]  Na Sulfate-K Sulfate-Mg Sulf (SUPREP BOWEL PREP KIT) 17.5-3.13-1.6 GM/177ML SOLN Take 1 kit by mouth as directed. 08/20/19   [name], MD  oxyCODONE-acetaminophen (PERCOCET/ROXICET) 5-325 MG tablet Take 1 tablet by mouth every 8 (eight) hours as needed for up to 3 days. 09/25/19 09/28/19  Lannie Fields, PA-C  pantoprazole (PROTONIX) 40 MG tablet Take 1 tablet (40 mg total) by mouth daily. 05/29/19   Glean Hess, MD   pravastatin (PRAVACHOL) 40 MG tablet TAKE 1 TABLET DAILY 03/31/19   Glean Hess, MD  tamsulosin [facility]) 0.4 MG CAPS capsule TAKE 1 CAPSULE AT BEDTIME 03/31/19   Glean Hess, MD  zolpidem (AMBIEN) 10 MG tablet Take 1 tablet (10 mg total) by mouth at bedtime. 08/27/19   Glean Hess, MD    Allergies Clavulanic acid and Lisinopril  Family History  Problem Relation Age of Onset  . Diabetes Mother   . Diabetes Father     Social History Social History   Tobacco Use  . Smoking status: Former Smoker    Packs/day: 2.00    Years: 47.00    Pack years: 94.00    Types: Cigarettes    Start date: 05/12/1968    Quit date: 04/03/2019    Years since quitting: 0.4  . Smokeless tobacco: Never Used  Substance Use Topics  . Alcohol use: No    Alcohol/week: 0.0 standard drinks  . Drug use: No     Review of Systems  Constitutional: No fever/chills Eyes:  No discharge ENT: No upper respiratory complaints. Respiratory: no cough. No SOB/ use of accessory muscles to breath Gastrointestinal:   No nausea, no vomiting.  No diarrhea.  No constipation. Musculoskeletal: Patient has low back pain. Skin: Negative for rash, abrasions, lacerations, ecchymosis.    ____________________________________________   PHYSICAL EXAM:  VITAL SIGNS: ED Triage Vitals  Enc Vitals Group     BP 09/25/19 1926 (!) 161/77     Pulse Rate 09/25/19 1926 92     Resp 09/25/19 1926 18     Temp 09/25/19 1926 98.3 F (36.8 C)     Temp Source 09/25/19 1926 Oral     SpO2 09/25/19 1926 96 %     Weight 09/25/19 1926 180 lb (81.6 kg)     Height 09/25/19 1926 '5\' 7"'$  (1.702 m)     Head Circumference --      Peak Flow --      Pain Score 09/25/19 1936 5     Pain Loc --      Pain Edu? --      Excl. in [place]? --      Constitutional: Alert and oriented. Well appearing and in no acute distress. Eyes: Conjunctivae are normal. PERRL. EOMI. Head: Atraumatic. ENT:      Ears:       Nose: No  congestion/rhinnorhea.      Mouth/Throat: Mucous membranes are moist.  Neck: No stridor.  No cervical spine tenderness to palpation. Cardiovascular: Normal rate, regular rhythm. Normal S1 and S2.  Good peripheral circulation. Respiratory: Normal respiratory effort without tachypnea or retractions. Lungs CTAB. Good air entry to the bases with no decreased or absent breath sounds Gastrointestinal: Bowel sounds x 4 quadrants. Soft and nontender to palpation. No guarding or rigidity. No distention. Musculoskeletal: Full range of motion to all extremities. No obvious deformities noted.  Patient has positive straight leg raise test on the right. Neurologic:  Normal for age. No gross focal neurologic deficits are  appreciated.  Skin:  Skin is warm, dry and intact. No rash noted. Psychiatric: Mood and affect are normal for age. Speech and behavior are normal.   ____________________________________________   LABS (all labs ordered are listed, but only abnormal results are displayed)  Labs Reviewed - No data to display ____________________________________________  EKG   ____________________________________________  RADIOLOGY  No results found.  ____________________________________________    PROCEDURES  Procedure(s) performed:     Procedures     Medications  oxyCODONE-acetaminophen (PERCOCET/ROXICET) 5-325 MG per tablet 1 tablet (1 tablet Oral Given 09/25/19 2003)  ondansetron (ZOFRAN-ODT) disintegrating tablet 4 mg (4 mg Oral Given 09/25/19 2003)  ketorolac (TORADOL) 30 MG/ML injection 30 mg (30 mg Intramuscular Given 09/25/19 2008)     ____________________________________________   INITIAL IMPRESSION / ASSESSMENT AND PLAN / ED COURSE  Pertinent labs & imaging results that were available during my care of the patient were reviewed by me and considered in my medical decision making (see chart for details).      Assessment and plan Low back pain 64 year old male presents  to the emergency department with low back pain that radiates down the posterior aspect of the right lower extremity.  Patient a positive straight leg raise test on the right.  History and physical exam findings suggest sciatica.  Patient reported that his pain improved significantly after Roxicet and Toradol were given in the emergency department.  He was advised to continue taking prednisone as directed by urgent care provider and to keep his follow-up appointment with neurosurgery later this month.  Return precautions were given to return with new or worsening symptoms.  All patient questions were answered.    ____________________________________________  FINAL CLINICAL IMPRESSION(S) / ED DIAGNOSES  Final diagnoses:  Acute low back pain with sciatica, sciatica laterality unspecified, unspecified back pain laterality      NEW MEDICATIONS STARTED DURING THIS VISIT:  ED Discharge Orders         Ordered    oxyCODONE-acetaminophen (PERCOCET/ROXICET) 5-325 MG tablet  Every 8 hours PRN     09/25/19 2052              This chart was dictated using voice recognition software/Dragon. Despite best efforts to proofread, errors can occur which can change the meaning. Any change was purely unintentional.     Karren Cobble 09/25/19 2056    Merlyn Lot, MD 09/25/19 2129

## 2019-09-25 NOTE — ED Notes (Signed)
Pt to room via wheelchair, pt ambulated to bench in room with limp. Pt reports right leg pain "due to sciatica" x 2 weeks. Was given shots and pain meds by [facility] clinic yesterday but it has not helped

## 2019-09-25 NOTE — ED Triage Notes (Signed)
Pt to ED from home c/o right sided sciatic pain radiating down right leg.  Pt has hx of same.

## 2019-09-29 ENCOUNTER — Other Ambulatory Visit: Payer: Self-pay

## 2019-09-29 ENCOUNTER — Emergency Department
Admission: EM | Admit: 2019-09-29 | Discharge: 2019-09-29 | Disposition: A | Discharge status: Home / Self Care | Payer: 59 | Attending: Emergency Medicine | Admitting: Emergency Medicine

## 2019-09-29 DIAGNOSIS — Z87891 Personal history of nicotine dependence: Secondary | ICD-10-CM | POA: Insufficient documentation

## 2019-09-29 DIAGNOSIS — G8929 Other chronic pain: Secondary | ICD-10-CM

## 2019-09-29 DIAGNOSIS — Z79899 Other long term (current) drug therapy: Secondary | ICD-10-CM | POA: Diagnosis not present

## 2019-09-29 DIAGNOSIS — E109 Type 1 diabetes mellitus without complications: Secondary | ICD-10-CM | POA: Diagnosis not present

## 2019-09-29 DIAGNOSIS — M545 Low back pain: Secondary | ICD-10-CM | POA: Diagnosis present

## 2019-09-29 DIAGNOSIS — Z7982 Long term (current) use of aspirin: Secondary | ICD-10-CM | POA: Insufficient documentation

## 2019-09-29 DIAGNOSIS — M5441 Lumbago with sciatica, right side: Secondary | ICD-10-CM | POA: Insufficient documentation

## 2019-09-29 DIAGNOSIS — Z794 Long term (current) use of insulin: Secondary | ICD-10-CM | POA: Diagnosis not present

## 2019-09-29 LAB — BASIC METABOLIC PANEL
Anion gap: 11 (ref 5–15)
BUN: 29 mg/dL — ABNORMAL HIGH (ref 8–23)
CO2: 24 mmol/L (ref 22–32)
Calcium: 9.1 mg/dL (ref 8.9–10.3)
Chloride: 100 mmol/L (ref 98–111)
Creatinine, Ser: 0.98 mg/dL (ref 0.61–1.24)
GFR calc Af Amer: >60 mL/min (ref >60)
GFR calc non Af Amer: >60 mL/min (ref >60)
Glucose, Bld: 363 mg/dL — ABNORMAL HIGH (ref 70–99)
Potassium: 4.7 mmol/L (ref 3.5–5.1)
Sodium: 135 mmol/L (ref 135–145)

## 2019-09-29 LAB — CBC WITH DIFFERENTIAL/PLATELET
Abs Immature Granulocytes: 0.07 10*3/uL (ref 0.00–0.07)
Basophils Absolute: 0.1 10*3/uL (ref 0.0–0.1)
Basophils Relative: 1 %
Eosinophils Absolute: 0.1 10*3/uL (ref 0.0–0.5)
Eosinophils Relative: 1 %
HCT: 44.4 % (ref 39.0–52.0)
Hemoglobin: 15.2 g/dL (ref 13.0–17.0)
Immature Granulocytes: 1 %
Lymphocytes Relative: 23 %
Lymphs Abs: 2 10*3/uL (ref 0.7–4.0)
MCH: 29.3 pg (ref 26.0–34.0)
MCHC: 34.2 g/dL (ref 30.0–36.0)
MCV: 85.7 fL (ref 80.0–100.0)
Monocytes Absolute: 0.7 10*3/uL (ref 0.1–1.0)
Monocytes Relative: 7 %
Neutro Abs: 6.1 10*3/uL (ref 1.7–7.7)
Neutrophils Relative %: 67 %
Platelets: 392 10*3/uL (ref 150–400)
RBC: 5.18 MIL/uL (ref 4.22–5.81)
RDW: 11.1 % — ABNORMAL LOW (ref 11.5–15.5)
WBC: 9 10*3/uL (ref 4.0–10.5)
nRBC: 0 % (ref 0.0–0.2)

## 2019-09-29 LAB — SEDIMENTATION RATE: Sed Rate: 5 mm/hr (ref 0–20)

## 2019-09-29 LAB — C-REACTIVE PROTEIN: CRP: 0.9 mg/dL (ref <1.0)

## 2019-09-29 MED ORDER — ORPHENADRINE CITRATE 30 MG/ML IJ SOLN
60.0000 mg | INTRAMUSCULAR | Status: AC
  Administered 2019-09-29: 60 mg via INTRAVENOUS
  Filled 2019-09-29: qty 2

## 2019-09-29 MED ORDER — ONDANSETRON HCL 4 MG/2ML IJ SOLN
4.0000 mg | Freq: Once | INTRAMUSCULAR | Status: AC
  Administered 2019-09-29: 4 mg via INTRAVENOUS
  Filled 2019-09-29: qty 2

## 2019-09-29 MED ORDER — KETOROLAC TROMETHAMINE 30 MG/ML IJ SOLN
30.0000 mg | Freq: Once | INTRAMUSCULAR | Status: AC
  Administered 2019-09-29: 30 mg via INTRAVENOUS
  Filled 2019-09-29: qty 1

## 2019-09-29 MED ORDER — KETOROLAC TROMETHAMINE 10 MG PO TABS: 10.0000 mg | ORAL_TABLET | Freq: Three times a day (TID) | ORAL | 0 refills | Status: DC

## 2019-09-29 MED ORDER — HYDROMORPHONE HCL 1 MG/ML IJ SOLN
1.0000 mg | Freq: Once | INTRAMUSCULAR | Status: AC
  Administered 2019-09-29: 1 mg via INTRAVENOUS
  Filled 2019-09-29: qty 1

## 2019-09-29 MED ORDER — ORPHENADRINE CITRATE ER 100 MG PO TB12: 100.0000 mg | ORAL_TABLET | Freq: Two times a day (BID) | ORAL | 0 refills | Status: DC | PRN

## 2019-09-29 NOTE — ED Triage Notes (Signed)
Pt her for c/o right sided sciatic pain that radiates down to right leg. Pt states he just can't do anything and the pain is hurting more and more.  Pt denies any recent injuries,.

## 2019-09-29 NOTE — Discharge Instructions (Signed)
Your exam and labs are essentially normal at this time. Take the prescription meds as directed. Follow-up with Dr. Sharlet Salina as discussed. Consider up-dosing your Gabapentin over the next week, with increased dosing every 3 days, as discussed. Return to the ED as needed.

## 2019-09-29 NOTE — ED Provider Notes (Signed)
[facility] Emergency Department Provider Note ____________________________________________  Time seen: 1625  I have reviewed the triage vital signs and the nursing notes.  HISTORY  Chief Complaint  Back Pain  HPI Paul Stokes is a 64 y.o. male with a history of radiculitis  and lumbar HNP, presents to the ED for evaluation of chronic right sided leg pain.  Denies any interim falls, trauma, or injury.  He denies any fever, chills, sweats patient also denies any bladder bowel incontinence, foot drop, or saddle anesthesias.  He is 3 days status post a right L5-S1 transforaminal ESI with dexamethasone, performed by Dr. Sharlet Salina.  He is scheduled for a repeat procedure in 3 to 4 weeks.  Patient describes about 24 hours of significant pain improvement, but reports sudden onset of right calf pain and paresthesias.  He describes pain is more than what he typically experiences, and has had difficulty with attempts to bear weight through the foot and leg.  He denies any focal back pain at the injection site, fevers, chills, or sweats.   Past Medical History:  Diagnosis Date  . Diabetes mellitus without complication (HCC)    insulin pump  . Emphysema of lung ([place])   . GERD (gastroesophageal reflux disease)   . Prostate disease     Patient Active Problem List   Diagnosis Date Noted  . Personal history of colonic polyps   . Aortic atherosclerosis ([place]) 08/27/2019  . Dyspepsia 08/06/2018  . Acute bilateral low back pain with right-sided sciatica 11/29/2017  . Mood disorder ([place]) 11/15/2016  . BPH with obstruction/lower urinary tract symptoms 06/15/2015  . Hyperlipidemia 06/15/2015  . Insomnia, persistent 03/06/2015  . Compulsive tobacco user syndrome 03/06/2015  . Type 1 diabetes mellitus with sensory neuropathy ([place]) 03/06/2015  . Presence of insulin pump 06/08/2014    Past Surgical History:  Procedure Laterality Date  . COLONOSCOPY  08/2013   7 tubular adenomas   . COLONOSCOPY WITH PROPOFOL N/A 09/10/2019   Procedure: COLONOSCOPY WITH PROPOFOL;  Surgeon: [name], MD;  Location: [facility] ENDOSCOPY;  Service: Endoscopy;  Laterality: N/A;  . COLONOSCOPY WITH PROPOFOL N/A 09/11/2019   Procedure: COLONOSCOPY WITH PROPOFOL;  Surgeon: Jonathon Bellows, MD;  Location: [facility] ENDOSCOPY;  Service: Gastroenterology;  Laterality: N/A;    Prior to Admission medications   Medication Sig Start Date End Date Taking? Authorizing Provider  albuterol (VENTOLIN HFA) 108 (90 Base) MCG/ACT inhaler Inhale 2 puffs into the lungs every 6 (six) hours as needed for wheezing or shortness of breath. 07/29/19   Glean Hess, MD  aspirin 81 MG tablet Take 1 tablet by mouth daily.    [provider]  BAYER CONTOUR NEXT TEST test strip  01/01/15   [provider]  buPROPion (WELLBUTRIN SR) 150 MG 12 hr tablet TAKE 1 TABLET EVERY MORNING AND TAKE 2 TABLETS AT BEDTIME 07/25/19   Glean Hess, MD  Continuous Blood Gluc Sensor (FREESTYLE LIBRE 14 DAY SENSOR) MISC SMARTSIG:1 Kit(s) Topical Every 2 Weeks 04/10/19   [provider]  DULERA 200-5 MCG/ACT AERO TAKE 2 PUFFS INTO LUNGS DAILY 07/11/19   Glean Hess, MD  gabapentin (NEURONTIN) 300 MG capsule TAKE 2 CAPSULES AT BEDTIME 07/24/19   Glean Hess, MD  HUMALOG 100 UNIT/ML injection  03/06/19   [provider]  insulin aspart (NOVOLOG) 100 UNIT/ML injection Inject into the skin. Via insulin pump    [provider]  ketorolac (TORADOL) 10 MG tablet Take 1 tablet (10 mg  total) by mouth every 8 (eight) hours. 09/29/19   Merwyn Hodapp, Dannielle Karvonen, PA-C  Na Sulfate-K Sulfate-Mg Sulf (SUPREP BOWEL PREP KIT) 17.5-3.13-1.6 GM/177ML SOLN Take 1 kit by mouth as directed. 08/20/19   [name], MD  orphenadrine (NORFLEX) 100 MG tablet Take 1 tablet (100 mg total) by mouth 2 (two) times daily as needed for muscle spasms. 09/29/19   Ellamarie Naeve, Dannielle Karvonen, PA-C  pantoprazole (PROTONIX) 40 MG tablet  Take 1 tablet (40 mg total) by mouth daily. 05/29/19   Glean Hess, MD  pravastatin (PRAVACHOL) 40 MG tablet TAKE 1 TABLET DAILY 03/31/19   Glean Hess, MD  tamsulosin [facility]) 0.4 MG CAPS capsule TAKE 1 CAPSULE AT BEDTIME 03/31/19   Glean Hess, MD  zolpidem (AMBIEN) 10 MG tablet Take 1 tablet (10 mg total) by mouth at bedtime. 08/27/19   Glean Hess, MD    Allergies Clavulanic acid and Lisinopril  Family History  Problem Relation Age of Onset  . Diabetes Mother   . Diabetes Father     Social History Social History   Tobacco Use  . Smoking status: Former Smoker    Packs/day: 2.00    Years: 47.00    Pack years: 94.00    Types: Cigarettes    Start date: 05/12/1968    Quit date: 04/03/2019    Years since quitting: 0.4  . Smokeless tobacco: Never Used  Substance Use Topics  . Alcohol use: No    Alcohol/week: 0.0 standard drinks  . Drug use: No    Review of Systems  Constitutional: Negative for fever. Cardiovascular: Negative for chest pain. Respiratory: Negative for shortness of breath. Genitourinary: Negative for dysuria. Musculoskeletal: Negative for back pain.  Reports right calf pain and paresthesias as above. Skin: Negative for rash. Neurological: Negative for headaches, focal weakness or numbness. ____________________________________________  PHYSICAL EXAM:  VITAL SIGNS: ED Triage Vitals  Enc Vitals Group     BP 09/29/19 1554 (!) 163/74     Pulse Rate 09/29/19 1554 97     Resp 09/29/19 1554 18     Temp 09/29/19 1554 98.1 F (36.7 C)     Temp src --      SpO2 09/29/19 1554 99 %     Weight 09/29/19 1555 180 lb (81.6 kg)     Height 09/29/19 1555 '5\' 7"'$  (1.702 m)     Head Circumference --      Peak Flow --      Pain Score 09/29/19 1555 10     Pain Loc --      Pain Edu? --      Excl. in [place]? --     Constitutional: Alert and oriented. Well appearing and in no distress. Head: Normocephalic and atraumatic. Eyes: Conjunctivae are  normal. Normal extraocular movements Cardiovascular: Normal rate, regular rhythm. Normal distal pulses and cap refill. Respiratory: Normal respiratory effort. No wheezes/rales. Mild rhonchi noted bilaterally Gastrointestinal: Soft and nontender. No distention. Musculoskeletal: Mildly tender to palp over the lumbosacral junction. Tender to the right calf region. No edema, warmth, or muscle defects. Nontender with normal range of motion in all other extremities.  Neurologic: Cranial nerves II through XII grossly intact.  Normal toe dorsiflexion foot eversion.  Normal gross sensation. Normal speech and language. No gross focal neurologic deficits are appreciated. Skin:  Skin is warm, dry and intact. No rash noted. Psychiatric: Mood and affect are normal. Patient exhibits appropriate insight and judgment. ____________________________________________   LABS (pertinent positives/negatives)  Labs  Reviewed  CBC WITH DIFFERENTIAL/PLATELET - Abnormal; Notable for the following components:      Result Value   RDW 11.1 (*)    All other components within normal limits  BASIC METABOLIC PANEL - Abnormal; Notable for the following components:   Glucose, Bld 363 (*)    BUN 29 (*)    All other components within normal limits  SEDIMENTATION RATE  C-REACTIVE PROTEIN  ____________________________________________  PROCEDURES  Toradol 30 mg IVP Norflex 60 mg IVP Hydromorphone 1 mg IVP Zofran 4 mg IVP Procedures ____________________________________________  INITIAL IMPRESSION / ASSESSMENT AND PLAN / ED COURSE  Patient with a history of degenerative disc disease and radiculitis with right lower extremity sciatic nerve irritation, presents for pain following a recent lumbar sacral ESI.  Patient is without fevers, bladder or bowel incontinence, acute back pain, or further indications of acute neuromuscular deficit.  He was evaluated for his symptoms to rule out a sterile epidural abscess.  Patient exam is  reassuring and labs also negative for any acute inflammatory or infectious markers.  His pain is been treated with IV medication administration, and is discharged at this time with prescription for ketorolac, Norflex, and instructions to begin to up the dose his gabapentin.  He will follow-up with Dr. Sharlet Salina for further management of his chronic pain.  Return precautions have been reviewed.  Patient is wife thankful for the care received, patient describes complete resolution of his pain even prior to be narcotic IV administration.  Paul Stokes was evaluated in Emergency Department on 09/29/2019 for the symptoms described in the history of present illness. He was evaluated in the context of the global COVID-19 pandemic, which necessitated consideration that the patient might be at risk for infection with the SARS-CoV-2 virus that causes COVID-19. Institutional protocols and algorithms that pertain to the evaluation of patients at risk for COVID-19 are in a state of rapid change based on information released by regulatory bodies including the CDC and federal and state organizations. These policies and algorithms were followed during the patient's care in the ED.  I reviewed the patient's prescription history over the last 12 months in the multi-state controlled substances database(s) that includes [place], [place], [place], [place], [place], [place], [place], [place], [place], [place], [place], [place], [place], and [place].  Results were notable for recent RX noted.  ____________________________________________  FINAL CLINICAL IMPRESSION(S) / ED DIAGNOSES  Final diagnoses:  Chronic right-sided low back pain with right-sided sciatica      Melvenia Needles, PA-C 09/29/19 1925    Lavonia Drafts, MD 09/29/19 1939

## 2019-09-30 ENCOUNTER — Other Ambulatory Visit: Payer: Self-pay | Admitting: Physical Medicine and Rehabilitation

## 2019-09-30 ENCOUNTER — Other Ambulatory Visit: Payer: Self-pay

## 2019-09-30 DIAGNOSIS — M5441 Lumbago with sciatica, right side: Secondary | ICD-10-CM

## 2019-10-01 ENCOUNTER — Other Ambulatory Visit: Payer: Self-pay

## 2019-10-01 ENCOUNTER — Ambulatory Visit
Admission: RE | Admit: 2019-10-01 | Discharge: 2019-10-01 | Disposition: A | Discharge status: Home / Self Care | Payer: 59 | Source: Ambulatory Visit | Attending: Physical Medicine and Rehabilitation | Admitting: Physical Medicine and Rehabilitation

## 2019-10-01 DIAGNOSIS — M5441 Lumbago with sciatica, right side: Secondary | ICD-10-CM | POA: Diagnosis present

## 2019-10-03 ENCOUNTER — Emergency Department
Admission: EM | Admit: 2019-10-03 | Discharge: 2019-10-03 | Disposition: A | Discharge status: Home / Self Care | Payer: 59 | Attending: Emergency Medicine | Admitting: Emergency Medicine

## 2019-10-03 ENCOUNTER — Encounter: Payer: Self-pay | Admitting: Emergency Medicine

## 2019-10-03 ENCOUNTER — Other Ambulatory Visit: Payer: Self-pay

## 2019-10-03 DIAGNOSIS — M5416 Radiculopathy, lumbar region: Secondary | ICD-10-CM | POA: Insufficient documentation

## 2019-10-03 DIAGNOSIS — Z794 Long term (current) use of insulin: Secondary | ICD-10-CM | POA: Insufficient documentation

## 2019-10-03 DIAGNOSIS — Z87891 Personal history of nicotine dependence: Secondary | ICD-10-CM | POA: Insufficient documentation

## 2019-10-03 DIAGNOSIS — E109 Type 1 diabetes mellitus without complications: Secondary | ICD-10-CM | POA: Diagnosis not present

## 2019-10-03 DIAGNOSIS — Z79899 Other long term (current) drug therapy: Secondary | ICD-10-CM | POA: Insufficient documentation

## 2019-10-03 DIAGNOSIS — Z7982 Long term (current) use of aspirin: Secondary | ICD-10-CM | POA: Diagnosis not present

## 2019-10-03 DIAGNOSIS — M545 Low back pain: Secondary | ICD-10-CM | POA: Diagnosis present

## 2019-10-03 MED ORDER — HYDROMORPHONE HCL 1 MG/ML IJ SOLN
1.0000 mg | Freq: Once | INTRAMUSCULAR | Status: AC
  Administered 2019-10-03: 1 mg via INTRAMUSCULAR
  Filled 2019-10-03: qty 1

## 2019-10-03 MED ORDER — OXYCODONE-ACETAMINOPHEN 7.5-325 MG PO TABS: 1.0000 | ORAL_TABLET | Freq: Four times a day (QID) | ORAL | 0 refills | Status: DC | PRN

## 2019-10-03 MED ORDER — KETOROLAC TROMETHAMINE 30 MG/ML IJ SOLN
30.0000 mg | Freq: Once | INTRAMUSCULAR | Status: AC
  Administered 2019-10-03: 30 mg via INTRAMUSCULAR
  Filled 2019-10-03: qty 1

## 2019-10-03 MED ORDER — KETOROLAC TROMETHAMINE 10 MG PO TABS
10.0000 mg | ORAL_TABLET | Freq: Four times a day (QID) | ORAL | 0 refills | Status: DC | PRN
Start: 2019-10-03 — End: 2020-12-30

## 2019-10-03 MED ORDER — ORPHENADRINE CITRATE 30 MG/ML IJ SOLN: 60.0000 mg | Freq: Two times a day (BID) | INTRAMUSCULAR | Status: DC

## 2019-10-03 MED ORDER — ORPHENADRINE CITRATE 30 MG/ML IJ SOLN
60.0000 mg | Freq: Two times a day (BID) | INTRAMUSCULAR | Status: DC
  Administered 2019-10-03: 60 mg via INTRAMUSCULAR
  Filled 2019-10-03: qty 2

## 2019-10-03 NOTE — ED Triage Notes (Signed)
Presents with left lower leg pain  States he was seen for sciatic pain in past   States his back pain has eased off   Now having increased pain and numbness to posterior left lower leg   Scheduled to see surgeon next week

## 2019-10-03 NOTE — Discharge Instructions (Addendum)
Follow discharge care instruction take medication as directed.  Follow-up with scheduled neurology appointment next week.

## 2019-10-03 NOTE — ED Provider Notes (Signed)
[facility] Emergency Department Provider Note   ____________________________________________   First MD Initiated Contact with Patient 10/03/19 1159     (approximate)  I have reviewed the triage vital signs and the nursing notes.   HISTORY  Chief Complaint Back Pain    HPI Paul Stokes is a 64 y.o. male injection patient complain of acute low back pain with radicular component to the posterior left lower extremity.  Patient denies bladder or bowel dysfunction.  Recently had MRI showing multiple disc bulge and is scheduled to see neurosurgeon next week.  Patient rates his pain as a 10/10.  No palliative measure for complaint.         Past Medical History:  Diagnosis Date  . Diabetes mellitus without complication (HCC)    insulin pump  . Emphysema of lung ([place])   . GERD (gastroesophageal reflux disease)   . Prostate disease     Patient Active Problem List   Diagnosis Date Noted  . Personal history of colonic polyps   . Aortic atherosclerosis ([place]) 08/27/2019  . Dyspepsia 08/06/2018  . Acute bilateral low back pain with right-sided sciatica 11/29/2017  . Mood disorder ([place]) 11/15/2016  . BPH with obstruction/lower urinary tract symptoms 06/15/2015  . Hyperlipidemia 06/15/2015  . Insomnia, persistent 03/06/2015  . Compulsive tobacco user syndrome 03/06/2015  . Type 1 diabetes mellitus with sensory neuropathy ([place]) 03/06/2015  . Presence of insulin pump 06/08/2014    Past Surgical History:  Procedure Laterality Date  . COLONOSCOPY  08/2013   7 tubular adenomas  . COLONOSCOPY WITH PROPOFOL N/A 09/10/2019   Procedure: COLONOSCOPY WITH PROPOFOL;  Surgeon: [name], MD;  Location: [facility] ENDOSCOPY;  Service: Endoscopy;  Laterality: N/A;  . COLONOSCOPY WITH PROPOFOL N/A 09/11/2019   Procedure: COLONOSCOPY WITH PROPOFOL;  Surgeon: Jonathon Bellows, MD;  Location: [facility] ENDOSCOPY;  Service: Gastroenterology;  Laterality: N/A;    Prior to Admission  medications   Medication Sig Start Date End Date Taking? Authorizing Provider  albuterol (VENTOLIN HFA) 108 (90 Base) MCG/ACT inhaler Inhale 2 puffs into the lungs every 6 (six) hours as needed for wheezing or shortness of breath. 07/29/19   Glean Hess, MD  aspirin 81 MG tablet Take 1 tablet by mouth daily.    [provider]  BAYER CONTOUR NEXT TEST test strip  01/01/15   [provider]  buPROPion (WELLBUTRIN SR) 150 MG 12 hr tablet TAKE 1 TABLET EVERY MORNING AND TAKE 2 TABLETS AT BEDTIME 07/25/19   Glean Hess, MD  Continuous Blood Gluc Sensor (FREESTYLE LIBRE 14 DAY SENSOR) MISC SMARTSIG:1 Kit(s) Topical Every 2 Weeks 04/10/19   [provider]  DULERA 200-5 MCG/ACT AERO TAKE 2 PUFFS INTO LUNGS DAILY 07/11/19   Glean Hess, MD  gabapentin (NEURONTIN) 300 MG capsule TAKE 2 CAPSULES AT BEDTIME 07/24/19   Glean Hess, MD  HUMALOG 100 UNIT/ML injection  03/06/19   [provider]  insulin aspart (NOVOLOG) 100 UNIT/ML injection Inject into the skin. Via insulin pump    [provider]  ketorolac (TORADOL) 10 MG tablet Take 1 tablet (10 mg total) by mouth every 8 (eight) hours. 09/29/19   Menshew, Dannielle Karvonen, PA-C  ketorolac (TORADOL) 10 MG tablet Take 1 tablet (10 mg total) by mouth every 6 (six) hours as needed. 10/03/19   Sable Feil, PA-C  Na Sulfate-K Sulfate-Mg Sulf (SUPREP BOWEL PREP KIT) 17.5-3.13-1.6 GM/177ML SOLN Take 1 kit by mouth as directed. 08/20/19  [name], MD  orphenadrine (NORFLEX) 100 MG tablet Take 1 tablet (100 mg total) by mouth 2 (two) times daily as needed for muscle spasms. 09/29/19   Menshew, Dannielle Karvonen, PA-C  oxyCODONE-acetaminophen (PERCOCET) 7.5-325 MG tablet Take 1 tablet by mouth every 6 (six) hours as needed. 10/03/19   Sable Feil, PA-C  pantoprazole (PROTONIX) 40 MG tablet Take 1 tablet (40 mg total) by mouth daily. 05/29/19   Glean Hess, MD  pravastatin (PRAVACHOL) 40 MG tablet  TAKE 1 TABLET DAILY 03/31/19   Glean Hess, MD  tamsulosin [facility]) 0.4 MG CAPS capsule TAKE 1 CAPSULE AT BEDTIME 03/31/19   Glean Hess, MD  zolpidem (AMBIEN) 10 MG tablet Take 1 tablet (10 mg total) by mouth at bedtime. 08/27/19   Glean Hess, MD    Allergies Clavulanic acid and Lisinopril  Family History  Problem Relation Age of Onset  . Diabetes Mother   . Diabetes Father     Social History Social History   Tobacco Use  . Smoking status: Former Smoker    Packs/day: 2.00    Years: 47.00    Pack years: 94.00    Types: Cigarettes    Start date: 05/12/1968    Quit date: 04/03/2019    Years since quitting: 0.5  . Smokeless tobacco: Never Used  Substance Use Topics  . Alcohol use: No    Alcohol/week: 0.0 standard drinks  . Drug use: No    Review of Systems Constitutional: No fever/chills Eyes: No visual changes. ENT: No sore throat. Cardiovascular: Denies chest pain. Respiratory: Denies shortness of breath. Gastrointestinal: No abdominal pain.  No nausea, no vomiting.  No diarrhea.  No constipation. Genitourinary: Negative for dysuria.  BPH  Musculoskeletal: Negative for back pain. Skin: Negative for rash. Neurological: Negative for headaches, focal weakness or numbness. Psychiatric:  Mood disorder Endocrine:  Diabetes and hyperlipidemia. Hematological/Lymphatic:  Allergic/Immunilogical: Clavulanic acid and lisinopril. ____________________________________________   PHYSICAL EXAM:  VITAL SIGNS: ED Triage Vitals  Enc Vitals Group     BP 10/03/19 1136 (!) 148/86     Pulse Rate 10/03/19 1136 78     Resp 10/03/19 1136 18     Temp 10/03/19 1136 98 F (36.7 C)     Temp Source 10/03/19 1136 Oral     SpO2 10/03/19 1136 98 %     Weight 10/03/19 1125 180 lb 12.4 oz (82 kg)     Height 10/03/19 1125 _0  (1.702 m)     Head Circumference --      Peak Flow --      Pain Score --      Pain Loc --      Pain Edu? --      Excl. in [place]? --      Constitutional: Alert and oriented.  Moderate distress.   Cardiovascular: Normal rate, regular rhythm. Grossly normal heart sounds.  Good peripheral circulation. Respiratory: Normal respiratory effort.  No retractions. Lungs CTAB. Gastrointestinal: Soft and nontender. No distention. No abdominal bruits. No CVA tenderness. Genitourinary: Deferred Musculoskeletal: No lower extremity tenderness nor edema.  No joint effusions. Neurologic:  Normal speech and language. No gross focal neurologic deficits are appreciated. No gait instability. Skin:  Skin is warm, dry and intact. No rash noted. Psychiatric: Mood and affect are normal. Speech and behavior are normal.  ____________________________________________   LABS (all labs ordered are listed, but only abnormal results are displayed)  Labs Reviewed - No data to display ____________________________________________  EKG  ____________________________________________  [place]  ED MD interpretation:    Official radiology report(s): No results found.  ____________________________________________   PROCEDURES  Procedure(s) performed (including Critical Care):  Procedures   ____________________________________________   INITIAL IMPRESSION / ASSESSMENT AND PLAN / ED COURSE  As part of my medical decision making, I reviewed the following data within the electronic MEDICAL RECORD NUMBER     Acute radicular back pain secondary to degenerative change in lumbar spine.  Patient given discharge care instruction advised take medication as directed pending evaluation by neurosurgeon next week.    Paul Stokes was evaluated in Emergency Department on 10/03/2019 for the symptoms described in the history of present illness. He was evaluated in the context of the global COVID-19 pandemic, which necessitated consideration that the patient might be at risk for infection with the SARS-CoV-2 virus that causes COVID-19. Institutional protocols  and algorithms that pertain to the evaluation of patients at risk for COVID-19 are in a state of rapid change based on information released by regulatory bodies including the CDC and federal and state organizations. These policies and algorithms were followed during the patient's care in the ED.       ____________________________________________   FINAL CLINICAL IMPRESSION(S) / ED DIAGNOSES  Final diagnoses:  Acute radicular low back pain     ED Discharge Orders         Ordered    oxyCODONE-acetaminophen (PERCOCET) 7.5-325 MG tablet  Every 6 hours PRN     10/03/19 1248    ketorolac (TORADOL) 10 MG tablet  Every 6 hours PRN     10/03/19 1248           Note:  This document was prepared using Dragon voice recognition software and may include unintentional dictation errors.    Sable Feil, PA-C 10/03/19 1257    Duffy Bruce, MD 10/03/19 2033

## 2019-10-31 ENCOUNTER — Telehealth: Payer: Self-pay | Admitting: Internal Medicine

## 2019-10-31 NOTE — Telephone Encounter (Signed)
[facility] with [facility] has a question concerning the dulera 220-5 mg . This medication was written back in feb. Ebony Hail needs the directions to be clarified normally its two puff twice a day instead of once a day.Marland Kitchen

## 2019-10-31 NOTE — Telephone Encounter (Signed)
Called pharmacy Dr. Ronnald Ramp gave verbal order that its can be taken 2 puff 2 times a day.   KP

## 2019-10-31 NOTE — Telephone Encounter (Signed)
Pharmacy is requesting review of dosing directions on medication: Paul Stokes

## 2019-11-11 ENCOUNTER — Other Ambulatory Visit: Payer: Self-pay

## 2019-11-11 DIAGNOSIS — J40 Bronchitis, not specified as acute or chronic: Secondary | ICD-10-CM

## 2019-11-12 LAB — HEMOGLOBIN A1C: Hemoglobin A1C: 10.4

## 2019-12-10 ENCOUNTER — Other Ambulatory Visit: Payer: Self-pay | Admitting: Internal Medicine

## 2019-12-10 ENCOUNTER — Encounter: Payer: Self-pay | Admitting: Internal Medicine

## 2019-12-10 DIAGNOSIS — J40 Bronchitis, not specified as acute or chronic: Secondary | ICD-10-CM

## 2019-12-10 MED ORDER — DULERA 200-5 MCG/ACT IN AERO: INHALATION_SPRAY | RESPIRATORY_TRACT | 0 refills | Status: DC

## 2019-12-10 MED ORDER — ALBUTEROL SULFATE HFA 108 (90 BASE) MCG/ACT IN AERS: 2.0000 | INHALATION_SPRAY | Freq: Four times a day (QID) | RESPIRATORY_TRACT | 0 refills | Status: DC | PRN

## 2019-12-10 NOTE — Telephone Encounter (Signed)
Pt has an appt on 12/13/2019 and needs refills [facility] in [place] today dulera and albuterol inhaler

## 2019-12-10 NOTE — Telephone Encounter (Signed)
Requested medication (s) are due for refill today - Albuterol is early, Ruthe Mannan has RF  Requested medication (s) are on the active medication list -yes  Future visit scheduled -yes  Last refill: Call to pharmacy: Ruthe Mannan- Feb/May- due to cost Albuterol- slightly early  Notes to clinic: Attempted to call patient to check for uncontrolled symptoms- left message. [facility] patient has not been using regularly due to cost. Patient has upcoming appointment- may need to review. May be able to fill Albuterol early-sent for review  Requested Prescriptions  Pending Prescriptions Disp Refills   mometasone-formoterol (DULERA) 200-5 MCG/ACT AERO 13 g 12    Sig: TAKE 2 PUFFS INTO LUNGS DAILY      Pulmonology:  Combination Products Passed - 12/10/2019  9:25 AM      Passed - Valid encounter within last 12 months    Recent Outpatient Visits           6 months ago Colicky RUQ abdominal pain   [facility] Glean Hess, MD   7 months ago [facility] Glean Hess, MD   1 year ago Annual physical exam   [facility] Glean Hess, MD   1 year ago Trigger middle finger of right hand   [facility] Glean Hess, MD   2 years ago Acute bilateral low back pain with right-sided sciatica   [facility] Glean Hess, MD       Future Appointments             In 3 days Glean Hess, MD [facility]              albuterol (VENTOLIN HFA) 108 (90 Base) MCG/ACT inhaler 18 g 5    Sig: Inhale 2 puffs into the lungs every 6 (six) hours as needed for wheezing or shortness of breath.      Pulmonology:  Beta Agonists Failed - 12/10/2019  9:25 AM      Failed - One inhaler should last at least one month. If the patient is requesting refills earlier, contact the patient to check for uncontrolled symptoms.      Passed - Valid encounter within last 12 months    Recent Outpatient  Visits           6 months ago Colicky RUQ abdominal pain   [facility] Glean Hess, MD   7 months ago [facility] Glean Hess, MD   1 year ago Annual physical exam   [facility] Glean Hess, MD   1 year ago Trigger middle finger of right hand   [facility] Glean Hess, MD   2 years ago Acute bilateral low back pain with right-sided sciatica   [facility] Glean Hess, MD       Future Appointments             In 3 days Glean Hess, MD [facility], [facility]                Requested Prescriptions  Pending Prescriptions Disp Refills   mometasone-formoterol (DULERA) 200-5 MCG/ACT AERO 13 g 12    Sig: TAKE 2 PUFFS INTO LUNGS DAILY      Pulmonology:  Combination Products Passed - 12/10/2019  9:25 AM      Passed - Valid encounter within last 12 months  Recent Outpatient Visits           6 months ago Colicky RUQ abdominal pain   [facility] Glean Hess, MD   7 months ago [facility] Glean Hess, MD   1 year ago Annual physical exam   [facility] Glean Hess, MD   1 year ago Trigger middle finger of right hand   [facility] Glean Hess, MD   2 years ago Acute bilateral low back pain with right-sided sciatica   [facility] Glean Hess, MD       Future Appointments             In 3 days Glean Hess, MD [facility]              albuterol (VENTOLIN HFA) 108 (90 Base) MCG/ACT inhaler 18 g 5    Sig: Inhale 2 puffs into the lungs every 6 (six) hours as needed for wheezing or shortness of breath.      Pulmonology:  Beta Agonists Failed - 12/10/2019  9:25 AM      Failed - One inhaler should last at least one month. If the patient is requesting refills earlier, contact the patient to check for uncontrolled symptoms.      Passed - Valid encounter within  last 12 months    Recent Outpatient Visits           6 months ago Colicky RUQ abdominal pain   [facility] Glean Hess, MD   7 months ago [facility] Glean Hess, MD   1 year ago Annual physical exam   [facility] Glean Hess, MD   1 year ago Trigger middle finger of right hand   [facility] Glean Hess, MD   2 years ago Acute bilateral low back pain with right-sided sciatica   [facility] Glean Hess, MD       Future Appointments             In 3 days Glean Hess, MD [facility], [facility]

## 2019-12-11 ENCOUNTER — Other Ambulatory Visit: Payer: Self-pay

## 2019-12-11 ENCOUNTER — Other Ambulatory Visit: Payer: Self-pay | Admitting: Internal Medicine

## 2019-12-11 DIAGNOSIS — J432 Centrilobular emphysema: Secondary | ICD-10-CM | POA: Insufficient documentation

## 2019-12-11 DIAGNOSIS — J40 Bronchitis, not specified as acute or chronic: Secondary | ICD-10-CM

## 2019-12-11 MED ORDER — DULERA 200-5 MCG/ACT IN AERO: INHALATION_SPRAY | RESPIRATORY_TRACT | 0 refills | Status: DC

## 2019-12-11 MED ORDER — BREO ELLIPTA 200-25 MCG/INH IN AEPB: 1.0000 | INHALATION_SPRAY | Freq: Every day | RESPIRATORY_TRACT | 5 refills | Status: DC

## 2019-12-13 ENCOUNTER — Other Ambulatory Visit: Payer: Self-pay

## 2019-12-13 ENCOUNTER — Encounter: Payer: Self-pay | Admitting: Internal Medicine

## 2019-12-13 ENCOUNTER — Ambulatory Visit (INDEPENDENT_AMBULATORY_CARE_PROVIDER_SITE_OTHER): Payer: 59 | Admitting: Internal Medicine

## 2019-12-13 VITALS — BP 126/70 | HR 86 | Temp 98.4°F | Ht 67.0 in | Wt 179.0 lb

## 2019-12-13 DIAGNOSIS — F39 Unspecified mood [affective] disorder: Secondary | ICD-10-CM | POA: Diagnosis not present

## 2019-12-13 DIAGNOSIS — E1040 Type 1 diabetes mellitus with diabetic neuropathy, unspecified: Secondary | ICD-10-CM

## 2019-12-13 DIAGNOSIS — J441 Chronic obstructive pulmonary disease with (acute) exacerbation: Secondary | ICD-10-CM | POA: Diagnosis not present

## 2019-12-13 DIAGNOSIS — I700 Atherosclerosis of aorta: Secondary | ICD-10-CM | POA: Diagnosis not present

## 2019-12-13 MED ORDER — PREDNISONE 10 MG PO TABS: 10.0000 mg | ORAL_TABLET | ORAL | 0 refills | Status: AC

## 2019-12-13 NOTE — Progress Notes (Signed)
Date:  12/13/2019   Name:  Paul Stokes   DOB:  [birth year]   MRN:  [id number]   Chief Complaint: Bronchitis (Seen UC at [facility] - prescribed levaquin, flonase, and tessalon perles for cough. Not feeling better at all.  Was seen at [facility] on 07/27.)  Cough This is a new problem. The current episode started in the past 7 days. The problem has been gradually improving. The problem occurs every few minutes. The cough is productive of sputum. Associated symptoms include shortness of breath. Pertinent negatives include no chest pain, chills, fever, headaches or wheezing. He has tried a beta-agonist inhaler and prescription cough suppressant (antibiotics) for the symptoms. His past medical history is significant for COPD.    Lab Results  Component Value Date   CREATININE 0.98 09/29/2019   BUN 29 (H) 09/29/2019   NA 135 09/29/2019   K 4.7 09/29/2019   CL 100 09/29/2019   CO2 24 09/29/2019   Lab Results  Component Value Date   CHOL 115 08/06/2018   HDL 49 08/06/2018   LDLCALC 54 08/06/2018   TRIG 60 08/06/2018   CHOLHDL 2.3 08/06/2018   Lab Results  Component Value Date   TSH 2.130 06/15/2015   Lab Results  Component Value Date   HGBA1C 10.4 11/12/2019   Lab Results  Component Value Date   WBC 9.0 09/29/2019   HGB 15.2 09/29/2019   HCT 44.4 09/29/2019   MCV 85.7 09/29/2019   PLT 392 09/29/2019   Lab Results  Component Value Date   ALT 33 08/06/2018   AST 18 08/06/2018   ALKPHOS 88 08/06/2018   BILITOT 0.4 08/06/2018     Review of Systems  Constitutional: Positive for fatigue. Negative for chills, diaphoresis, fever and unexpected weight change.  Respiratory: Positive for cough, chest tightness and shortness of breath. Negative for wheezing.   Cardiovascular: Negative for chest pain and leg swelling.  Neurological: Negative for dizziness and headaches.  Psychiatric/Behavioral: Positive for sleep disturbance. Negative for dysphoric mood.    Patient Active Problem List    Diagnosis Date Noted  . Centrilobular emphysema ([place]) 12/11/2019  . Personal history of colonic polyps   . Aortic atherosclerosis ([place]) 08/27/2019  . Dyspepsia 08/06/2018  . Acute bilateral low back pain with right-sided sciatica 11/29/2017  . Mood disorder ([place]) 11/15/2016  . BPH with obstruction/lower urinary tract symptoms 06/15/2015  . Hyperlipidemia 06/15/2015  . Insomnia, persistent 03/06/2015  . Compulsive tobacco user syndrome 03/06/2015  . Type 1 diabetes mellitus with sensory neuropathy ([place]) 03/06/2015  . Presence of insulin pump 06/08/2014    Allergies  Allergen Reactions  . Clavulanic Acid Nausea And Vomiting  . Lisinopril Cough    Past Surgical History:  Procedure Laterality Date  . COLONOSCOPY  08/2013   7 tubular adenomas  . COLONOSCOPY WITH PROPOFOL N/A 09/10/2019   Procedure: COLONOSCOPY WITH PROPOFOL;  Surgeon: [name], MD;  Location: [facility] ENDOSCOPY;  Service: Endoscopy;  Laterality: N/A;  . COLONOSCOPY WITH PROPOFOL N/A 09/11/2019   Procedure: COLONOSCOPY WITH PROPOFOL;  Surgeon: Jonathon Bellows, MD;  Location: [facility] ENDOSCOPY;  Service: Gastroenterology;  Laterality: N/A;    Social History   Tobacco Use  . Smoking status: Former Smoker    Packs/day: 2.00    Years: 47.00    Pack years: 94.00    Types: Cigarettes    Start date: 05/12/1968    Quit date: 04/03/2019    Years since quitting: 0.6  . Smokeless tobacco: Never Used  Vaping Use  . Vaping Use: Never used  Substance Use Topics  . Alcohol use: No    Alcohol/week: 0.0 standard drinks  . Drug use: No     Medication list has been reviewed and updated.  Current Meds  Medication Sig  . albuterol (VENTOLIN HFA) 108 (90 Base) MCG/ACT inhaler Inhale 2 puffs into the lungs every 6 (six) hours as needed for wheezing or shortness of breath.  Marland Kitchen aspirin 81 MG tablet Take 1 tablet by mouth daily.  Marland Kitchen BAYER CONTOUR NEXT TEST test strip   . buPROPion (WELLBUTRIN SR) 150 MG 12 hr tablet TAKE 1 TABLET  EVERY MORNING AND TAKE 2 TABLETS AT BEDTIME  . Continuous Blood Gluc Sensor (FREESTYLE LIBRE 14 DAY SENSOR) MISC SMARTSIG:1 Kit(s) Topical Every 2 Weeks  . gabapentin (NEURONTIN) 300 MG capsule TAKE 2 CAPSULES AT BEDTIME  . HUMALOG 100 UNIT/ML injection   . insulin aspart (NOVOLOG) 100 UNIT/ML injection Inject into the skin. Via insulin pump  . ketorolac (TORADOL) 10 MG tablet Take 1 tablet (10 mg total) by mouth every 8 (eight) hours.  Marland Kitchen ketorolac (TORADOL) 10 MG tablet Take 1 tablet (10 mg total) by mouth every 6 (six) hours as needed.  . mometasone-formoterol (DULERA) 200-5 MCG/ACT AERO Inhale 2 puffs into the lungs 2 (two) times daily.  . orphenadrine (NORFLEX) 100 MG tablet Take 1 tablet (100 mg total) by mouth 2 (two) times daily as needed for muscle spasms.  Marland Kitchen oxyCODONE-acetaminophen (PERCOCET) 7.5-325 MG tablet Take 1 tablet by mouth every 6 (six) hours as needed.  . pantoprazole (PROTONIX) 40 MG tablet Take 1 tablet (40 mg total) by mouth daily.  . pravastatin (PRAVACHOL) 40 MG tablet TAKE 1 TABLET DAILY  . tamsulosin (FLOMAX) 0.4 MG CAPS capsule TAKE 1 CAPSULE AT BEDTIME  . zolpidem (AMBIEN) 10 MG tablet Take 1 tablet (10 mg total) by mouth at bedtime.  . [DISCONTINUED] fluticasone furoate-vilanterol (BREO ELLIPTA) 200-25 MCG/INH AEPB Inhale 1 puff into the lungs daily.    PHQ 2/9 Scores 12/13/2019 05/29/2019 08/06/2018 06/05/2018  PHQ - 2 Score 0 0 2 0  PHQ- 9 Score 0 - 11 -    GAD 7 : Generalized Anxiety Score 11/15/2017 09/04/2017  Nervous, Anxious, on Edge 1 3  Control/stop worrying 0 3  Worry too much - different things 1 3  Trouble relaxing 1 3  Restless 0 2  Easily annoyed or irritable 3 3  Afraid - awful might happen 0 0  Total GAD 7 Score 6 17  Anxiety Difficulty Not difficult at all Extremely difficult    BP Readings from Last 3 Encounters:  12/13/19 126/70  10/03/19 (!) 148/86  09/29/19 (!) 146/78    Physical Exam Vitals and nursing note reviewed.    Constitutional:      General: He is not in acute distress.    Appearance: Normal appearance. He is well-developed.  HENT:     Head: Normocephalic and atraumatic.  Cardiovascular:     Rate and Rhythm: Normal rate and regular rhythm.  Pulmonary:     Effort: Pulmonary effort is normal. No accessory muscle usage, prolonged expiration or respiratory distress.     Breath sounds: Decreased breath sounds present. No wheezing.  Musculoskeletal:        General: Normal range of motion.     Cervical back: Normal range of motion.  Lymphadenopathy:     Cervical: No cervical adenopathy.  Skin:    General: Skin is warm and dry.  Findings: No rash.  Neurological:     Mental Status: He is alert and oriented to person, place, and time.  Psychiatric:        Behavior: Behavior normal.        Thought Content: Thought content normal.     Wt Readings from Last 3 Encounters:  12/13/19 179 lb (81.2 kg)  10/03/19 180 lb 12.4 oz (82 kg)  09/29/19 180 lb (81.6 kg)    BP 126/70   Pulse 86   Temp 98.4 F (36.9 C) (Oral)   Ht '5\' 7"'$  (1.702 m)   Wt 179 lb (81.2 kg)   SpO2 96%   BMI 28.04 kg/m   Assessment and Plan: 1. COPD with acute exacerbation ([place]) Finish antibiotics and add steroid taper Continue Ventolin PRN Use Symbicort 2 puffs bid until Rx for Dulera arrives - predniSONE (DELTASONE) 10 MG tablet; Take 1 tablet (10 mg total) by mouth as directed for 6 days. Take 6,5,4,3,2,1 then stop  Dispense: 21 tablet; Refill: 0  2. Type 1 diabetes mellitus with sensory neuropathy (HCC) Now using a topical compounded medication which is helping his leg sx   3. Mood disorder (HCC) Clinically stable on current regimen with good control of symptoms, No SI or HI. Will continue current therapy with bupropion  4. Aortic atherosclerosis ([place]) On statin and aspirin therapy   Partially dictated using Editor, commissioning. Any errors are unintentional.  Halina Maidens, MD [facility]  12/13/2019

## 2019-12-18 ENCOUNTER — Telehealth: Payer: Self-pay | Admitting: Internal Medicine

## 2019-12-18 NOTE — Telephone Encounter (Signed)
Pt request refill  mometasone-formoterol (DULERA) 200-5 MCG/ACT AERO  Pt [facility] Dr Carolin Coy [facility] and [facility] will not [facility].  I do not see she has sent. Please advise  [place], [place] Phone:  [phone]  Fax:  [phone]

## 2019-12-18 NOTE — Telephone Encounter (Signed)
Sent patient and my chart message about him needing to call his insurance company to find alternatives for dulera since his insurance does not cover dulera inhaler.   CM

## 2019-12-18 NOTE — Telephone Encounter (Unsigned)
Copied from [place] [id number]. Topic: General - Inquiry >> Dec 18, 2019  2:30 PM Alease Frame wrote: Reason for CRM: Pts wife called in to update insurance so that it will cover recent medication . Please advise  [facility] - [place], [place] - [address] [place] [address] Phone: [phone] Fax: [phone]

## 2019-12-19 ENCOUNTER — Other Ambulatory Visit: Payer: Self-pay

## 2019-12-19 MED ORDER — ADVAIR HFA 45-21 MCG/ACT IN AERO
2.0000 | INHALATION_SPRAY | Freq: Two times a day (BID) | RESPIRATORY_TRACT | 5 refills | Status: DC
Start: 2019-12-19 — End: 2020-08-06

## 2019-12-19 NOTE — Telephone Encounter (Signed)
Sent advair [facility] inhaler as requested to [facility].  CM

## 2019-12-23 ENCOUNTER — Telehealth: Payer: Self-pay

## 2019-12-23 NOTE — Telephone Encounter (Signed)
Completed PA on Advair HFA 45-21 MCG Inhaler on [email or web address].  PA was approved Start Date:11/23/2019;Coverage End Date:12/22/2022;  My Chart messaged the patient to inform him of this.  CM

## 2019-12-24 ENCOUNTER — Other Ambulatory Visit: Payer: Self-pay | Admitting: Internal Medicine

## 2019-12-24 DIAGNOSIS — J441 Chronic obstructive pulmonary disease with (acute) exacerbation: Secondary | ICD-10-CM

## 2019-12-24 DIAGNOSIS — J40 Bronchitis, not specified as acute or chronic: Secondary | ICD-10-CM

## 2019-12-24 MED ORDER — PREDNISONE 10 MG PO TABS: 10.0000 mg | ORAL_TABLET | ORAL | 0 refills | Status: AC

## 2019-12-27 ENCOUNTER — Telehealth: Payer: Self-pay

## 2019-12-27 NOTE — Telephone Encounter (Signed)
Called pt to see if he was able to get his Advair from [facility]. Pt stated that he was able to get it.  KP

## 2020-01-08 ENCOUNTER — Encounter: Payer: Self-pay | Admitting: Internal Medicine

## 2020-02-05 ENCOUNTER — Other Ambulatory Visit: Payer: Self-pay | Admitting: Internal Medicine

## 2020-02-05 DIAGNOSIS — G4700 Insomnia, unspecified: Secondary | ICD-10-CM

## 2020-02-05 NOTE — Telephone Encounter (Signed)
Requested medication (s) are due for refill today: due 02/26/20  Requested medication (s) are on the active medication list: yes   Last refill: 08/27/19  #90  1 refill  Future visit scheduled no  Notes to clinic: not delegated  Requested Prescriptions  Pending Prescriptions Disp Refills   zolpidem (AMBIEN) 10 MG tablet [Pharmacy Med Name: ZOLPIDEM TARTRATE 10 MG TAB] 30 tablet     Sig: TAKE ONE TABLET BY MOUTH AT BEDTIME      Not Delegated - Psychiatry:  Anxiolytics/Hypnotics Failed - 02/05/2020  6:19 PM      Failed - This refill cannot be delegated      Failed - Urine Drug Screen completed in last 360 days.      Passed - Valid encounter within last 6 months    Recent Outpatient Visits           1 month ago COPD with acute exacerbation [facility])   [facility] Glean Hess, MD   8 months ago Colicky RUQ abdominal pain   [facility] Glean Hess, MD   8 months ago [facility] Glean Hess, MD   1 year ago Annual physical exam   [facility] Glean Hess, MD   1 year ago Trigger middle finger of right hand   [facility] Glean Hess, MD

## 2020-02-06 NOTE — Telephone Encounter (Signed)
Please advise. Last office visit 12/13/2019.  KP

## 2020-02-14 DIAGNOSIS — E1040 Type 1 diabetes mellitus with diabetic neuropathy, unspecified: Secondary | ICD-10-CM | POA: Diagnosis not present

## 2020-02-14 DIAGNOSIS — E1059 Type 1 diabetes mellitus with other circulatory complications: Secondary | ICD-10-CM | POA: Diagnosis not present

## 2020-02-14 DIAGNOSIS — E10649 Type 1 diabetes mellitus with hypoglycemia without coma: Secondary | ICD-10-CM | POA: Diagnosis not present

## 2020-02-21 DIAGNOSIS — E10649 Type 1 diabetes mellitus with hypoglycemia without coma: Secondary | ICD-10-CM | POA: Diagnosis not present

## 2020-02-21 DIAGNOSIS — E1040 Type 1 diabetes mellitus with diabetic neuropathy, unspecified: Secondary | ICD-10-CM | POA: Diagnosis not present

## 2020-02-21 DIAGNOSIS — E1065 Type 1 diabetes mellitus with hyperglycemia: Secondary | ICD-10-CM | POA: Diagnosis not present

## 2020-02-21 DIAGNOSIS — E1059 Type 1 diabetes mellitus with other circulatory complications: Secondary | ICD-10-CM | POA: Diagnosis not present

## 2020-02-21 DIAGNOSIS — Z9641 Presence of insulin pump (external) (internal): Secondary | ICD-10-CM | POA: Diagnosis not present

## 2020-03-06 DIAGNOSIS — E1065 Type 1 diabetes mellitus with hyperglycemia: Secondary | ICD-10-CM | POA: Diagnosis not present

## 2020-03-06 DIAGNOSIS — Z9641 Presence of insulin pump (external) (internal): Secondary | ICD-10-CM | POA: Diagnosis not present

## 2020-03-08 IMAGING — NM NM HEPATO W/GB/PHARM/[PERSON_NAME]
2 series · 12 of 12 positions shown · non-contrast
Comparison: 09/08/2011

CLINICAL DATA: Episodic RIGHT upper quadrant pain for years,
diabetes mellitus

EXAM:
NUCLEAR MEDICINE HEPATOBILIARY IMAGING WITH GALLBLADDER EF
TECHNIQUE: Sequential images of the abdomen were obtained [DATE] minutes
following intravenous administration of radiopharmaceutical. After
oral ingestion of Ensure, gallbladder ejection fraction was
determined. At 60 min, normal ejection fraction is greater than 33%.
RADIOPHARMACEUTICALS:  5.64 mCi 2c-22m  Choletec IV

[Series 1000: hepatobiliary scan · 9.59mm/px · 6 of 60 frames shown]
[frame 6/60]
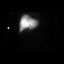
[frame 16/60]
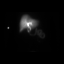
[frame 26/60]
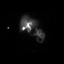
[frame 36/60]
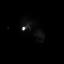
[frame 46/60]
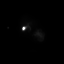
[frame 56/60]
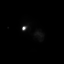

[Series 1000: gallbladder ef · 4.80mm/px · 6 of 120 frames shown]
[frame 11/120]
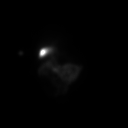
[frame 31/120]
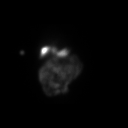
[frame 51/120]
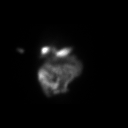
[frame 71/120]
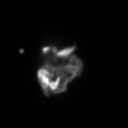
[frame 91/120]
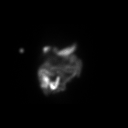
[frame 111/120]
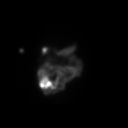

[12 of 12 positions shown; findings below may reference images not displayed]

FINDINGS: Normal tracer extraction from bloodstream indicating normal
hepatocellular function.

Normal excretion of tracer into biliary tree.

Gallbladder visualized at 24 min.

Small bowel visualized at 8 min.

No hepatic retention of tracer.

Subjectively normal emptying of tracer from gallbladder following
fatty meal stimulation.

Calculated gallbladder ejection fraction is 89%, normal.

Patient reported no symptoms following Ensure ingestion.

Normal gallbladder ejection fraction following Ensure ingestion is
greater than 33% at 1 hour.
IMPRESSION: Normal exam.

## 2020-03-18 DIAGNOSIS — E1165 Type 2 diabetes mellitus with hyperglycemia: Secondary | ICD-10-CM | POA: Diagnosis not present

## 2020-03-23 ENCOUNTER — Other Ambulatory Visit: Payer: Self-pay | Admitting: Internal Medicine

## 2020-03-23 DIAGNOSIS — N138 Other obstructive and reflux uropathy: Secondary | ICD-10-CM

## 2020-04-17 DIAGNOSIS — E1165 Type 2 diabetes mellitus with hyperglycemia: Secondary | ICD-10-CM | POA: Diagnosis not present

## 2020-05-13 ENCOUNTER — Other Ambulatory Visit: Payer: Self-pay

## 2020-05-18 DIAGNOSIS — E1165 Type 2 diabetes mellitus with hyperglycemia: Secondary | ICD-10-CM | POA: Diagnosis not present

## 2020-05-21 ENCOUNTER — Telehealth: Payer: Self-pay | Admitting: *Deleted

## 2020-05-21 NOTE — Telephone Encounter (Signed)
Attempted to contact and schedule lung screening scan. Message left for patient to call back to schedule. 

## 2020-06-03 ENCOUNTER — Other Ambulatory Visit: Payer: Self-pay | Admitting: Internal Medicine

## 2020-06-03 DIAGNOSIS — N401 Enlarged prostate with lower urinary tract symptoms: Secondary | ICD-10-CM

## 2020-06-03 DIAGNOSIS — N138 Other obstructive and reflux uropathy: Secondary | ICD-10-CM

## 2020-06-03 NOTE — Telephone Encounter (Signed)
   Requested medications are on the active medication list yes  Last visit I do not see visit that addresses BPH  Notes to clinic Please assess

## 2020-06-03 NOTE — Telephone Encounter (Signed)
Medication Refill - Medication: Flomax   Has the patient contacted their pharmacy? Yes.   Pt states that the pharmacy states that they contacted office a week ago. Pt states that he took his last pill last night. Please advise.  (Agent: If no, request that the patient contact the pharmacy for the refill.) (Agent: If yes, when and what did the pharmacy advise?)  Preferred Pharmacy (with phone number or street name):  [place], [place] - [address]  [place] [address]  Phone: [phone] Fax: [phone]  Hours: Not open 24 hours     Agent: Please be advised that RX refills may take up to 3 business days. We ask that you follow-up with your pharmacy.

## 2020-06-04 MED ORDER — TAMSULOSIN HCL 0.4 MG PO CAPS: 0.4000 mg | ORAL_CAPSULE | Freq: Every day | ORAL | 0 refills | Status: DC

## 2020-06-08 DIAGNOSIS — E1065 Type 1 diabetes mellitus with hyperglycemia: Secondary | ICD-10-CM | POA: Diagnosis not present

## 2020-06-08 DIAGNOSIS — E1040 Type 1 diabetes mellitus with diabetic neuropathy, unspecified: Secondary | ICD-10-CM | POA: Diagnosis not present

## 2020-06-08 DIAGNOSIS — Z794 Long term (current) use of insulin: Secondary | ICD-10-CM | POA: Diagnosis not present

## 2020-06-08 DIAGNOSIS — E109 Type 1 diabetes mellitus without complications: Secondary | ICD-10-CM | POA: Diagnosis not present

## 2020-06-10 ENCOUNTER — Other Ambulatory Visit: Payer: Self-pay

## 2020-06-10 DIAGNOSIS — K824 Cholesterolosis of gallbladder: Secondary | ICD-10-CM

## 2020-06-11 ENCOUNTER — Telehealth: Payer: Self-pay

## 2020-06-11 NOTE — Telephone Encounter (Signed)
Attempted to contact patient for scheduling lung screening scan. Left message for patient to call [phone] to schedule CT scan.  

## 2020-06-18 DIAGNOSIS — E1165 Type 2 diabetes mellitus with hyperglycemia: Secondary | ICD-10-CM | POA: Diagnosis not present

## 2020-06-19 ENCOUNTER — Other Ambulatory Visit: Payer: Self-pay | Admitting: *Deleted

## 2020-06-19 DIAGNOSIS — Z87891 Personal history of nicotine dependence: Secondary | ICD-10-CM

## 2020-06-19 DIAGNOSIS — Z122 Encounter for screening for malignant neoplasm of respiratory organs: Secondary | ICD-10-CM

## 2020-06-19 NOTE — Progress Notes (Signed)
Contacted and scheduled for annual lung screening scan. Patient is a former smoker, quit 04/03/19, 94 pack year history. Reports new insurance  [place] [id number] and Medicare a/b [id number].

## 2020-06-26 ENCOUNTER — Other Ambulatory Visit: Payer: Self-pay

## 2020-06-26 ENCOUNTER — Ambulatory Visit
Admission: RE | Admit: 2020-06-26 | Discharge: 2020-06-26 | Disposition: A | Discharge status: Home / Self Care | Payer: Medicare Other | Source: Ambulatory Visit | Attending: Nurse Practitioner | Admitting: Nurse Practitioner

## 2020-06-26 DIAGNOSIS — Z122 Encounter for screening for malignant neoplasm of respiratory organs: Secondary | ICD-10-CM | POA: Diagnosis not present

## 2020-06-26 DIAGNOSIS — Z87891 Personal history of nicotine dependence: Secondary | ICD-10-CM | POA: Diagnosis not present

## 2020-06-29 ENCOUNTER — Telehealth: Payer: Self-pay | Admitting: *Deleted

## 2020-06-29 ENCOUNTER — Ambulatory Visit: Payer: Medicare Other | Attending: Surgery

## 2020-06-29 NOTE — Telephone Encounter (Signed)
[facility] called to let us know that Paul Stokes did not show for his ultrasound appointment today 06/29/20, but it also looks like he canceled his 1 year f/u with [place] because he no longer needs.

## 2020-06-30 ENCOUNTER — Encounter: Payer: Self-pay | Admitting: *Deleted

## 2020-07-02 ENCOUNTER — Ambulatory Visit: Payer: Self-pay | Admitting: Surgery

## 2020-07-16 DIAGNOSIS — E1165 Type 2 diabetes mellitus with hyperglycemia: Secondary | ICD-10-CM | POA: Diagnosis not present

## 2020-07-28 DIAGNOSIS — Z4681 Encounter for fitting and adjustment of insulin pump: Secondary | ICD-10-CM | POA: Diagnosis not present

## 2020-07-28 DIAGNOSIS — E1059 Type 1 diabetes mellitus with other circulatory complications: Secondary | ICD-10-CM | POA: Diagnosis not present

## 2020-07-28 DIAGNOSIS — E1040 Type 1 diabetes mellitus with diabetic neuropathy, unspecified: Secondary | ICD-10-CM | POA: Diagnosis not present

## 2020-07-28 DIAGNOSIS — E1065 Type 1 diabetes mellitus with hyperglycemia: Secondary | ICD-10-CM | POA: Diagnosis not present

## 2020-07-28 DIAGNOSIS — E782 Mixed hyperlipidemia: Secondary | ICD-10-CM | POA: Diagnosis not present

## 2020-07-28 DIAGNOSIS — E10649 Type 1 diabetes mellitus with hypoglycemia without coma: Secondary | ICD-10-CM | POA: Diagnosis not present

## 2020-07-28 LAB — MICROALBUMIN, URINE: Microalb, Ur: 7

## 2020-07-28 LAB — HEMOGLOBIN A1C: Hemoglobin A1C: 8.3

## 2020-07-28 LAB — MICROALBUMIN / CREATININE URINE RATIO: Microalb Creat Ratio: <5.6

## 2020-07-30 ENCOUNTER — Other Ambulatory Visit: Payer: Self-pay | Admitting: Internal Medicine

## 2020-07-30 ENCOUNTER — Encounter: Payer: Self-pay | Admitting: Internal Medicine

## 2020-07-30 DIAGNOSIS — G4700 Insomnia, unspecified: Secondary | ICD-10-CM

## 2020-07-30 NOTE — Telephone Encounter (Signed)
Requested medication (s) are due for refill today:   Yes  Requested medication (s) are on the active medication list:   Yes  Future visit scheduled:   No   Last ordered: 02/06/2020 #30, 5 refills  Clinic note:  Non delegated refill.   Also needs an appt.  PEC not schedule for [facility].   Requested Prescriptions  Pending Prescriptions Disp Refills   zolpidem (AMBIEN) 10 MG tablet [Pharmacy Med Name: ZOLPIDEM TARTRATE 10MG  TAB] 30 tablet     Sig: TAKE 1 TABLET BY MOUTH AT BEDTIME      Not Delegated - Psychiatry:  Anxiolytics/Hypnotics Failed - 07/30/2020  9:35 AM      Failed - This refill cannot be delegated      Failed - Urine Drug Screen completed in last 360 days      Failed - Valid encounter within last 6 months    Recent Outpatient Visits           7 months ago COPD with acute exacerbation [facility])   [facility] Glean Hess, MD   1 year ago Colicky RUQ abdominal pain   [facility] Glean Hess, MD   1 year ago [facility] Glean Hess, MD   1 year ago Annual physical exam   [facility] Glean Hess, MD   2 years ago Trigger middle finger of right hand   [facility] Glean Hess, MD

## 2020-07-30 NOTE — Telephone Encounter (Signed)
Please review. Last office visit 12/13/2019.  KP

## 2020-08-03 ENCOUNTER — Ambulatory Visit: Payer: Medicare Other | Admitting: Internal Medicine

## 2020-08-03 NOTE — Progress Notes (Deleted)
Date:  08/03/2020   Name:  Paul Stokes   DOB:  1955/09/03   MRN:  [id number]   Chief Complaint: No chief complaint on file.  Hyperlipidemia This is a chronic problem. The problem is controlled. Current antihyperlipidemic treatment includes statins. The current treatment provides significant improvement of lipids.  Insomnia The problem occurs nightly. Past treatments include medication. The treatment provided significant relief. PMH includes: depression.  Depression        This is a chronic problem.  Associated symptoms include insomnia. Diabetes Diabetes visit type: followed by Endo. He has type 1 diabetes mellitus. His disease course has been stable. He is compliant with treatment all of the time.    Lab Results  Component Value Date   CREATININE 0.98 09/29/2019   BUN 29 (H) 09/29/2019   NA 135 09/29/2019   K 4.7 09/29/2019   CL 100 09/29/2019   CO2 24 09/29/2019   Lab Results  Component Value Date   CHOL 115 08/06/2018   HDL 49 08/06/2018   LDLCALC 54 08/06/2018   TRIG 60 08/06/2018   CHOLHDL 2.3 08/06/2018   Lab Results  Component Value Date   TSH 2.130 06/15/2015   Lab Results  Component Value Date   HGBA1C 8.3 07/28/2020   Lab Results  Component Value Date   WBC 9.0 09/29/2019   HGB 15.2 09/29/2019   HCT 44.4 09/29/2019   MCV 85.7 09/29/2019   PLT 392 09/29/2019   Lab Results  Component Value Date   ALT 33 08/06/2018   AST 18 08/06/2018   ALKPHOS 88 08/06/2018   BILITOT 0.4 08/06/2018     Review of Systems  Psychiatric/Behavioral: Positive for depression. The patient has insomnia.     Patient Active Problem List   Diagnosis Date Noted  . Centrilobular emphysema ([place]) 12/11/2019  . Personal history of colonic polyps   . Aortic atherosclerosis ([place]) 08/27/2019  . Dyspepsia 08/06/2018  . Acute bilateral low back pain with right-sided sciatica 11/29/2017  . Mood disorder ([place]) 11/15/2016  . BPH with obstruction/lower urinary tract symptoms  06/15/2015  . Hyperlipidemia 06/15/2015  . Insomnia, persistent 03/06/2015  . Compulsive tobacco user syndrome 03/06/2015  . Type 1 diabetes mellitus with sensory neuropathy ([place]) 03/06/2015  . Presence of insulin pump 06/08/2014    Allergies  Allergen Reactions  . Clavulanic Acid Nausea And Vomiting  . Lisinopril Cough    Past Surgical History:  Procedure Laterality Date  . COLONOSCOPY  08/2013   7 tubular adenomas  . COLONOSCOPY WITH PROPOFOL N/A 09/10/2019   Procedure: COLONOSCOPY WITH PROPOFOL;  Surgeon: [name], MD;  [facility] END[facility]COPY;  Service: Endoscopy;  Laterality: N/A;  . COLONOSCOPY WITH PROPOFOL N/A 09/11/2019   Procedure: COLONOSCOPY WITH PROPOFOL;  Surgeon: Jonathon Bellows, MD;  Location: [facility] ENDOSCOPY;  Service: Gastroenterology;  Laterality: N/A;    Social History   Tobacco Use  . Smoking status: Former Smoker    Packs/day: 2.00    Years: 47.00    Pack years: 94.00    Types: Cigarettes    Start date: 05/12/1968    Quit date: 04/03/2019    Years since quitting: 1.3  . Smokeless tobacco: Never Used  Vaping Use  . Vaping Use: Never used  Substance Use Topics  . Alcohol use: No    Alcohol/week: 0.0 standard drinks  . Drug use: No     Medication list has been reviewed and updated.  No outpatient medications have been marked as taking for  the 08/03/20 encounter (Appointment) with Glean Hess, MD.    [facility] 2/9 Scores 12/13/2019 05/29/2019 08/06/2018 06/05/2018  PHQ - 2 Score 0 0 2 0  PHQ- 9 Score 0 - 11 -    GAD 7 : Generalized Anxiety Score 11/15/2017 09/04/2017  Nervous, Anxious, on Edge 1 3  Control/stop worrying 0 3  Worry too much - different things 1 3  Trouble relaxing 1 3  Restless 0 2  Easily annoyed or irritable 3 3  Afraid - awful might happen 0 0  Total GAD 7 Score 6 17  Anxiety Difficulty Not difficult at all Extremely difficult    BP Readings from Last 3 Encounters:  12/13/19 126/70  10/03/19 (!) 148/86  09/29/19 (!)  146/78    Physical Exam  Wt Readings from Last 3 Encounters:  06/26/20 187 lb (84.8 kg)  12/13/19 179 lb (81.2 kg)  10/03/19 180 lb 12.4 oz (82 kg)    There were no vitals taken for this visit.  Assessment and Plan:

## 2020-08-05 ENCOUNTER — Ambulatory Visit: Payer: Medicare Other | Admitting: Internal Medicine

## 2020-08-06 ENCOUNTER — Other Ambulatory Visit: Payer: Self-pay

## 2020-08-06 ENCOUNTER — Ambulatory Visit (INDEPENDENT_AMBULATORY_CARE_PROVIDER_SITE_OTHER): Payer: Medicare Other | Admitting: Internal Medicine

## 2020-08-06 ENCOUNTER — Encounter: Payer: Self-pay | Admitting: Internal Medicine

## 2020-08-06 VITALS — BP 122/68 | HR 85 | Temp 97.7°F | Ht 67.0 in | Wt 181.0 lb

## 2020-08-06 DIAGNOSIS — E1040 Type 1 diabetes mellitus with diabetic neuropathy, unspecified: Secondary | ICD-10-CM

## 2020-08-06 DIAGNOSIS — J432 Centrilobular emphysema: Secondary | ICD-10-CM

## 2020-08-06 DIAGNOSIS — I700 Atherosclerosis of aorta: Secondary | ICD-10-CM

## 2020-08-06 DIAGNOSIS — F39 Unspecified mood [affective] disorder: Secondary | ICD-10-CM

## 2020-08-06 MED ORDER — GABAPENTIN 300 MG PO CAPS: 600.0000 mg | ORAL_CAPSULE | Freq: Three times a day (TID) | ORAL | 3 refills | Status: DC

## 2020-08-06 MED ORDER — ADVAIR HFA 45-21 MCG/ACT IN AERO: 2.0000 | INHALATION_SPRAY | Freq: Two times a day (BID) | RESPIRATORY_TRACT | 5 refills | Status: DC

## 2020-08-06 MED ORDER — PRAVASTATIN SODIUM 40 MG PO TABS: 40.0000 mg | ORAL_TABLET | Freq: Every day | ORAL | 1 refills | Status: DC

## 2020-08-06 MED ORDER — BUPROPION HCL ER (SR) 150 MG PO TB12: ORAL_TABLET | ORAL | 1 refills | Status: DC

## 2020-08-06 NOTE — Progress Notes (Signed)
Date:  08/06/2020   Name:  Paul Stokes   DOB:  12-10-1955   MRN:  [id number]   Chief Complaint: COPD, Hyperlipidemia, and Insomnia  COPD He complains of chest tightness, cough, difficulty breathing and shortness of breath. There is no wheezing. This is a recurrent problem. The problem occurs rarely. The problem has been unchanged. Pertinent negatives include no chest pain, headaches, myalgias or trouble swallowing. His symptoms are alleviated by beta-agonist and steroid inhaler. He reports significant improvement on treatment. His past medical history is significant for COPD.  Hyperlipidemia This is a chronic problem. Associated symptoms include shortness of breath. Pertinent negatives include no chest pain or myalgias. Current antihyperlipidemic treatment includes statins.  Insomnia Primary symptoms: sleep disturbance.  The problem occurs nightly. The problem is unchanged. The symptoms are relieved by medication. Treatments tried: [place]. The treatment provided significant relief.    Lab Results  Component Value Date   CREATININE 0.98 09/29/2019   BUN 29 (H) 09/29/2019   NA 135 09/29/2019   K 4.7 09/29/2019   CL 100 09/29/2019   CO2 24 09/29/2019   Lab Results  Component Value Date   CHOL 115 08/06/2018   HDL 49 08/06/2018   LDLCALC 54 08/06/2018   TRIG 60 08/06/2018   CHOLHDL 2.3 08/06/2018   Lab Results  Component Value Date   TSH 2.130 06/15/2015   Lab Results  Component Value Date   HGBA1C 8.3 07/28/2020   Lab Results  Component Value Date   WBC 9.0 09/29/2019   HGB 15.2 09/29/2019   HCT 44.4 09/29/2019   MCV 85.7 09/29/2019   PLT 392 09/29/2019   Lab Results  Component Value Date   ALT 33 08/06/2018   AST 18 08/06/2018   ALKPHOS 88 08/06/2018   BILITOT 0.4 08/06/2018     Review of Systems  Constitutional: Negative for fatigue and unexpected weight change.  HENT: Negative for nosebleeds and trouble swallowing.   Eyes: Negative for visual  disturbance.  Respiratory: Positive for cough and shortness of breath. Negative for chest tightness and wheezing.   Cardiovascular: Negative for chest pain, palpitations and leg swelling.  Gastrointestinal: Negative for abdominal pain, constipation and diarrhea.  Musculoskeletal: Negative for arthralgias, joint swelling and myalgias.  Neurological: Positive for numbness (and neuropathic pain in feet/ankles). Negative for dizziness, weakness, light-headedness and headaches.  Psychiatric/Behavioral: Positive for sleep disturbance. Negative for dysphoric mood. The patient has insomnia. The patient is not nervous/anxious.     Patient Active Problem List   Diagnosis Date Noted  . Centrilobular emphysema ([place]) 12/11/2019  . Personal history of colonic polyps   . Aortic atherosclerosis ([place]) 08/27/2019  . Dyspepsia 08/06/2018  . Acute bilateral low back pain with right-sided sciatica 11/29/2017  . Mood disorder ([place]) 11/15/2016  . BPH with obstruction/lower urinary tract symptoms 06/15/2015  . Hyperlipidemia due to type 1 diabetes mellitus ([place]) 06/15/2015  . Insomnia, persistent 03/06/2015  . Compulsive tobacco user syndrome 03/06/2015  . Type 1 diabetes mellitus with sensory neuropathy ([place]) 03/06/2015  . Presence of insulin pump 06/08/2014    Allergies  Allergen Reactions  . Clavulanic Acid Nausea And Vomiting  . Lisinopril Cough    Past Surgical History:  Procedure Laterality Date  . COLONOSCOPY  08/2013   7 tubular adenomas  . COLONOSCOPY WITH PROPOFOL N/A 09/10/2019   Procedure: COLONOSCOPY WITH PROPOFOL;  Surgeon: [name], MD;  Location: [facility] ENDOSCOPY;  Service: Endoscopy;  Laterality: N/A;  . COLONOSCOPY WITH PROPOFOL N/A 09/11/2019  Procedure: COLONOSCOPY WITH PROPOFOL;  Surgeon: Jonathon Bellows, MD;  Location: [facility] ENDOSCOPY;  Service: Gastroenterology;  Laterality: N/A;    Social History   Tobacco Use  . Smoking status: Current Every Day Smoker    Packs/day: 2.00     Years: 47.00    Pack years: 94.00    Types: Cigarettes    Start date: 05/12/1968    Last attempt to quit: 04/03/2019    Years since quitting: 1.3  . Smokeless tobacco: Never Used  Vaping Use  . Vaping Use: Never used  Substance Use Topics  . Alcohol use: No    Alcohol/week: 0.0 standard drinks  . Drug use: No     Medication list has been reviewed and updated.  Current Meds  Medication Sig  . albuterol (VENTOLIN HFA) 108 (90 Base) MCG/ACT inhaler Inhale 2 puffs into the lungs every 6 (six) hours as needed for wheezing or shortness of breath.  Marland Kitchen aspirin 81 MG tablet Take 1 tablet by mouth daily.  . B Complex Vitamins (VITAMIN B COMPLEX PO) Take by mouth daily.  Marland Kitchen BAYER CONTOUR NEXT TEST test strip   . buPROPion (WELLBUTRIN SR) 150 MG 12 hr tablet TAKE 1 TABLET EVERY MORNING AND TAKE 2 TABLETS AT BEDTIME  . Continuous Blood Gluc Sensor (FREESTYLE LIBRE 14 DAY SENSOR) MISC SMARTSIG:1 Kit(s) Topical Every 2 Weeks  . fluticasone-salmeterol (ADVAIR HFA) 45-21 MCG/ACT inhaler Inhale 2 puffs into the lungs 2 (two) times daily.  Marland Kitchen gabapentin (NEURONTIN) 300 MG capsule TAKE 2 CAPSULES AT BEDTIME  . HUMALOG 100 UNIT/ML injection   . ketorolac (TORADOL) 10 MG tablet Take 1 tablet (10 mg total) by mouth every 6 (six) hours as needed.  . pantoprazole (PROTONIX) 40 MG tablet Take 1 tablet (40 mg total) by mouth daily.  . pravastatin (PRAVACHOL) 40 MG tablet TAKE 1 TABLET DAILY  . tamsulosin (FLOMAX) 0.4 MG CAPS capsule Take 1 capsule (0.4 mg total) by mouth at bedtime.  Marland Kitchen VITAMIN D PO Take by mouth daily.  Marland Kitchen zolpidem (AMBIEN) 10 MG tablet TAKE 1 TABLET BY MOUTH AT BEDTIME    PHQ 2/9 Scores 08/06/2020 12/13/2019 05/29/2019 08/06/2018  PHQ - 2 Score 0 0 0 2  PHQ- 9 Score 0 0 - 11    GAD 7 : Generalized Anxiety Score 08/06/2020 11/15/2017 09/04/2017  Nervous, Anxious, on Edge 0 1 3  Control/stop worrying 0 0 3  Worry too much - different things 0 1 3  Trouble relaxing 0 1 3  Restless 0 0 2   Easily annoyed or irritable 0 3 3  Afraid - awful might happen 0 0 0  Total GAD 7 Score 0 6 17  Anxiety Difficulty - Not difficult at all Extremely difficult    BP Readings from Last 3 Encounters:  08/06/20 122/68  12/13/19 126/70  10/03/19 (!) 148/86    Physical Exam Vitals and nursing note reviewed.  Constitutional:      General: He is not in acute distress.    Appearance: He is well-developed.  HENT:     Head: Normocephalic and atraumatic.  Cardiovascular:     Rate and Rhythm: Normal rate and regular rhythm.     Pulses: Normal pulses.  Pulmonary:     Effort: Pulmonary effort is normal. No accessory muscle usage, prolonged expiration or respiratory distress.     Breath sounds: Decreased breath sounds present. No wheezing or rhonchi.  Musculoskeletal:     Cervical back: Normal range of motion.  Lymphadenopathy:  Cervical: No cervical adenopathy.  Skin:    General: Skin is warm and dry.     Findings: No rash.  Neurological:     Mental Status: He is alert and oriented to person, place, and time.  Psychiatric:        Mood and Affect: Mood normal.        Behavior: Behavior normal.     Wt Readings from Last 3 Encounters:  08/06/20 181 lb (82.1 kg)  06/26/20 187 lb (84.8 kg)  12/13/19 179 lb (81.2 kg)    BP 122/68   Pulse 85   Temp 97.7 F (36.5 C) (Oral)   Ht $R'5\' 7"'BW$  (1.702 m)   Wt 181 lb (82.1 kg)   SpO2 95%   BMI 28.35 kg/m   Assessment and Plan: 1. Mood disorder ([place]) Doing well - rarely smoking as well Taking ambien for sleep without evidence of misuse or side effects - buPROPion (WELLBUTRIN SR) 150 MG 12 hr tablet; TAKE 1 TABLET EVERY MORNING AND TAKE 2 TABLETS AT BEDTIME  Dispense: 270 tablet; Refill: 1 - TSH  2. Centrilobular emphysema (H[facility]) Symptoms well controlled on Advair daily Pneumonia vaccine completed - fluticasone-salmeterol (ADVAIR HFA) 45-21 MCG/ACT inhaler; Inhale 2 puffs into the lungs 2 (two) times daily.  Dispense: 1 each; Refill:  5  3. Aortic atherosclerosis ([place]) Tolerating statin medication without side effects at this time LDL is at goal of < 70 on current dose Continue same therapy without change at this time. - Lipid panel - Comprehensive metabolic panel - pravastatin (PRAVACHOL) 40 MG tablet; Take 1 tablet (40 mg total) by mouth daily.  Dispense: 90 tablet; Refill: 1  4. Type 1 diabetes mellitus with sensory neuropathy (HCC) Doing well by report on insulin pump Followed closely by Endocrinology- A1C recently 8.3 - gabapentin (NEURONTIN) 300 MG capsule; Take 2 capsules (600 mg total) by mouth 3 (three) times daily.  Dispense: 540 capsule; Refill: 3    Partially dictated using Editor, commissioning. Any errors are unintentional.  Halina Maidens, MD [facility]  08/06/2020

## 2020-08-07 DIAGNOSIS — E109 Type 1 diabetes mellitus without complications: Secondary | ICD-10-CM | POA: Diagnosis not present

## 2020-08-07 DIAGNOSIS — Z794 Long term (current) use of insulin: Secondary | ICD-10-CM | POA: Diagnosis not present

## 2020-08-07 DIAGNOSIS — E1040 Type 1 diabetes mellitus with diabetic neuropathy, unspecified: Secondary | ICD-10-CM | POA: Diagnosis not present

## 2020-08-07 DIAGNOSIS — E1065 Type 1 diabetes mellitus with hyperglycemia: Secondary | ICD-10-CM | POA: Diagnosis not present

## 2020-08-07 LAB — COMPREHENSIVE METABOLIC PANEL
ALT: 16 IU/L (ref 0–44)
AST: 19 IU/L (ref 0–40)
Albumin/Globulin Ratio: 1.8 (ref 1.2–2.2)
Albumin: 4.4 g/dL (ref 3.8–4.8)
Alkaline Phosphatase: 106 IU/L (ref 44–121)
BUN/Creatinine Ratio: 12 (ref 10–24)
BUN: 13 mg/dL (ref 8–27)
Bilirubin Total: 0.4 mg/dL (ref 0.0–1.2)
CO2: 20 mmol/L (ref 20–29)
Calcium: 9.5 mg/dL (ref 8.6–10.2)
Chloride: 100 mmol/L (ref 96–106)
Creatinine, Ser: 1.07 mg/dL (ref 0.76–1.27)
Globulin, Total: 2.4 g/dL (ref 1.5–4.5)
Glucose: 223 mg/dL — ABNORMAL HIGH (ref 65–99)
Potassium: 4.6 mmol/L (ref 3.5–5.2)
Sodium: 138 mmol/L (ref 134–144)
Total Protein: 6.8 g/dL (ref 6.0–8.5)
eGFR: 77 mL/min/{1.73_m2} (ref >59)

## 2020-08-07 LAB — LIPID PANEL
Chol/HDL Ratio: 2.8 ratio (ref 0.0–5.0)
Cholesterol, Total: 121 mg/dL (ref 100–199)
HDL: 44 mg/dL (ref >39)
LDL Chol Calc (NIH): 59 mg/dL (ref 0–99)
Triglycerides: 97 mg/dL (ref 0–149)
VLDL Cholesterol Cal: 18 mg/dL (ref 5–40)

## 2020-08-07 LAB — TSH: TSH: 1.51 u[IU]/mL (ref 0.450–4.500)

## 2020-08-12 ENCOUNTER — Ambulatory Visit: Payer: Medicare Other | Admitting: Internal Medicine

## 2020-08-12 ENCOUNTER — Ambulatory Visit: Payer: Self-pay | Admitting: *Deleted

## 2020-08-12 NOTE — Telephone Encounter (Signed)
Pt scheduled for this afternoon 08/12/20

## 2020-08-12 NOTE — Telephone Encounter (Signed)
Patient is calling to report he thinks he has sinus infection- sinus drainage, loss of voice, fatigue. Patient was in office last Thursday and reports symptoms started after that visit. Patient does have COPD and reports his symptoms are effecting his breathing off/on- but his inhalers are helping that. Patient is requesting treatment for his symptoms and does not want to come to the office if he does not have to. Patient reports no fever at this time- he does states he believes he had fever the first day- but did not take his temperature.  Reason for Disposition . [1] Longstanding difficulty breathing (e.g., CHF, COPD, emphysema) AND [2] WORSE than normal  Answer Assessment - Initial Assessment Questions 1. RESPIRATORY STATUS: "Describe your breathing?" (e.g., wheezing, shortness of breath, unable to speak, severe coughing)      SOB- off/on- larengitis type symptoms, fatigue 2. ONSET: "When did this breathing problem begin?"      COPD history, sinus head cold 3. PATTERN "Does the difficult breathing come and go, or has it been constant since it started?"      Comes and goes 4. SEVERITY: "How bad is your breathing?" (e.g., mild, moderate, severe)    - MILD: No SOB at rest, mild SOB with walking, speaks normally in sentences, can lay down, no retractions, pulse < 100.    - MODERATE: SOB at rest, SOB with minimal exertion and prefers to sit, cannot lie down flat, speaks in phrases, mild retractions, audible wheezing, pulse 100-120.    - SEVERE: Very SOB at rest, speaks in single words, struggling to breathe, sitting hunched forward, retractions, pulse > 120      mild 5. RECURRENT SYMPTOM: "Have you had difficulty breathing before?" If Yes, ask: "When was the last time?" and "What happened that time?"      COPD history 6. CARDIAC HISTORY: "Do you have any history of heart disease?" (e.g., heart attack, angina, bypass surgery, angioplasty)      No  7. LUNG HISTORY: "Do you have any history of lung  disease?"  (e.g., pulmonary embolus, asthma, emphysema)     COPD 8. CAUSE: "What do you think is causing the breathing problem?"      Sinus infection- COPD treatment is helping 9. OTHER SYMPTOMS: "Do you have any other symptoms? (e.g., dizziness, runny nose, cough, chest pain, fever)     Stuffy head, weakness, laryngitis  10. PREGNANCY: "Is there any chance you are pregnant?" "When was your last menstrual period?"       n/a 11. TRAVEL: "Have you traveled out of the country in the last month?" (e.g., travel history, exposures)       no  Protocols used: BREATHING DIFFICULTY-A-AH

## 2020-08-16 DIAGNOSIS — E1165 Type 2 diabetes mellitus with hyperglycemia: Secondary | ICD-10-CM | POA: Diagnosis not present

## 2020-08-17 DIAGNOSIS — H53001 Unspecified amblyopia, right eye: Secondary | ICD-10-CM | POA: Diagnosis not present

## 2020-08-17 DIAGNOSIS — H5203 Hypermetropia, bilateral: Secondary | ICD-10-CM | POA: Diagnosis not present

## 2020-08-17 DIAGNOSIS — H26493 Other secondary cataract, bilateral: Secondary | ICD-10-CM | POA: Diagnosis not present

## 2020-08-17 DIAGNOSIS — Z7984 Long term (current) use of oral hypoglycemic drugs: Secondary | ICD-10-CM | POA: Diagnosis not present

## 2020-08-17 DIAGNOSIS — Z961 Presence of intraocular lens: Secondary | ICD-10-CM | POA: Diagnosis not present

## 2020-08-17 DIAGNOSIS — E119 Type 2 diabetes mellitus without complications: Secondary | ICD-10-CM | POA: Diagnosis not present

## 2020-08-17 DIAGNOSIS — H524 Presbyopia: Secondary | ICD-10-CM | POA: Diagnosis not present

## 2020-08-17 DIAGNOSIS — H52223 Regular astigmatism, bilateral: Secondary | ICD-10-CM | POA: Diagnosis not present

## 2020-08-17 LAB — HM DIABETES EYE EXAM

## 2020-08-28 ENCOUNTER — Other Ambulatory Visit: Payer: Self-pay | Admitting: Internal Medicine

## 2020-08-28 DIAGNOSIS — G4700 Insomnia, unspecified: Secondary | ICD-10-CM

## 2020-08-28 DIAGNOSIS — N138 Other obstructive and reflux uropathy: Secondary | ICD-10-CM

## 2020-08-31 DIAGNOSIS — E1059 Type 1 diabetes mellitus with other circulatory complications: Secondary | ICD-10-CM | POA: Diagnosis not present

## 2020-08-31 DIAGNOSIS — E1065 Type 1 diabetes mellitus with hyperglycemia: Secondary | ICD-10-CM | POA: Diagnosis not present

## 2020-08-31 DIAGNOSIS — E10649 Type 1 diabetes mellitus with hypoglycemia without coma: Secondary | ICD-10-CM | POA: Diagnosis not present

## 2020-08-31 DIAGNOSIS — E1040 Type 1 diabetes mellitus with diabetic neuropathy, unspecified: Secondary | ICD-10-CM | POA: Diagnosis not present

## 2020-09-15 DIAGNOSIS — E1165 Type 2 diabetes mellitus with hyperglycemia: Secondary | ICD-10-CM | POA: Diagnosis not present

## 2020-09-17 DIAGNOSIS — E1065 Type 1 diabetes mellitus with hyperglycemia: Secondary | ICD-10-CM | POA: Diagnosis not present

## 2020-10-06 DIAGNOSIS — H26493 Other secondary cataract, bilateral: Secondary | ICD-10-CM | POA: Diagnosis not present

## 2020-10-06 DIAGNOSIS — Z961 Presence of intraocular lens: Secondary | ICD-10-CM | POA: Diagnosis not present

## 2020-10-06 DIAGNOSIS — H18413 Arcus senilis, bilateral: Secondary | ICD-10-CM | POA: Diagnosis not present

## 2020-10-06 DIAGNOSIS — H02831 Dermatochalasis of right upper eyelid: Secondary | ICD-10-CM | POA: Diagnosis not present

## 2020-10-06 DIAGNOSIS — H26491 Other secondary cataract, right eye: Secondary | ICD-10-CM | POA: Diagnosis not present

## 2020-10-14 DIAGNOSIS — H524 Presbyopia: Secondary | ICD-10-CM | POA: Diagnosis not present

## 2020-10-14 DIAGNOSIS — H26491 Other secondary cataract, right eye: Secondary | ICD-10-CM | POA: Diagnosis not present

## 2020-10-14 DIAGNOSIS — H53001 Unspecified amblyopia, right eye: Secondary | ICD-10-CM | POA: Diagnosis not present

## 2020-10-14 DIAGNOSIS — Z961 Presence of intraocular lens: Secondary | ICD-10-CM | POA: Diagnosis not present

## 2020-10-14 DIAGNOSIS — H5203 Hypermetropia, bilateral: Secondary | ICD-10-CM | POA: Diagnosis not present

## 2020-10-14 DIAGNOSIS — H52223 Regular astigmatism, bilateral: Secondary | ICD-10-CM | POA: Diagnosis not present

## 2020-10-14 DIAGNOSIS — E119 Type 2 diabetes mellitus without complications: Secondary | ICD-10-CM | POA: Diagnosis not present

## 2020-10-14 DIAGNOSIS — H26492 Other secondary cataract, left eye: Secondary | ICD-10-CM | POA: Diagnosis not present

## 2020-10-14 DIAGNOSIS — Z7984 Long term (current) use of oral hypoglycemic drugs: Secondary | ICD-10-CM | POA: Diagnosis not present

## 2020-10-16 DIAGNOSIS — E1165 Type 2 diabetes mellitus with hyperglycemia: Secondary | ICD-10-CM | POA: Diagnosis not present

## 2020-11-15 DIAGNOSIS — E1165 Type 2 diabetes mellitus with hyperglycemia: Secondary | ICD-10-CM | POA: Diagnosis not present

## 2020-11-18 ENCOUNTER — Other Ambulatory Visit: Payer: Self-pay | Admitting: Internal Medicine

## 2020-11-18 DIAGNOSIS — I700 Atherosclerosis of aorta: Secondary | ICD-10-CM

## 2020-11-18 DIAGNOSIS — F39 Unspecified mood [affective] disorder: Secondary | ICD-10-CM

## 2020-11-25 ENCOUNTER — Other Ambulatory Visit: Payer: Self-pay | Admitting: Internal Medicine

## 2020-11-25 DIAGNOSIS — N138 Other obstructive and reflux uropathy: Secondary | ICD-10-CM

## 2020-12-16 DIAGNOSIS — E1165 Type 2 diabetes mellitus with hyperglycemia: Secondary | ICD-10-CM | POA: Diagnosis not present

## 2020-12-30 ENCOUNTER — Ambulatory Visit (INDEPENDENT_AMBULATORY_CARE_PROVIDER_SITE_OTHER): Payer: Medicare Other

## 2020-12-30 ENCOUNTER — Other Ambulatory Visit: Payer: Self-pay

## 2020-12-30 VITALS — BP 132/78 | HR 87 | Temp 98.3°F | Resp 16 | Ht 67.0 in | Wt 179.2 lb

## 2020-12-30 DIAGNOSIS — Z0000 Encounter for general adult medical examination without abnormal findings: Secondary | ICD-10-CM | POA: Diagnosis not present

## 2020-12-30 NOTE — Progress Notes (Signed)
Subjective:   Paul Stokes is a 65 y.o. male who presents for an Initial Medicare Annual Wellness Visit.  Review of Systems     Cardiac Risk Factors include: advanced age (>39men, >11 women);diabetes mellitus;dyslipidemia;male gender;smoking/ tobacco exposure     Objective:    Today's Vitals   12/30/20 0822  BP: 132/78  Pulse: 87  Resp: 16  Temp: 98.3 F (36.8 C)  TempSrc: Oral  SpO2: 95%  Weight: 179 lb 3.2 oz (81.3 kg)  Height: 5\' 7"  (1.702 m)   Body mass index is 28.07 kg/m.  Advanced Directives 12/30/2020 10/03/2019 09/29/2019 09/25/2019 09/11/2019 09/10/2019 12/11/2017  Does Patient Have a Medical Advance Directive? No No No No No No No  Would patient like information on creating a medical advance directive? Yes (MAU/Ambulatory/Procedural Areas - Information given) No - Patient declined - No - Patient declined No - Patient declined No - Patient declined No - Patient declined    Current Medications (verified) Outpatient Encounter Medications as of 12/30/2020  Medication Sig   albuterol (VENTOLIN HFA) 108 (90 Base) MCG/ACT inhaler Inhale 2 puffs into the lungs every 6 (six) hours as needed for wheezing or shortness of breath.   aspirin 81 MG tablet Take 1 tablet by mouth daily.   B Complex Vitamins (VITAMIN B COMPLEX PO) Take by mouth daily.   BAYER CONTOUR NEXT TEST test strip    buPROPion (WELLBUTRIN SR) 150 MG 12 hr tablet TAKE 1 TABLET BY MOUTH EVERY MORNING AND2 TABLETS AT BEDTIME   Continuous Blood Gluc Sensor (FREESTYLE LIBRE 14 DAY SENSOR) MISC SMARTSIG:1 Kit(s) Topical Every 2 Weeks   fluticasone-salmeterol (ADVAIR HFA) 45-21 MCG/ACT inhaler Inhale 2 puffs into the lungs 2 (two) times daily.   gabapentin (NEURONTIN) 300 MG capsule Take 2 capsules (600 mg total) by mouth 3 (three) times daily.   HUMALOG 100 UNIT/ML injection 80 units per day through insulin pump   pantoprazole (PROTONIX) 40 MG tablet Take 1 tablet (40 mg total) by mouth daily.   pravastatin  (PRAVACHOL) 40 MG tablet TAKE 1 TABLET BY MOUTH DAILY   tamsulosin (FLOMAX) 0.4 MG CAPS capsule TAKE 1 CAPSULE BY MOUTH AT BEDTIME.   VITAMIN D PO Take 5,000 Units by mouth daily.   zolpidem (AMBIEN) 10 MG tablet TAKE ONE TABLET BY MOUTH AT BEDTIME   [DISCONTINUED] HUMALOG 100 UNIT/ML injection    [DISCONTINUED] ketorolac (TORADOL) 10 MG tablet Take 1 tablet (10 mg total) by mouth every 6 (six) hours as needed.   No facility-administered encounter medications on file as of 12/30/2020.    Allergies (verified) Clavulanic acid and Lisinopril   History: Past Medical History:  Diagnosis Date   Diabetes mellitus without complication (HCC)    insulin pump   Emphysema of lung (HCC)    GERD (gastroesophageal reflux disease)    Prostate disease    Past Surgical History:  Procedure Laterality Date   COLONOSCOPY  08/2013   7 tubular adenomas   COLONOSCOPY WITH PROPOFOL N/A 09/10/2019   Procedure: COLONOSCOPY WITH PROPOFOL;  Surgeon: 09/12/2019, MD;  Location: [facility] END[facility]COPY;  Service: Endoscopy;  Laterality: N/A;   COLONOSCOPY WITH PROPOFOL N/A 09/11/2019   Procedure: COLONOSCOPY WITH PROPOFOL;  Surgeon: 09/13/2019, MD;  Location: [facility] ENDOSCOPY;  Service: Gastroenterology;  Laterality: N/A;   Family History  Problem Relation Age of Onset   Diabetes Mother    Diabetes Father    Social History   Socioeconomic History   Marital status: Married    Spouse  name: Not on file   Number of children: 5   Years of education: Not on file   Highest education level: 10th grade  Occupational History   Not on file  Tobacco Use   Smoking status: Every Day    Packs/day: 2.00    Years: 47.00    Pack years: 94.00    Types: Cigarettes    Start date: 05/12/1968    Last attempt to quit: 04/03/2019    Years since quitting: 1.7   Smokeless tobacco: Never   Tobacco comments:    Pt down to half pack per day 12/30/20  Vaping Use   Vaping Use: Never used  Substance and Sexual Activity   Alcohol  use: No    Alcohol/week: 0.0 standard drinks   Drug use: No   Sexual activity: Not on file  Other Topics Concern   Not on file  Social History Narrative   Not on file   Social Determinants of Health   Financial Resource Strain: Low Risk    Difficulty of Paying Living Expenses: Not hard at all  Food Insecurity: No Food Insecurity   Worried About Charity fundraiser in the Last Year: Never true   [place] in the Last Year: Never true  Transportation Needs: No Transportation Needs   Lack of Transportation (Medical): No   Lack of Transportation (Non-Medical): No  Physical Activity: Inactive   Days of Exercise per Week: 0 days   Minutes of Exercise per Session: 0 min  Stress: No Stress Concern Present   Feeling of Stress : Not at all  Social Connections: Moderately Isolated   Frequency of Communication with Friends and Family: More than three times a week   Frequency of Social Gatherings with Friends and Family: More than three times a week   Attends Religious Services: Never   Marine scientist or Organizations: No   Attends Archivist Meetings: Never   Marital Status: Married    Tobacco Counseling Ready to quit: Not Answered Counseling given: Not Answered Tobacco comments: Pt down to half pack per day 12/30/20   Clinical Intake:  Pre-visit preparation completed: Yes  Pain : No/denies pain     BMI - recorded: 28.07 Nutritional Status: BMI 25 -29 Overweight Nutritional Risks: None Diabetes: Yes CBG done?: No Did pt. bring in CBG monitor from home?: No  How often do you need to have someone help you when you read instructions, pamphlets, or other written materials from your doctor or pharmacy?: 1 - Never  Nutrition Risk Assessment:  Has the patient had any N/V/D within the last 2 months?  No  Does the patient have any non-healing wounds?  No  Has the patient had any unintentional weight loss or weight gain?  No   Diabetes:  Is the patient  diabetic?  Yes  If diabetic, was a CBG obtained today?  No  Did the patient bring in their glucometer from home?  No  How often do you monitor your CBG's? Several times per day.   Financial Strains and Diabetes Management:  Are you having any financial strains with the device, your supplies or your medication? No .  Does the patient want to be seen by Chronic Care Management for management of their diabetes?  No  Would the patient like to be referred to a Nutritionist or for Diabetic Management?  No   Diabetic Exams:  Diabetic Eye Exam: Completed 08/17/20 negative retinopathy.   Diabetic Foot Exam:  Completed 07/28/20.   Interpreter Needed?: No  Information entered by :: Clemetine Marker LPN   Activities of Daily Living In your present state of health, do you have any difficulty performing the following activities: 12/30/2020 08/06/2020  Hearing? Y N  Vision? N N  Difficulty concentrating or making decisions? N N  Walking or climbing stairs? N N  Dressing or bathing? N N  Doing errands, shopping? N N  Preparing Food and eating ? N -  Using the Toilet? N -  In the past six months, have you accidently leaked urine? Y -  Do you have problems with loss of bowel control? N -  Managing your Medications? N -  Managing your Finances? N -  Housekeeping or managing your Housekeeping? N -  Some recent data might be hidden    Patient Care Team: Glean Hess, MD as PCP - General (Internal Medicine) Gabriel Carina Betsey Holiday, MD as Physician Assistant (Endocrinology) Idelle Leech, OD (Optometry)  Indicate any recent Medical Services you may have received from other than [facility] providers in the past year (date may be approximate).     Assessment:   This is a routine wellness examination for [place].  Hearing/Vision screen Hearing Screening - Comments:: Pt c/o mild hearing difficulty; information provided on local hearing clinics  Vision Screening - Comments:: Annual vision screenings done by Dr.  Matilde Sprang  Dietary issues and exercise activities discussed: Current Exercise Habits: The patient does not participate in regular exercise at present, Exercise limited by: neurologic condition(s);respiratory conditions(s)   Goals Addressed             This Visit's Progress    Patient Stated       Patient states he would like to maintain current health and activity       Depression Screen PHQ 2/9 Scores 12/30/2020 08/06/2020 12/13/2019 05/29/2019 08/06/2018 06/05/2018 11/15/2017  PHQ - 2 Score 1 0 0 0 2 0 0  PHQ- 9 Score 4 1 0 - 11 - 0    Fall Risk Fall Risk  12/30/2020 08/06/2020 07/02/2019 06/13/2019 05/29/2019  Falls in the past year? 1 0 0 0 0  Number falls in past yr: 1 - 0 0 0  Injury with Fall? 0 - 0 0 0  Risk for fall due to : History of fall(s) - - - -  Follow up Falls prevention discussed Falls evaluation completed - - Falls evaluation completed    [place]:  Any stairs in or around the home? Yes  If so, are there any without handrails? No  Home free of loose throw rugs in walkways, pet beds, electrical cords, etc? Yes  Adequate lighting in your home to reduce risk of falls? Yes   ASSISTIVE DEVICES UTILIZED TO PREVENT FALLS:  Life alert? No  Use of a cane, walker or w/c? No  Grab bars in the bathroom? Yes  Shower chair or bench in shower? No  Elevated toilet seat or a handicapped toilet? No   TIMED UP AND GO:  Was the test performed? Yes .  Length of time to ambulate 10 feet: 5 sec.   Gait steady and fast without use of assistive device  Cognitive Function:     6CIT Screen 12/30/2020 08/06/2018  What Year? 0 points 0 points  What month? 0 points 0 points  What time? 0 points 0 points  Count back from 20 0 points 0 points  Months in reverse 0 points 0 points  Repeat phrase 0 points 0 points  Total Score 0 0    Immunizations Immunization History  Administered Date(s) Administered   Influenza Inj Mdck Quad Pf 02/22/2019    Influenza Split 03/05/2020   Influenza,inj,Quad PF,6+ Mos 03/12/2018   Influenza-Unspecified 03/15/2015, 03/06/2019   PFIZER(Purple Top)SARS-COV-2 Vaccination 07/30/2019, 08/20/2019   Pneumococcal Polysaccharide-23 05/17/2010, 03/05/2020   Zoster, Live 05/23/2013    TDAP status: Due, Education has been provided regarding the importance of this vaccine. Advised may receive this vaccine at local pharmacy or Health Dept. Aware to provide a copy of the vaccination record if obtained from local pharmacy or Health Dept. Verbalized acceptance and understanding.  Flu Vaccine status: Up to date  Pneumococcal vaccine status: Up to date  Covid-19 vaccine status: Completed vaccines  Qualifies for Shingles Vaccine? Yes   Zostavax completed Yes   Shingrix Completed?: No.    Education has been provided regarding the importance of this vaccine. Patient has been advised to call insurance company to determine out of pocket expense if they have not yet received this vaccine. Advised may also receive vaccine at local pharmacy or Health Dept. Verbalized acceptance and understanding.  Screening Tests Health Maintenance  Topic Date Due   Zoster Vaccines- Shingrix (1 of 2) Never done   COVID-19 Vaccine (3 - Booster for Pfizer series) 01/20/2020   INFLUENZA VACCINE  12/14/2020   TETANUS/TDAP  08/06/2021 (Originally 11/12/1974)   HEMOGLOBIN A1C  01/28/2021   PNA vac Low Risk Adult (2 of 2 - PCV13) 03/05/2021   FOOT EXAM  07/28/2021   URINE MICROALBUMIN  07/28/2021   OPHTHALMOLOGY EXAM  08/17/2021   COLONOSCOPY (Pts 45-84yrs Insurance coverage will need to be confirmed)  09/10/2024   Hepatitis C Screening  Completed   HPV VACCINES  Aged Out    Health Maintenance  Health Maintenance Due  Topic Date Due   Zoster Vaccines- Shingrix (1 of 2) Never done   COVID-19 Vaccine (3 - Booster for Pfizer series) 01/20/2020   INFLUENZA VACCINE  12/14/2020    Colorectal cancer screening: Type of screening:  Colonoscopy. Completed 09/11/19. Repeat every 5 years  Lung Cancer Screening: (Low Dose CT Chest recommended if Age 58-80 years, 30 pack-year currently smoking OR have quit w/in 15years.) does qualify. Completed 06/26/20   Additional Screening:  Hepatitis C Screening: does qualify; Completed 06/15/15  Vision Screening: Recommended annual ophthalmology exams for early detection of glaucoma and other disorders of the eye. Is the patient up to date with their annual eye exam?  yes Who is the provider or what is the name of the office in which the patient attends annual eye exams? Dr. Matilde Sprang.   Dental Screening: Recommended annual dental exams for proper oral hygiene  Community Resource Referral / Chronic Care Management: CRR required this visit?  No   CCM required this visit?  No      Plan:     I have personally reviewed and noted the following in the patient's chart:   Medical and social history Use of alcohol, tobacco or illicit drugs  Current medications and supplements including opioid prescriptions. Patient is not currently taking opioid prescriptions. Functional ability and status Nutritional status Physical activity Advanced directives List of other physicians Hospitalizations, surgeries, and ER visits in previous 12 months Vitals Screenings to include cognitive, depression, and falls Referrals and appointments  In addition, I have reviewed and discussed with patient certain preventive protocols, quality metrics, and best practice recommendations. A written personalized care plan for preventive services as  well as general preventive health recommendations were provided to patient.     Clemetine Marker, LPN   07/15/4994   Nurse Notes: pt c/o recent episode of blood in urine with possible blots and difficulty with stream of urine at times. Pt does not see urology; hx of prostate issues. Pt advised to schedule appt per Dr. Army Melia. Pt scheduled for 01/05/21 11:20. Pt also advised  due for A1c; plans to schedule follow up appt with Dr. Gabriel Carina

## 2020-12-30 NOTE — Patient Instructions (Addendum)
Paul Stokes , Thank you for taking time to come for your Medicare Wellness Visit. I appreciate your ongoing commitment to your health goals. Please review the following plan we discussed and let me know if I can assist you in the future.   Screening recommendations/referrals: Colonoscopy: done 09/11/19. Repeat in 08/2024 Recommended yearly ophthalmology/optometry visit for glaucoma screening and checkup Recommended yearly dental visit for hygiene and checkup  Vaccinations: Influenza vaccine: done 03/05/20 Pneumococcal vaccine: done 03/05/20 Tdap vaccine: due Shingles vaccine: Shingrix discussed. Please contact your pharmacy for coverage information.  Covid-19: done 07/30/19 & 08/20/19  Advanced directives: Advance directive discussed with you today. I have provided a copy for you to complete at home and have notarized. Once this is complete please bring a copy in to our office so we can scan it into your chart.   Conditions/risks identified: Recommend increasing physical activity to at least 3 days per week   Free hearing clinics offered in [facility]:   [address] [place], [place], [place] [address] [phone]  [facility] [place], [place], [place] [address] [phone]   Next appointment: Follow up in one year for your annual wellness visit.   Preventive Care 66 Years and Older, Male Preventive care refers to lifestyle choices and visits with your health care provider that can promote health and wellness. What does preventive care include? A yearly physical exam. This is also called an annual well check. Dental exams once or twice a year. Routine eye exams. Ask your health care provider how often you should have your eyes checked. Personal lifestyle choices, including: Daily care of your teeth and gums. Regular physical activity. Eating a healthy diet. Avoiding tobacco and drug use. Limiting alcohol use. Practicing safe  sex. Taking low doses of aspirin every day. Taking vitamin and mineral supplements as recommended by your health care provider. What happens during an annual well check? The services and screenings done by your health care provider during your annual well check will depend on your age, overall health, lifestyle risk factors, and family history of disease. Counseling  Your health care provider may ask you questions about your: Alcohol use. Tobacco use. Drug use. Emotional well-being. Home and relationship well-being. Sexual activity. Eating habits. History of falls. Memory and ability to understand (cognition). Work and work Statistician. Screening  You may have the following tests or measurements: Height, weight, and BMI. Blood pressure. Lipid and cholesterol levels. These may be checked every 5 years, or more frequently if you are over 24 years old. Skin check. Lung cancer screening. You may have this screening every year starting at age 18 if you have a 30-pack-year history of smoking and currently smoke or have quit within the past 15 years. Fecal occult blood test (FOBT) of the stool. You may have this test every year starting at age 53. Flexible sigmoidoscopy or colonoscopy. You may have a sigmoidoscopy every 5 years or a colonoscopy every 10 years starting at age 82. Prostate cancer screening. Recommendations will vary depending on your family history and other risks. Hepatitis C blood test. Hepatitis B blood test. Sexually transmitted disease (STD) testing. Diabetes screening. This is done by checking your blood sugar (glucose) after you have not eaten for a while (fasting). You may have this done every 1-3 years. Abdominal aortic aneurysm (AAA) screening. You may need this if you are a current or former smoker. Osteoporosis. You may be screened starting at age 39 if  you are at high risk. Talk with your health care provider about your test results, treatment options, and if  necessary, the need for more tests. Vaccines  Your health care provider may recommend certain vaccines, such as: Influenza vaccine. This is recommended every year. Tetanus, diphtheria, and acellular pertussis (Tdap, Td) vaccine. You may need a Td booster every 10 years. Zoster vaccine. You may need this after age 73. Pneumococcal 13-valent conjugate (PCV13) vaccine. One dose is recommended after age 42. Pneumococcal polysaccharide (PPSV23) vaccine. One dose is recommended after age 37. Talk to your health care provider about which screenings and vaccines you need and how often you need them. This information is not intended to replace advice given to you by your health care provider. Make sure you discuss any questions you have with your health care provider. Document Released: 05/29/2015 Document Revised: 01/20/2016 Document Reviewed: 03/03/2015 Elsevier Interactive Patient Education  2017 [place] Prevention in the Home Falls can cause injuries. They can happen to people of all ages. There are many things you can do to make your home safe and to help prevent falls. What can I do on the outside of my home? Regularly fix the edges of walkways and driveways and fix any cracks. Remove anything that might make you trip as you walk through a door, such as a raised step or threshold. Trim any bushes or trees on the path to your home. Use bright outdoor lighting. Clear any walking paths of anything that might make someone trip, such as rocks or tools. Regularly check to see if handrails are loose or broken. Make sure that both sides of any steps have handrails. Any raised decks and porches should have guardrails on the edges. Have any leaves, snow, or ice cleared regularly. Use sand or salt on walking paths during winter. Clean up any spills in your garage right away. This includes oil or grease spills. What can I do in the bathroom? Use night lights. Install grab bars by the toilet  and in the tub and shower. Do not use towel bars as grab bars. Use non-skid mats or decals in the tub or shower. If you need to sit down in the shower, use a plastic, non-slip stool. Keep the floor dry. Clean up any water that spills on the floor as soon as it happens. Remove soap buildup in the tub or shower regularly. Attach bath mats securely with double-sided non-slip rug tape. Do not have throw rugs and other things on the floor that can make you trip. What can I do in the bedroom? Use night lights. Make sure that you have a light by your bed that is easy to reach. Do not use any sheets or blankets that are too big for your bed. They should not hang down onto the floor. Have a firm chair that has side arms. You can use this for support while you get dressed. Do not have throw rugs and other things on the floor that can make you trip. What can I do in the kitchen? Clean up any spills right away. Avoid walking on wet floors. Keep items that you use a lot in easy-to-reach places. If you need to reach something above you, use a strong step stool that has a grab bar. Keep electrical cords out of the way. Do not use floor polish or wax that makes floors slippery. If you must use wax, use non-skid floor wax. Do not have throw rugs and other things on  the floor that can make you trip. What can I do with my stairs? Do not leave any items on the stairs. Make sure that there are handrails on both sides of the stairs and use them. Fix handrails that are broken or loose. Make sure that handrails are as long as the stairways. Check any carpeting to make sure that it is firmly attached to the stairs. Fix any carpet that is loose or worn. Avoid having throw rugs at the top or bottom of the stairs. If you do have throw rugs, attach them to the floor with carpet tape. Make sure that you have a light switch at the top of the stairs and the bottom of the stairs. If you do not have them, ask someone to add  them for you. What else can I do to help prevent falls? Wear shoes that: Do not have high heels. Have rubber bottoms. Are comfortable and fit you well. Are closed at the toe. Do not wear sandals. If you use a stepladder: Make sure that it is fully opened. Do not climb a closed stepladder. Make sure that both sides of the stepladder are locked into place. Ask someone to hold it for you, if possible. Clearly mark and make sure that you can see: Any grab bars or handrails. First and last steps. Where the edge of each step is. Use tools that help you move around (mobility aids) if they are needed. These include: Canes. Walkers. Scooters. Crutches. Turn on the lights when you go into a dark area. Replace any light bulbs as soon as they burn out. Set up your furniture so you have a clear path. Avoid moving your furniture around. If any of your floors are uneven, fix them. If there are any pets around you, be aware of where they are. Review your medicines with your doctor. Some medicines can make you feel dizzy. This can increase your chance of falling. Ask your doctor what other things that you can do to help prevent falls. This information is not intended to replace advice given to you by your health care provider. Make sure you discuss any questions you have with your health care provider. Document Released: 02/26/2009 Document Revised: 10/08/2015 Document Reviewed: 06/06/2014 Elsevier Interactive Patient Education  2017 [employer].

## 2021-01-05 ENCOUNTER — Ambulatory Visit (INDEPENDENT_AMBULATORY_CARE_PROVIDER_SITE_OTHER): Payer: Medicare Other | Admitting: Internal Medicine

## 2021-01-05 ENCOUNTER — Encounter: Payer: Self-pay | Admitting: Internal Medicine

## 2021-01-05 ENCOUNTER — Other Ambulatory Visit: Payer: Self-pay

## 2021-01-05 VITALS — BP 120/70 | HR 72 | Temp 97.6°F | Ht 67.0 in | Wt 179.0 lb

## 2021-01-05 DIAGNOSIS — Z87448 Personal history of other diseases of urinary system: Secondary | ICD-10-CM

## 2021-01-05 DIAGNOSIS — Z87898 Personal history of other specified conditions: Secondary | ICD-10-CM

## 2021-01-05 LAB — POCT URINALYSIS DIPSTICK
Bilirubin, UA: NEGATIVE
Blood, UA: NEGATIVE
Glucose, UA: NEGATIVE
Ketones, UA: NEGATIVE
Leukocytes, UA: NEGATIVE
Nitrite, UA: NEGATIVE
Protein, UA: NEGATIVE
Spec Grav, UA: 1.015 (ref 1.010–1.025)
Urobilinogen, UA: 0.2 E.U./dL
pH, UA: 6 (ref 5.0–8.0)

## 2021-01-05 NOTE — Progress Notes (Signed)
Date:  01/05/2021   Name:  Paul Stokes   DOB:  1955-08-08   MRN:  [id number]   Chief Complaint: Hematuria (seems like its blood clots for 3-4 months off and on , X3-4 weeks ago,   had blood in urine big squirt of blood, lasted a couple of days but has not had any problem since, could not urinate well )  Hematuria This is a recurrent problem. Episode onset: months ago. The problem has been waxing and waning since onset. He describes the hematuria as gross hematuria. The hematuria occurs during the initial portion of his urinary stream. He reports clotting at the middle of his urine stream. He is experiencing no pain. He describes his urine color as yellow. Irritative symptoms do not include nocturia or urgency. Obstructive symptoms include straining. Pertinent negatives include no bladder pain, chills, dysuria, fever, flank pain or inability to urinate. His past medical history is significant for BPH and tobacco use.   Lab Results  Component Value Date   CREATININE 1.07 08/06/2020   BUN 13 08/06/2020   NA 138 08/06/2020   K 4.6 08/06/2020   CL 100 08/06/2020   CO2 20 08/06/2020   Lab Results  Component Value Date   CHOL 121 08/06/2020   HDL 44 08/06/2020   LDLCALC 59 08/06/2020   TRIG 97 08/06/2020   CHOLHDL 2.8 08/06/2020   Lab Results  Component Value Date   TSH 1.510 08/06/2020   Lab Results  Component Value Date   HGBA1C 8.3 07/28/2020   Lab Results  Component Value Date   WBC 9.0 09/29/2019   HGB 15.2 09/29/2019   HCT 44.4 09/29/2019   MCV 85.7 09/29/2019   PLT 392 09/29/2019   Lab Results  Component Value Date   ALT 16 08/06/2020   AST 19 08/06/2020   ALKPHOS 106 08/06/2020   BILITOT 0.4 08/06/2020     Review of Systems  Constitutional:  Negative for chills and fever.  Genitourinary:  Positive for hematuria. Negative for dysuria, flank pain, nocturia and urgency.   Patient Active Problem List   Diagnosis Date Noted   Centrilobular emphysema ([place])  12/11/2019   Personal history of colonic polyps    Aortic atherosclerosis ([place]) 08/27/2019   Dyspepsia 08/06/2018   Acute bilateral low back pain with right-sided sciatica 11/29/2017   Mood disorder ([place]) 11/15/2016   BPH with obstruction/lower urinary tract symptoms 06/15/2015   Hyperlipidemia due to type 1 diabetes mellitus ([place]) 06/15/2015   Insomnia, persistent 03/06/2015   Compulsive tobacco user syndrome 03/06/2015   Type 1 diabetes mellitus with sensory neuropathy ([place]) 03/06/2015   Presence of insulin pump 06/08/2014    Allergies  Allergen Reactions   Clavulanic Acid Nausea And Vomiting   Lisinopril Cough    Past Surgical History:  Procedure Laterality Date   COLONOSCOPY  08/2013   7 tubular adenomas   COLONOSCOPY WITH PROPOFOL N/A 09/10/2019   Procedure: COLONOSCOPY WITH PROPOFOL;  Surgeon: [name], MD;  [facility] END[facility]COPY;  Service: Endoscopy;  Laterality: N/A;   COLONOSCOPY WITH PROPOFOL N/A 09/11/2019   Procedure: COLONOSCOPY WITH PROPOFOL;  Surgeon: Jonathon Bellows, MD;  Location: [facility] ENDOSCOPY;  Service: Gastroenterology;  Laterality: N/A;    Social History   Tobacco Use   Smoking status: Every Day    Packs/day: 0.50    Years: 47.00    Pack years: 23.50    Types: Cigarettes    Start date: 05/12/1968   Smokeless tobacco: Never  Vaping  Use   Vaping Use: Never used  Substance Use Topics   Alcohol use: No    Alcohol/week: 0.0 standard drinks   Drug use: No     Medication list has been reviewed and updated.  Current Meds  Medication Sig   albuterol (VENTOLIN HFA) 108 (90 Base) MCG/ACT inhaler Inhale 2 puffs into the lungs every 6 (six) hours as needed for wheezing or shortness of breath.   aspirin 81 MG tablet Take 1 tablet by mouth daily.   B Complex Vitamins (VITAMIN B COMPLEX PO) Take by mouth daily.   BAYER CONTOUR NEXT TEST test strip    buPROPion (WELLBUTRIN SR) 150 MG 12 hr tablet TAKE 1 TABLET BY MOUTH EVERY MORNING AND2 TABLETS AT  BEDTIME   Continuous Blood Gluc Sensor (FREESTYLE LIBRE 14 DAY SENSOR) MISC SMARTSIG:1 Kit(s) Topical Every 2 Weeks   fluticasone-salmeterol (ADVAIR HFA) 45-21 MCG/ACT inhaler Inhale 2 puffs into the lungs 2 (two) times daily.   gabapentin (NEURONTIN) 300 MG capsule Take 2 capsules (600 mg total) by mouth 3 (three) times daily.   HUMALOG 100 UNIT/ML injection 80 units per day through insulin pump   pantoprazole (PROTONIX) 40 MG tablet Take 1 tablet (40 mg total) by mouth daily.   pravastatin (PRAVACHOL) 40 MG tablet TAKE 1 TABLET BY MOUTH DAILY   tamsulosin (FLOMAX) 0.4 MG CAPS capsule TAKE 1 CAPSULE BY MOUTH AT BEDTIME.   VITAMIN D PO Take 5,000 Units by mouth daily.   zolpidem (AMBIEN) 10 MG tablet TAKE ONE TABLET BY MOUTH AT BEDTIME    PHQ 2/9 Scores 01/05/2021 12/30/2020 08/06/2020 12/13/2019  PHQ - 2 Score 0 1 0 0  PHQ- 9 Score $Remov'5 4 1 'wuISLP$ 0    GAD 7 : Generalized Anxiety Score 01/05/2021 08/06/2020 11/15/2017 09/04/2017  Nervous, Anxious, on Edge 0 0 1 3  Control/stop worrying 0 0 0 3  Worry too much - different things 0 0 1 3  Trouble relaxing 0 0 1 3  Restless 1 0 0 2  Easily annoyed or irritable 1 0 3 3  Afraid - awful might happen 0 0 0 0  Total GAD 7 Score 2 0 6 17  Anxiety Difficulty - - Not difficult at all Extremely difficult    BP Readings from Last 3 Encounters:  01/05/21 120/70  12/30/20 132/78  08/06/20 122/68    Physical Exam Vitals and nursing note reviewed.  Constitutional:      General: He is not in acute distress.    Appearance: He is well-developed.  HENT:     Head: Normocephalic and atraumatic.  Cardiovascular:     Rate and Rhythm: Normal rate and regular rhythm.     Pulses: Normal pulses.  Pulmonary:     Effort: Pulmonary effort is normal. No respiratory distress.  Musculoskeletal:     Cervical back: Normal range of motion.  Skin:    General: Skin is warm and dry.     Findings: No rash.  Neurological:     Mental Status: He is alert and oriented to  person, place, and time.     Gait: Gait abnormal.  Psychiatric:        Mood and Affect: Mood normal.        Behavior: Behavior normal.    Wt Readings from Last 3 Encounters:  01/05/21 179 lb (81.2 kg)  12/30/20 179 lb 3.2 oz (81.3 kg)  08/06/20 181 lb (82.1 kg)    BP 120/70   Pulse 72   Temp  97.6 F (36.4 C) (Oral)   Ht $R'5\' 7"'vw$  (1.702 m)   Wt 179 lb (81.2 kg)   SpO2 97%   BMI 28.04 kg/m   Assessment and Plan: 1. History of gross hematuria UA is negative today but he has had intermittent bleeding for some time. He resumed cigarette smoking after recently quitting posing a risk factor for bladder CA. - POCT Urinalysis Dipstick - Ambulatory referral to Urology   Partially dictated using Dragon software. Any errors are unintentional.  Halina Maidens, MD [facility]  01/05/2021

## 2021-01-06 DIAGNOSIS — E1040 Type 1 diabetes mellitus with diabetic neuropathy, unspecified: Secondary | ICD-10-CM | POA: Diagnosis not present

## 2021-01-06 DIAGNOSIS — E109 Type 1 diabetes mellitus without complications: Secondary | ICD-10-CM | POA: Diagnosis not present

## 2021-01-06 DIAGNOSIS — Z794 Long term (current) use of insulin: Secondary | ICD-10-CM | POA: Diagnosis not present

## 2021-01-06 DIAGNOSIS — E1065 Type 1 diabetes mellitus with hyperglycemia: Secondary | ICD-10-CM | POA: Diagnosis not present

## 2021-01-12 ENCOUNTER — Other Ambulatory Visit: Payer: Self-pay

## 2021-01-12 ENCOUNTER — Ambulatory Visit: Payer: Medicare Other | Admitting: Urology

## 2021-01-12 ENCOUNTER — Other Ambulatory Visit: Payer: Self-pay | Admitting: *Deleted

## 2021-01-12 ENCOUNTER — Other Ambulatory Visit
Admission: RE | Admit: 2021-01-12 | Discharge: 2021-01-12 | Disposition: A | Discharge status: Home / Self Care | Payer: Medicare Other | Attending: Urology | Admitting: Urology

## 2021-01-12 ENCOUNTER — Encounter: Payer: Self-pay | Admitting: Urology

## 2021-01-12 VITALS — BP 122/79 | HR 86 | Ht 67.0 in | Wt 176.0 lb

## 2021-01-12 DIAGNOSIS — R310 Gross hematuria: Secondary | ICD-10-CM | POA: Diagnosis not present

## 2021-01-12 DIAGNOSIS — N401 Enlarged prostate with lower urinary tract symptoms: Secondary | ICD-10-CM

## 2021-01-12 DIAGNOSIS — N138 Other obstructive and reflux uropathy: Secondary | ICD-10-CM | POA: Diagnosis not present

## 2021-01-12 LAB — URINALYSIS, COMPLETE (UACMP) WITH MICROSCOPIC
Bacteria, UA: NONE SEEN
Bilirubin Urine: NEGATIVE
Glucose, UA: 500 mg/dL — AB
Hgb urine dipstick: NEGATIVE
Ketones, ur: NEGATIVE mg/dL
Leukocytes,Ua: NEGATIVE
Nitrite: NEGATIVE
Protein, ur: NEGATIVE mg/dL
Specific Gravity, Urine: 1.02 (ref 1.005–1.030)
pH: 6 (ref 5.0–8.0)

## 2021-01-12 LAB — BLADDER SCAN AMB NON-IMAGING

## 2021-01-12 NOTE — Patient Instructions (Signed)
Cystoscopy Cystoscopy is a procedure that is used to help diagnose and sometimes treat conditions that affect the lower urinary tract. The lower urinary tract includes the bladder and the urethra. The urethra is the tube that drains urine from the bladder. Cystoscopy is done using a thin, tube-shaped instrument with a light and camera at the end (cystoscope). The cystoscope may be hard or flexible, depending on the goal of theprocedure. The cystoscope is inserted through the urethra, into the bladder. Cystoscopy may be recommended if you have: Urinary tract infections that keep coming back. Blood in the urine (hematuria). An inability to control when you urinate (urinary incontinence) or an overactive bladder. Unusual cells found in a urine sample. A blockage in the urethra, such as a urinary stone. Painful urination. An abnormality in the bladder found during an intravenous pyelogram (IVP) or CT scan. Cystoscopy may also be done to remove a sample of tissue to be examined under a microscope (biopsy). Tell a health care provider about: Any allergies you have. All medicines you are taking, including vitamins, herbs, eye drops, creams, and over-the-counter medicines. Any problems you or family members have had with anesthetic medicines. Any blood disorders you have. Any surgeries you have had. Any medical conditions you have. Whether you are pregnant or may be pregnant. What are the risks? Generally, this is a safe procedure. However, problems may occur, including: Infection. Bleeding. Allergic reactions to medicines. Damage to other structures or organs. What happens before the procedure? Medicines Ask your health care provider about: Changing or stopping your regular medicines. This is especially important if you are taking diabetes medicines or blood thinners. Taking medicines such as aspirin and ibuprofen. These medicines can thin your blood. Do not take these medicines unless your  health care provider tells you to take them. Taking over-the-counter medicines, vitamins, herbs, and supplements. Tests You may have an exam or testing, such as: X-rays of the bladder, urethra, or kidneys. CT scan of the abdomen or pelvis. Urine tests to check for signs of infection. General instructions Follow instructions from your health care provider about eating or drinking restrictions. Ask your health care provider what steps will be taken to help prevent infection. These steps may include: Washing skin with a germ-killing soap. Taking antibiotic medicine. Plan to have a responsible adult take you home from the hospital or clinic. What happens during the procedure?  You will be given one or more of the following: A medicine to help you relax (sedative). A medicine to numb the area (local anesthetic). The area around the opening of your urethra will be cleaned. The cystoscope will be passed through your urethra into your bladder. Germ-free (sterile) fluid will flow through the cystoscope to fill your bladder. The fluid will stretch your bladder so that your health care provider can clearly examine your bladder walls. Your doctor will look at the urethra and bladder. Your doctor may take a biopsy or remove stones. The cystoscope will be removed, and your bladder will be emptied. The procedure may vary among health care providers and hospitals. What can I expect after the procedure? After the procedure, it is common to have: Some soreness or pain in your abdomen and urethra. Urinary symptoms. These include: Mild pain or burning when you urinate. Pain should stop within a few minutes after you urinate. This may last for up to 1 week. A small amount of blood in your urine for several days. Feeling like you need to urinate but producing only a  small amount of urine. Follow these instructions at home: Medicines Take over-the-counter and prescription medicines only as told by your  health care provider. If you were prescribed an antibiotic medicine, take it as told by your health care provider. Do not stop taking the antibiotic even if you start to feel better. General instructions Return to your normal activities as told by your health care provider. Ask your health care provider what activities are safe for you. If you were given a sedative during the procedure, it can affect you for several hours. Do not drive or operate machinery until your health care provider says that it is safe. Watch for any blood in your urine. If the amount of blood in your urine increases, call your health care provider. Follow instructions from your health care provider about eating or drinking restrictions. If a tissue sample was removed for testing (biopsy) during your procedure, it is up to you to get your test results. Ask your health care provider, or the department that is doing the test, when your results will be ready. Drink enough fluid to keep your urine pale yellow. Keep all follow-up visits. This is important. Contact a health care provider if: You have pain that gets worse or does not get better with medicine, especially pain when you urinate. You have trouble urinating. You have more blood in your urine. Get help right away if: You have blood clots in your urine. You have abdominal pain. You have a fever or chills. You are unable to urinate. Summary Cystoscopy is a procedure that is used to help diagnose and sometimes treat conditions that affect the lower urinary tract. Cystoscopy is done using a thin, tube-shaped instrument with a light and camera at the end. After the procedure, it is common to have some soreness or pain in your abdomen and urethra. Watch for any blood in your urine. If the amount of blood in your urine increases, call your health care provider. If you were prescribed an antibiotic medicine, take it as told by your health care provider. Do not stop taking  the antibiotic even if you start to feel better. This information is not intended to replace advice given to you by your health care provider. Make sure you discuss any questions you have with your healthcare provider. Document Revised: 12/13/2019 Document Reviewed: 12/13/2019 Elsevier Patient Education  [place].

## 2021-01-12 NOTE — Progress Notes (Signed)
   01/12/21 1:09 PM   Paul Stokes 01-Jun-1955 [id number]  CC: Gross hematuria, urinary symptoms  HPI: I saw Paul Stokes and his son today for evaluation of the above issues.  He is a 65 year old male with history of diabetes who reports about a year of intermittent gross hematuria.  This occurs when he strains for urination and will last 2 to 3 days then resolves.  It is painless and he denies any dysuria.  He denies any history of UTI or urinary retention.  He takes Flomax at baseline, and if he misses a dose he notices significant weakening of his urinary stream.  He has nocturia 5-6 times overnight and weak stream during the day.  There is no prior imaging to review.  Urinalysis with PCP, and today with [place] is benign.  He has extensive smoking history > 100 pack years.  PVR today normal at 85 mL.  PMH: Past Medical History:  Diagnosis Date   Diabetes mellitus without complication (HCC)    insulin pump   Emphysema of lung (HCC)    GERD (gastroesophageal reflux disease)    Prostate disease     Surgical History: Past Surgical History:  Procedure Laterality Date   COLONOSCOPY  08/2013   7 tubular adenomas   COLONOSCOPY WITH PROPOFOL N/A 09/10/2019   Procedure: COLONOSCOPY WITH PROPOFOL;  Surgeon: [name], MD;  Location: [facility] ENDOSCOPY;  Service: Endoscopy;  Laterality: N/A;   COLONOSCOPY WITH PROPOFOL N/A 09/11/2019   Procedure: COLONOSCOPY WITH PROPOFOL;  Surgeon: Jonathon Bellows, MD;  Location: [facility] ENDOSCOPY;  Service: Gastroenterology;  Laterality: N/A;    Family History: Family History  Problem Relation Age of Onset   Diabetes Mother    Diabetes Father     Social History:  reports that he has been smoking cigarettes. He started smoking about 52 years ago. He has a 23.50 pack-year smoking history. He has never used smokeless tobacco. He reports that he does not drink alcohol and does not use drugs.  Physical Exam: BP 122/79   Pulse 86   Ht '5\' 7"'$  (1.702 m)   Wt 176 lb  (79.8 kg)   BMI 27.57 kg/m    Constitutional:  Alert and oriented, No acute distress. Cardiovascular: No clubbing, cyanosis, or edema. Respiratory: Normal respiratory effort, no increased work of breathing. GI: Abdomen is soft, nontender, nondistended, no abdominal masses   Laboratory Data: Reviewed, see HPI  Assessment & Plan:   65 year old male with intermittent gross hematuria primarily with straining with urination, and benign urinalysis with PCP in with [place] today.  He has significant urinary symptoms of weak stream and nocturia.  We discussed common possible etiologies of hematuria including BPH, malignancy, urolithiasis, medical renal disease, and idiopathic. Standard workup recommended by the AUA includes imaging with CT urogram to assess the upper tracts, and cystoscopy. Cytology is performed on patient's with gross hematuria to look for malignant cells in the urine.  -CT urogram and cystoscopy -Consider finasteride versus outlet procedures pending above findings were regarding significant BPH symptoms despite Flomax    Nickolas Madrid, MD 01/12/2021  [facility] [address] [place], [place] [address] [phone]

## 2021-01-16 DIAGNOSIS — E1165 Type 2 diabetes mellitus with hyperglycemia: Secondary | ICD-10-CM | POA: Diagnosis not present

## 2021-02-05 DIAGNOSIS — E782 Mixed hyperlipidemia: Secondary | ICD-10-CM | POA: Diagnosis not present

## 2021-02-05 DIAGNOSIS — E1059 Type 1 diabetes mellitus with other circulatory complications: Secondary | ICD-10-CM | POA: Diagnosis not present

## 2021-02-05 DIAGNOSIS — E10649 Type 1 diabetes mellitus with hypoglycemia without coma: Secondary | ICD-10-CM | POA: Diagnosis not present

## 2021-02-05 DIAGNOSIS — E1040 Type 1 diabetes mellitus with diabetic neuropathy, unspecified: Secondary | ICD-10-CM | POA: Diagnosis not present

## 2021-02-05 DIAGNOSIS — Z4681 Encounter for fitting and adjustment of insulin pump: Secondary | ICD-10-CM | POA: Diagnosis not present

## 2021-02-05 DIAGNOSIS — E1065 Type 1 diabetes mellitus with hyperglycemia: Secondary | ICD-10-CM | POA: Diagnosis not present

## 2021-02-05 LAB — HEMOGLOBIN A1C: Hemoglobin A1C: 9.8

## 2021-02-11 ENCOUNTER — Ambulatory Visit: Payer: Medicare Other

## 2021-02-11 ENCOUNTER — Other Ambulatory Visit: Payer: Medicare Other | Admitting: Urology

## 2021-02-15 DIAGNOSIS — E1165 Type 2 diabetes mellitus with hyperglycemia: Secondary | ICD-10-CM | POA: Diagnosis not present

## 2021-02-22 ENCOUNTER — Other Ambulatory Visit: Payer: Self-pay | Admitting: Internal Medicine

## 2021-02-22 DIAGNOSIS — N138 Other obstructive and reflux uropathy: Secondary | ICD-10-CM

## 2021-02-22 DIAGNOSIS — G4700 Insomnia, unspecified: Secondary | ICD-10-CM

## 2021-02-22 NOTE — Telephone Encounter (Signed)
Please review

## 2021-03-03 ENCOUNTER — Ambulatory Visit
Admission: RE | Admit: 2021-03-03 | Discharge: 2021-03-03 | Disposition: A | Discharge status: Home / Self Care | Payer: Medicare Other | Source: Ambulatory Visit | Attending: Urology | Admitting: Urology

## 2021-03-03 ENCOUNTER — Other Ambulatory Visit: Payer: Self-pay

## 2021-03-03 DIAGNOSIS — R310 Gross hematuria: Secondary | ICD-10-CM | POA: Insufficient documentation

## 2021-03-03 DIAGNOSIS — I700 Atherosclerosis of aorta: Secondary | ICD-10-CM | POA: Diagnosis not present

## 2021-03-03 DIAGNOSIS — N281 Cyst of kidney, acquired: Secondary | ICD-10-CM | POA: Diagnosis not present

## 2021-03-03 LAB — POCT I-STAT CREATININE: Creatinine, Ser: 0.9 mg/dL (ref 0.61–1.24)

## 2021-03-03 MED ORDER — IOHEXOL 350 MG/ML SOLN
100.0000 mL | Freq: Once | INTRAVENOUS | Status: AC | PRN
  Administered 2021-03-03: 100 mL via INTRAVENOUS

## 2021-03-09 DIAGNOSIS — E1065 Type 1 diabetes mellitus with hyperglycemia: Secondary | ICD-10-CM | POA: Diagnosis not present

## 2021-03-17 ENCOUNTER — Ambulatory Visit: Payer: Medicare Other | Admitting: Urology

## 2021-03-17 ENCOUNTER — Encounter: Payer: Self-pay | Admitting: Urology

## 2021-03-17 ENCOUNTER — Telehealth: Payer: Self-pay | Admitting: Urology

## 2021-03-17 ENCOUNTER — Other Ambulatory Visit: Payer: Self-pay | Admitting: Urology

## 2021-03-17 ENCOUNTER — Other Ambulatory Visit: Payer: Self-pay

## 2021-03-17 VITALS — BP 124/76 | HR 83 | Ht 67.0 in | Wt 172.0 lb

## 2021-03-17 DIAGNOSIS — N138 Other obstructive and reflux uropathy: Secondary | ICD-10-CM

## 2021-03-17 DIAGNOSIS — N401 Enlarged prostate with lower urinary tract symptoms: Secondary | ICD-10-CM

## 2021-03-17 DIAGNOSIS — Z01818 Encounter for other preprocedural examination: Secondary | ICD-10-CM | POA: Diagnosis not present

## 2021-03-17 DIAGNOSIS — R310 Gross hematuria: Secondary | ICD-10-CM | POA: Diagnosis not present

## 2021-03-17 LAB — URINALYSIS, COMPLETE
Bilirubin, UA: NEGATIVE
Ketones, UA: NEGATIVE
Leukocytes,UA: NEGATIVE
Nitrite, UA: NEGATIVE
Protein,UA: NEGATIVE
Specific Gravity, UA: 1.015 (ref 1.005–1.030)
Urobilinogen, Ur: 0.2 mg/dL (ref 0.2–1.0)
pH, UA: 5.5 (ref 5.0–7.5)

## 2021-03-17 LAB — MICROSCOPIC EXAMINATION: Bacteria, UA: NONE SEEN

## 2021-03-17 MED ORDER — SULFAMETHOXAZOLE-TRIMETHOPRIM 800-160 MG PO TABS
1.0000 | ORAL_TABLET | Freq: Once | ORAL | Status: AC
  Administered 2021-03-17: 1 via ORAL

## 2021-03-17 NOTE — Progress Notes (Signed)
[facility] Posting Form   Surgery Date/Time: Date: 04/02/2021  Surgeon: Dr. Nickolas Madrid, MD  Surgery Location: Day Surgery  Inpt ( No  )   Outpt (Yes)   Obs ( No  )   Diagnosis: N40.1, N13.8 BPH with BOO  -CPT: 58099  Surgery: HOLEP  Stop Anticoagulations: Yes  Cardiac/Medical/Pulmonary Clearance needed: no  *Orders entered into EPIC  Date: 03/17/21   *Case booked in EPIC  Date: 03/17/21  *Notified pt of Surgery: Date: 03/17/21  PRE-OP UA & CX: Yes and CBC obtained 03/17/2021  *Placed into Prior Authorization Work Fabio Bering Date: 03/17/21   Assistant/laser/rep:No

## 2021-03-17 NOTE — Progress Notes (Signed)
Cystoscopy Procedure Note:  Indication: Gross hematuria, urinary symptoms  Weak urinary stream, intermittent painless gross hematuria with clots, worsening symptoms if misses dose of Flomax, nocturia 5-6 times overnight  After informed consent and discussion of the procedure and its risks, Renold L Teixeira was positioned and prepped in the standard fashion. Cystoscopy was performed with a flexible cystoscope. The urethra, bladder neck and entire bladder was visualized in a standard fashion. The prostate was very large with a high bladder neck and large median lobe. The ureteral orifices were visualized in their normal location and orientation.  Mild bladder trabeculations, no suspicious lesions, significant intravesical protrusion of prostate on retroflexion  Imaging: CT urogram reviewed, no filling defects, stones, or masses, prostate measures 128 g  Findings: No suspicious findings, large prostate with intravesical protrusion   ----------------------------------------------------------------------------------------  Assessment and Plan: 65 year old male with diabetes, intermittent gross hematuria and significant urinary symptoms secondary to BPH with significantly enlarged prostate of 128 g on recent CT, no other suspicious findings.  Cytology pending.  We discussed options for his urinary symptoms including observation and continuing Flomax, addition of finasteride, or more definitive outlet procedure like HOLEP.  He is strongly interested in pursuing an outlet procedure with his bothersome obstructive symptoms and intermittent painless gross hematuria likely secondary to BPH.  We discussed the risks and benefits of HoLEP at length.  The procedure requires general anesthesia and takes 1 to 2 hours, and a holmium laser is used to enucleate the prostate and push this tissue into the bladder.  A morcellator is then used to remove this tissue, which is sent for pathology.  The vast majority(>95%) of  patients are able to discharge the same day with a catheter in place for 2 to 3 days, and will follow-up in clinic for a voiding trial.  We specifically discussed the risks of bleeding, infection, retrograde ejaculation, temporary urgency and urge incontinence, very low risk of long-term incontinence, urethral stricture/bladder neck contracture, pathologic evaluation of prostate tissue and possible detection of prostate cancer or other malignancy, and possible need for additional procedures.  Schedule HOLEP Follow-up cytology  Nickolas Madrid, MD 03/17/2021

## 2021-03-17 NOTE — Progress Notes (Signed)
Surgical Physician Order Form  * Scheduling expectation : Next Available  *Length of Case: 2 hours  *Clearance needed: no  *Anticoagulation Instructions: Hold all anticoagulants  *Aspirin Instructions: Hold Aspirin and Plavix  *Post-op visit Date/Instructions: 2-day Foley removal with PA, 10 weeks MD PVR   *Diagnosis: BPH w/BOO  *Procedure: HOLEP (53010)  -Admit type: OUTpatient  -Anesthesia: General  -VTE Prophylaxis Standing Order SCD's       Other:   -Standing Lab Orders Per Anesthesia    Lab other: UA&Urine Culture, CBC  -Standing Test orders EKG/Chest x-ray per Anesthesia       Test other:   - Medications:     Ancef 2gm IV   Other Instructions:

## 2021-03-17 NOTE — Patient Instructions (Signed)

## 2021-03-17 NOTE — Telephone Encounter (Signed)
Per Dr. Diamantina Providence Patient is to be scheduled for [facility]  Paul Stokes was contacted and possible surgical dates were discussed, 04/02/21 was agreed upon for surgery.  Patient was directed to call [phone] between 1-3pm the day before surgery to find out surgical arrival time.  Instructions were given not to eat or drink from midnight on the night before surgery and have a driver for the day of surgery. On the surgery day patient was instructed to enter through the [place] entrance of [facility] report the Same Day Surgery desk.   Pre-Admit Testing will be in contact via phone to set up an interview with the anesthesia team to review your history and medications prior to surgery.   Reminder of this information was sent via mychart to the patient.   Patient is to hold anticoag's and ASA per Dr. Diamantina Providence.

## 2021-03-18 DIAGNOSIS — E1165 Type 2 diabetes mellitus with hyperglycemia: Secondary | ICD-10-CM | POA: Diagnosis not present

## 2021-03-18 LAB — CBC WITH DIFFERENTIAL/PLATELET
Basophils Absolute: 0.1 10*3/uL (ref 0.0–0.2)
Basos: 2 %
EOS (ABSOLUTE): 0.2 10*3/uL (ref 0.0–0.4)
Eos: 3 %
Hematocrit: 41.9 % (ref 37.5–51.0)
Hemoglobin: 13.7 g/dL (ref 13.0–17.7)
Immature Grans (Abs): 0 10*3/uL (ref 0.0–0.1)
Immature Granulocytes: 1 %
Lymphocytes Absolute: 2.3 10*3/uL (ref 0.7–3.1)
Lymphs: 33 %
MCH: 29.5 pg (ref 26.6–33.0)
MCHC: 32.7 g/dL (ref 31.5–35.7)
MCV: 90 fL (ref 79–97)
Monocytes Absolute: 0.7 10*3/uL (ref 0.1–0.9)
Monocytes: 10 %
Neutrophils Absolute: 3.7 10*3/uL (ref 1.4–7.0)
Neutrophils: 51 %
Platelets: 362 10*3/uL (ref 150–450)
RBC: 4.65 x10E6/uL (ref 4.14–5.80)
RDW: 11.9 % (ref 11.6–15.4)
WBC: 7.1 10*3/uL (ref 3.4–10.8)

## 2021-03-18 LAB — CYTOLOGY - NON PAP

## 2021-03-19 ENCOUNTER — Other Ambulatory Visit: Payer: Self-pay | Admitting: Internal Medicine

## 2021-03-19 DIAGNOSIS — G4700 Insomnia, unspecified: Secondary | ICD-10-CM

## 2021-03-19 NOTE — Telephone Encounter (Signed)
Requested medications are on the active medication list yes  Last visit mood addressed 08/06/20, not insomnia, do not see this addressed  Future visit scheduled 05/26/20  Notes to clinic This medication can not be delegated, please assess.  Requested Prescriptions  Pending Prescriptions Disp Refills   zolpidem (AMBIEN) 10 MG tablet 30 tablet 5    Sig: Take 1 tablet (10 mg total) by mouth at bedtime.     Not Delegated - Psychiatry:  Anxiolytics/Hypnotics Failed - 03/19/2021  6:33 PM      Failed - This refill cannot be delegated      Failed - Urine Drug Screen completed in last 360 days      Passed - Valid encounter within last 6 months    Recent Outpatient Visits           2 months ago History of gross hematuria   [facility] Glean Hess, MD   7 months ago Centrilobular emphysema [facility])   [facility] Glean Hess, MD   1 year ago COPD with acute exacerbation [facility])   [facility] Glean Hess, MD   1 year ago Colicky RUQ abdominal pain   [facility] Glean Hess, MD   1 year ago [facility] Glean Hess, MD       Future Appointments             In 2 weeks Debroah Loop, PA-C [place]   In 2 months Army Melia, Jesse Sans, MD [facility], [place]   In 2 months Diamantina Providence, Herbert Seta, [facility]

## 2021-03-19 NOTE — Telephone Encounter (Signed)
Medication: zolpidem (AMBIEN) 10 MG tablet [[id number]] - Pt is calling to request the medication early. Pt will leaving on Monday at [facility] states if they get the ok from the doctor that they can give it to the patient.  Has the patient contacted their pharmacy? YES  (Agent: If no, request that the patient contact the pharmacy for the refill. If patient does not wish to contact the pharmacy document the reason why and proceed with request.) (Agent: If yes, when and what did the pharmacy advise?)  Preferred Pharmacy (with phone number or street name): [place], [place] - [address] [place] [address] Phone: [phone] Fax: [phone] Hours: Not open 24 hours   Has the patient been seen for an appointment in the last year OR does the patient have an upcoming appointment? YES 01/12/21  Agent: Please be advised that RX refills may take up to 3 business days. We ask that you follow-up with your pharmacy.

## 2021-03-21 LAB — CULTURE, URINE COMPREHENSIVE

## 2021-03-22 MED ORDER — ZOLPIDEM TARTRATE 10 MG PO TABS: 10.0000 mg | ORAL_TABLET | Freq: Every day | ORAL | 5 refills | Status: DC

## 2021-03-29 ENCOUNTER — Inpatient Hospital Stay
Admission: RE | Admit: 2021-03-29 | Discharge: 2021-03-29 | Disposition: A | Discharge status: Home / Self Care | Payer: Medicare Other | Source: Ambulatory Visit

## 2021-03-29 NOTE — Patient Instructions (Addendum)
Your procedure is scheduled on: Friday, November 18 Report to the Registration Desk on the 1st floor of the [facility]. To find out your arrival time, please call [phone] between 1PM - 3PM on: Thursday, November 17  REMEMBER: Instructions that are not followed completely may result in serious medical risk, up to and including death; or upon the discretion of your surgeon and anesthesiologist your surgery may need to be rescheduled.  Do not eat or drink after midnight the night before surgery.  No gum chewing, lozengers or hard candies.  TAKE THESE MEDICATIONS THE MORNING OF SURGERY WITH A SIP OF WATER:  Albuterol inhaler Bupropion Advair inhaler Gabapentin Pravastatin  Use inhalers on the day of surgery and bring to the hospital.  **Follow new guidelines for insulin and diabetes medications.**  One week prior to surgery: Stop Anti-inflammatories (NSAIDS) such as Advil, Aleve, Ibuprofen, Motrin, Naproxen, Naprosyn and Aspirin based products such as Excedrin, Goodys Powder, BC Powder. Stop ANY OVER THE COUNTER supplements until after surgery. You may however, continue to take Tylenol if needed for pain up until the day of surgery.  No Alcohol for 24 hours before or after surgery.  No Smoking including e-cigarettes for 24 hours prior to surgery.  No chewable tobacco products for at least 6 hours prior to surgery.  No nicotine patches on the day of surgery.  Do not use any "recreational" drugs for at least a week prior to your surgery.  Please be advised that the combination of cocaine and anesthesia may have negative outcomes, up to and including death. If you test positive for cocaine, your surgery will be cancelled.  On the morning of surgery brush your teeth with toothpaste and water, you may rinse your mouth with mouthwash if you wish. Do not swallow any toothpaste or mouthwash.  Use CHG Soap or wipes as directed on instruction sheet.  Do not wear jewelry, make-up,  hairpins, clips or nail polish.  Do not wear lotions, powders, or perfumes.   Do not shave body from the neck down 48 hours prior to surgery just in case you cut yourself which could leave a site for infection.  Also, freshly shaved skin may become irritated if using the CHG soap.  Contact lenses, hearing aids and dentures may not be worn into surgery.  Do not bring valuables to the hospital. [facility] is not responsible for any missing/lost belongings or valuables.   Total Shoulder Arthroplasty:  use Benzolyl Peroxide 5% Gel as directed on instruction sheet.  Fleets enema or bowel prep as directed.  Bring your C-PAP to the hospital with you in case you may have to spend the night.   Notify your doctor if there is any change in your medical condition (cold, fever, infection).  Wear comfortable clothing (specific to your surgery type) to the hospital.  After surgery, you can help prevent lung complications by doing breathing exercises.  Take deep breaths and cough every 1-2 hours. Your doctor may order a device called an Incentive Spirometer to help you take deep breaths. When coughing or sneezing, hold a pillow firmly against your incision with both hands. This is called "splinting." Doing this helps protect your incision. It also decreases belly discomfort.  If you are being admitted to the hospital overnight, leave your suitcase in the car. After surgery it may be brought to your room.  If you are being discharged the day of surgery, you will not be allowed to drive home. You will need a  responsible adult (18 years or older) to drive you home and stay with you that night.   If you are taking public transportation, you will need to have a responsible adult (18 years or older) with you. Please confirm with your physician that it is acceptable to use public transportation.   Please call the [facility] Dept. at [phone] if you have any questions about these  instructions.  Surgery Visitation Policy:  Patients undergoing a surgery or procedure may have one family member or support person with them as long as that person is not COVID-19 positive or experiencing its symptoms.  That person may remain in the waiting area during the procedure and may rotate out with other people.  Inpatient Visitation:    Visiting hours are 7 a.m. to 8 p.m. Up to two visitors ages 16+ are allowed at one time in a patient room. The visitors may rotate out with other people during the day. Visitors must check out when they leave, or other visitors will not be allowed. One designated support person may remain overnight. The visitor must pass COVID-19 screenings, use hand sanitizer when entering and exiting the patient's room and wear a mask at all times, including in the patient's room. Patients must also wear a mask when staff or their visitor are in the room. Masking is required regardless of vaccination status.

## 2021-03-30 ENCOUNTER — Encounter
Admission: RE | Admit: 2021-03-30 | Discharge: 2021-03-30 | Disposition: A | Discharge status: Home / Self Care | Payer: Medicare Other | Source: Ambulatory Visit | Attending: Urology | Admitting: Urology

## 2021-03-30 ENCOUNTER — Other Ambulatory Visit: Payer: Self-pay

## 2021-03-30 VITALS — BP 144/66 | HR 80 | Temp 97.8°F | Resp 14 | Ht 67.0 in | Wt 175.0 lb

## 2021-03-30 DIAGNOSIS — E1040 Type 1 diabetes mellitus with diabetic neuropathy, unspecified: Secondary | ICD-10-CM | POA: Insufficient documentation

## 2021-03-30 DIAGNOSIS — R54 Age-related physical debility: Secondary | ICD-10-CM | POA: Diagnosis not present

## 2021-03-30 DIAGNOSIS — Z01812 Encounter for preprocedural laboratory examination: Secondary | ICD-10-CM

## 2021-03-30 DIAGNOSIS — Z01818 Encounter for other preprocedural examination: Secondary | ICD-10-CM | POA: Diagnosis not present

## 2021-03-30 DIAGNOSIS — E119 Type 2 diabetes mellitus without complications: Secondary | ICD-10-CM | POA: Diagnosis not present

## 2021-03-30 HISTORY — DX: Depression, unspecified: F32.A

## 2021-03-30 HISTORY — DX: Hyperlipidemia, unspecified: E78.5

## 2021-03-30 LAB — BASIC METABOLIC PANEL
Anion gap: 6 (ref 5–15)
BUN: 20 mg/dL (ref 8–23)
CO2: 25 mmol/L (ref 22–32)
Calcium: 8.9 mg/dL (ref 8.9–10.3)
Chloride: 105 mmol/L (ref 98–111)
Creatinine, Ser: 0.99 mg/dL (ref 0.61–1.24)
GFR, Estimated: >60 mL/min (ref >60)
Glucose, Bld: 144 mg/dL — ABNORMAL HIGH (ref 70–99)
Potassium: 4.1 mmol/L (ref 3.5–5.1)
Sodium: 136 mmol/L (ref 135–145)

## 2021-03-30 NOTE — Patient Instructions (Signed)
Your procedure is scheduled on: Friday, November 18 Report to the [facility]; second floor, surgery information desk. To find out your arrival time, please call [phone] between 1PM - 3PM on: Thursday, November 17  REMEMBER: Instructions that are not followed completely may result in serious medical risk, up to and including death; or upon the discretion of your surgeon and anesthesiologist your surgery may need to be rescheduled.  Do not eat or drink after midnight the night before surgery.  No gum chewing, lozengers or hard candies.  TAKE THESE MEDICATIONS THE MORNING OF SURGERY WITH A SIP OF WATER:  Albuterol inhaler Advair inhaler Bupropion  Gabapentin  Use inhalers on the day of surgery and bring to the hospital.  Leave your insulin pump functioning and leave your dexcom sensor in place.  Contact Dr. Gabriel Carina to see if she wants to have you decrease insulin rate the night before surgery.  One week prior to surgery: Stop aspirin and Anti-inflammatories (NSAIDS) such as Advil, Aleve, Ibuprofen, Motrin, Naproxen, Naprosyn and Aspirin based products such as Excedrin, Goodys Powder, BC Powder. Stop ANY OVER THE COUNTER supplements until after surgery. You may however, continue to take Tylenol if needed for pain up until the day of surgery.  No Alcohol for 24 hours before or after surgery.  No Smoking including e-cigarettes for 24 hours prior to surgery.  No chewable tobacco products for at least 6 hours prior to surgery.  No nicotine patches on the day of surgery.  On the morning of surgery brush your teeth with toothpaste and water, you may rinse your mouth with mouthwash if you wish. Do not swallow any toothpaste or mouthwash.  Do not wear jewelry.  Do not wear lotions, powders, or perfumes.   Do not shave body from the neck down 48 hours prior to surgery just in case you cut yourself which could leave a site for infection.   Contact lenses, hearing aids and dentures  may not be worn into surgery.  Do not bring valuables to the hospital. [facility] is not responsible for any missing/lost belongings or valuables.   Notify your doctor if there is any change in your medical condition (cold, fever, infection).  Wear comfortable clothing (specific to your surgery type) to the hospital.  After surgery, you can help prevent lung complications by doing breathing exercises.  Take deep breaths and cough every 1-2 hours. Your doctor may order a device called an Incentive Spirometer to help you take deep breaths.  If you are being discharged the day of surgery, you will not be allowed to drive home. You will need a responsible adult (18 years or older) to drive you home and stay with you that night.   If you are taking public transportation, you will need to have a responsible adult (18 years or older) with you. Please confirm with your physician that it is acceptable to use public transportation.   Please call the [facility] Dept. at [phone] if you have any questions about these instructions.  Surgery Visitation Policy:  Patients undergoing a surgery or procedure may have one family member or support person with them as long as that person is not COVID-19 positive or experiencing its symptoms.  That person may remain in the waiting area during the procedure and may rotate out with other people.

## 2021-04-02 ENCOUNTER — Ambulatory Visit
Admission: RE | Admit: 2021-04-02 | Discharge: 2021-04-02 | Disposition: A | Discharge status: Home / Self Care | Payer: Medicare Other | Attending: Urology | Admitting: Urology

## 2021-04-02 ENCOUNTER — Encounter
Admission: RE | Disposition: A | Discharge status: Home / Self Care | Payer: Self-pay | Source: Home / Self Care | Attending: Urology

## 2021-04-02 ENCOUNTER — Other Ambulatory Visit: Payer: Self-pay

## 2021-04-02 ENCOUNTER — Ambulatory Visit: Payer: Medicare Other | Admitting: Anesthesiology

## 2021-04-02 ENCOUNTER — Encounter: Payer: Self-pay | Admitting: Urology

## 2021-04-02 DIAGNOSIS — Z794 Long term (current) use of insulin: Secondary | ICD-10-CM | POA: Diagnosis not present

## 2021-04-02 DIAGNOSIS — E785 Hyperlipidemia, unspecified: Secondary | ICD-10-CM | POA: Diagnosis not present

## 2021-04-02 DIAGNOSIS — J439 Emphysema, unspecified: Secondary | ICD-10-CM | POA: Diagnosis not present

## 2021-04-02 DIAGNOSIS — N138 Other obstructive and reflux uropathy: Secondary | ICD-10-CM

## 2021-04-02 DIAGNOSIS — N401 Enlarged prostate with lower urinary tract symptoms: Secondary | ICD-10-CM | POA: Insufficient documentation

## 2021-04-02 DIAGNOSIS — E109 Type 1 diabetes mellitus without complications: Secondary | ICD-10-CM | POA: Insufficient documentation

## 2021-04-02 DIAGNOSIS — F17210 Nicotine dependence, cigarettes, uncomplicated: Secondary | ICD-10-CM | POA: Insufficient documentation

## 2021-04-02 DIAGNOSIS — R310 Gross hematuria: Secondary | ICD-10-CM | POA: Diagnosis not present

## 2021-04-02 HISTORY — PX: HOLEP-LASER ENUCLEATION OF THE PROSTATE WITH MORCELLATION: SHX6641

## 2021-04-02 LAB — GLUCOSE, CAPILLARY
Glucose-Capillary: 176 mg/dL — ABNORMAL HIGH (ref 70–99)
Glucose-Capillary: 110 mg/dL — ABNORMAL HIGH (ref 70–99)

## 2021-04-02 SURGERY — ENUCLEATION, PROSTATE, USING LASER, WITH MORCELLATION
Anesthesia: General

## 2021-04-02 MED ORDER — CEFAZOLIN SODIUM-DEXTROSE 2-4 GM/100ML-% IV SOLN
2.0000 g | INTRAVENOUS | Status: AC
  Administered 2021-04-02: 2 g via INTRAVENOUS

## 2021-04-02 MED ORDER — LIDOCAINE HCL (CARDIAC) PF 100 MG/5ML IV SOSY
PREFILLED_SYRINGE | INTRAVENOUS | Status: DC | PRN
  Administered 2021-04-02: 100 mg via INTRAVENOUS

## 2021-04-02 MED ORDER — SODIUM CHLORIDE 0.9 % IV SOLN: INTRAVENOUS | Status: DC

## 2021-04-02 MED ORDER — FAMOTIDINE 20 MG PO TABS
ORAL_TABLET | ORAL | Status: AC
  Filled 2021-04-02: qty 1

## 2021-04-02 MED ORDER — SUCCINYLCHOLINE CHLORIDE 200 MG/10ML IV SOSY
PREFILLED_SYRINGE | INTRAVENOUS | Status: DC | PRN
  Administered 2021-04-02: 100 mg via INTRAVENOUS

## 2021-04-02 MED ORDER — MIDAZOLAM HCL 2 MG/2ML IJ SOLN
INTRAMUSCULAR | Status: DC | PRN
  Administered 2021-04-02: 2 mg via INTRAVENOUS

## 2021-04-02 MED ORDER — HYDROCODONE-ACETAMINOPHEN 5-325 MG PO TABS: 1.0000 | ORAL_TABLET | Freq: Four times a day (QID) | ORAL | 0 refills | Status: AC | PRN

## 2021-04-02 MED ORDER — LIDOCAINE HCL (PF) 2 % IJ SOLN
INTRAMUSCULAR | Status: AC
  Filled 2021-04-02: qty 5

## 2021-04-02 MED ORDER — OXYCODONE HCL 5 MG/5ML PO SOLN: 5.0000 mg | Freq: Once | ORAL | Status: DC | PRN

## 2021-04-02 MED ORDER — LACTATED RINGERS IV SOLN: INTRAVENOUS | Status: DC

## 2021-04-02 MED ORDER — FAMOTIDINE 20 MG PO TABS
20.0000 mg | ORAL_TABLET | Freq: Once | ORAL | Status: AC
  Administered 2021-04-02: 20 mg via ORAL

## 2021-04-02 MED ORDER — SUCCINYLCHOLINE CHLORIDE 200 MG/10ML IV SOSY
PREFILLED_SYRINGE | INTRAVENOUS | Status: AC
  Filled 2021-04-02: qty 10

## 2021-04-02 MED ORDER — ONDANSETRON HCL 4 MG/2ML IJ SOLN
INTRAMUSCULAR | Status: DC | PRN
  Administered 2021-04-02: 4 mg via INTRAVENOUS

## 2021-04-02 MED ORDER — FENTANYL CITRATE (PF) 100 MCG/2ML IJ SOLN: 25.0000 ug | INTRAMUSCULAR | Status: DC | PRN

## 2021-04-02 MED ORDER — MIDAZOLAM HCL 2 MG/2ML IJ SOLN
INTRAMUSCULAR | Status: AC
  Filled 2021-04-02: qty 2

## 2021-04-02 MED ORDER — ACETAMINOPHEN 10 MG/ML IV SOLN
INTRAVENOUS | Status: AC
  Filled 2021-04-02: qty 100

## 2021-04-02 MED ORDER — ROCURONIUM BROMIDE 10 MG/ML (PF) SYRINGE
PREFILLED_SYRINGE | INTRAVENOUS | Status: AC
  Filled 2021-04-02: qty 10

## 2021-04-02 MED ORDER — ACETAMINOPHEN 10 MG/ML IV SOLN: 1000.0000 mg | Freq: Once | INTRAVENOUS | Status: DC | PRN

## 2021-04-02 MED ORDER — SUGAMMADEX SODIUM 200 MG/2ML IV SOLN
INTRAVENOUS | Status: DC | PRN
  Administered 2021-04-02: 200 mg via INTRAVENOUS

## 2021-04-02 MED ORDER — OXYCODONE HCL 5 MG PO TABS: 5.0000 mg | ORAL_TABLET | Freq: Once | ORAL | Status: DC | PRN

## 2021-04-02 MED ORDER — ONDANSETRON HCL 4 MG/2ML IJ SOLN
INTRAMUSCULAR | Status: AC
  Filled 2021-04-02: qty 2

## 2021-04-02 MED ORDER — CHLORHEXIDINE GLUCONATE 0.12 % MT SOLN
OROMUCOSAL | Status: AC
  Filled 2021-04-02: qty 15

## 2021-04-02 MED ORDER — ROCURONIUM BROMIDE 100 MG/10ML IV SOLN
INTRAVENOUS | Status: DC | PRN
  Administered 2021-04-02: 40 mg via INTRAVENOUS
  Administered 2021-04-02: 10 mg via INTRAVENOUS

## 2021-04-02 MED ORDER — ORAL CARE MOUTH RINSE: 15.0000 mL | Freq: Once | OROMUCOSAL | Status: AC

## 2021-04-02 MED ORDER — CHLORHEXIDINE GLUCONATE 0.12 % MT SOLN
15.0000 mL | Freq: Once | OROMUCOSAL | Status: AC
  Administered 2021-04-02: 15 mL via OROMUCOSAL

## 2021-04-02 MED ORDER — ACETAMINOPHEN 10 MG/ML IV SOLN
INTRAVENOUS | Status: DC | PRN
  Administered 2021-04-02: 1000 mg via INTRAVENOUS

## 2021-04-02 MED ORDER — SODIUM CHLORIDE 0.9 % IR SOLN
Status: DC | PRN
  Administered 2021-04-02: 9000 mL

## 2021-04-02 MED ORDER — FENTANYL CITRATE (PF) 100 MCG/2ML IJ SOLN
INTRAMUSCULAR | Status: AC
  Filled 2021-04-02: qty 2

## 2021-04-02 MED ORDER — FENTANYL CITRATE (PF) 100 MCG/2ML IJ SOLN
INTRAMUSCULAR | Status: DC | PRN
  Administered 2021-04-02 (×2): 25 ug via INTRAVENOUS

## 2021-04-02 MED ORDER — ONDANSETRON HCL 4 MG/2ML IJ SOLN: 4.0000 mg | Freq: Once | INTRAMUSCULAR | Status: DC | PRN

## 2021-04-02 MED ORDER — EPHEDRINE SULFATE 50 MG/ML IJ SOLN
INTRAMUSCULAR | Status: DC | PRN
  Administered 2021-04-02: 5 mg via INTRAVENOUS
  Administered 2021-04-02: 10 mg via INTRAVENOUS

## 2021-04-02 MED ORDER — BELLADONNA ALKALOIDS-OPIUM 16.2-60 MG RE SUPP
RECTAL | Status: DC | PRN
  Administered 2021-04-02: 1 via RECTAL

## 2021-04-02 MED ORDER — PROPOFOL 10 MG/ML IV BOLUS
INTRAVENOUS | Status: DC | PRN
  Administered 2021-04-02: 150 mg via INTRAVENOUS

## 2021-04-02 SURGICAL SUPPLY — 37 items
ADAPTER IRRIG TUBE 2 SPIKE SOL (ADAPTER) ×4 IMPLANT
ADPR TBG 2 SPK PMP STRL ASCP (ADAPTER) ×2
BAG DRN LRG CPC RND TRDRP CNTR (MISCELLANEOUS) ×1
BAG URO DRAIN 4000ML (MISCELLANEOUS) ×2 IMPLANT
CATH FOLEY 3WAY 30CC 24FR (CATHETERS)
CATH URETL OPEN 5X70 (CATHETERS) ×2 IMPLANT
CATH URTH STD 24FR FL 3W 2 (CATHETERS) IMPLANT
CONTAINER COLLECT MORCELLATR (MISCELLANEOUS) ×1 IMPLANT
DRAPE UTILITY 15X26 TOWEL STRL (DRAPES) IMPLANT
ELECT BIVAP BIPO 22/24 DONUT (ELECTROSURGICAL)
ELECTRD BIVAP BIPO 22/24 DONUT (ELECTROSURGICAL) IMPLANT
FILTER OVERFLOW MORCELLATOR (FILTER) ×1 IMPLANT
GAUZE 4X4 16PLY ~~LOC~~+RFID DBL (SPONGE) ×2 IMPLANT
GLOVE SURG UNDER POLY LF SZ7.5 (GLOVE) ×2 IMPLANT
GOWN STRL REUS W/ TWL LRG LVL3 (GOWN DISPOSABLE) ×1 IMPLANT
GOWN STRL REUS W/ TWL XL LVL3 (GOWN DISPOSABLE) ×1 IMPLANT
GOWN STRL REUS W/TWL LRG LVL3 (GOWN DISPOSABLE) ×2
GOWN STRL REUS W/TWL XL LVL3 (GOWN DISPOSABLE) ×2
HOLDER FOLEY CATH W/STRAP (MISCELLANEOUS) IMPLANT
IV NS IRRIG 3000ML ARTHROMATIC (IV SOLUTION) ×18 IMPLANT
KIT TURNOVER CYSTO (KITS) ×2 IMPLANT
MANIFOLD NEPTUNE II (INSTRUMENTS) IMPLANT
MBRN O SEALING YLW 17 FOR INST (MISCELLANEOUS) ×2
MEMBRANE SLNG YLW 17 FOR INST (MISCELLANEOUS) ×1 IMPLANT
MORCELLATOR COLLECT CONTAINER (MISCELLANEOUS) ×2
MORCELLATOR OVERFLOW FILTER (FILTER) ×2
MORCELLATOR ROTATION 4.75 335 (MISCELLANEOUS) ×2 IMPLANT
PACK CYSTO AR (MISCELLANEOUS) ×2 IMPLANT
SET CYSTO W/LG BORE CLAMP LF (SET/KITS/TRAYS/PACK) ×2 IMPLANT
SET IRRIG Y TYPE TUR BLADDER L (SET/KITS/TRAYS/PACK) ×2 IMPLANT
SLEEVE PROTECTION STRL DISP (MISCELLANEOUS) ×4 IMPLANT
SURGILUBE 2OZ TUBE FLIPTOP (MISCELLANEOUS) ×2 IMPLANT
SYR TOOMEY IRRIG 70ML (MISCELLANEOUS) ×2
SYRINGE TOOMEY IRRIG 70ML (MISCELLANEOUS) ×1 IMPLANT
TUBE PUMP MORCELLATOR PIRANHA (TUBING) ×2 IMPLANT
WATER STERILE IRR 1000ML POUR (IV SOLUTION) ×2 IMPLANT
WATER STERILE IRR 500ML POUR (IV SOLUTION) ×2 IMPLANT

## 2021-04-02 NOTE — Op Note (Signed)
Date of procedure: 04/02/21  Preoperative diagnosis:  BPH with obstruction  Postoperative diagnosis:  Same  Procedure: HoLEP (Holmium Laser Enucleation of the Prostate)  Surgeon: Nickolas Madrid, MD  Anesthesia: General  Complications: None  Intraoperative findings:  Large prostate with high bladder neck, obstructing trilobar hyperplasia Ureteral orifices orthotopic bilaterally, no suspicious lesions Ureteral orifices and verumontanum intact at conclusion of case, excellent hemostasis  EBL: Minimal  Specimens: Prostate chips  Enucleation time: 43 minutes  Morcellation time: 10 minutes  Intra-op weight: 84 g  Drains: 24 French three-way, 60 cc in balloon  Indication: Paul Stokes is a 65 y.o. patient with BPH and obstructive symptoms as well as recurrent gross hematuria who opted for HOLEP.  After reviewing the management options for treatment, they elected to proceed with the above surgical procedure(s). We have discussed the potential benefits and risks of the procedure, side effects of the proposed treatment, the likelihood of the patient achieving the goals of the procedure, and any potential problems that might occur during the procedure or recuperation.  We specifically discussed the risks of bleeding, infection, hematuria and clot retention, need for additional procedures, possible overnight hospital stay, temporary urgency and incontinence, rare long-term incontinence, and retrograde ejaculation.  Informed consent has been obtained.   Description of procedure:  The patient was taken to the operating room and general anesthesia was induced.  The patient was placed in the dorsal lithotomy position, prepped and draped in the usual sterile fashion, and preoperative antibiotics(Ancef) were administered.  SCDs were placed for DVT prophylaxis.  A preoperative time-out was performed.   [name] sounds were used to gently dilated the urethra up to 48F. The 84 French continuous  flow resectoscope was inserted into the urethra using the visual obturator  The prostate was large with a very high bladder neck and obstructing trilobar hyperplasia. The bladder was thoroughly inspected and notable for mild to moderate trabeculations but no suspicious lesions.  The ureteral orifices were located in orthotopic position.  The laser was set to 2 J and 60 Hz and was used to make a lambda incision just proximal to the verumontanum down to the level of the capsule.  A 5 and 7 o'clock incisions were then made down to the level of the capsule from the bladder neck to the verumontanum, and the median lobe enucleated into the bladder.  The lateral lobes were then incised circumferentially until they were disconnected from the surrounding tissue.  The capsule was examined and laser was used for meticulous hemostasis.    The 76 French resectoscope was then switched out for the 33 French nephroscope and the lobes were morcellated and the tissue sent to pathology.  A 24 French three-way catheter was inserted easily with the aid of a catheter guide, and 60 cc were placed in the balloon.  Urine was clear.  The catheter irrigated easily with a Toomey syringe.  CBI was initiated. A belladonna suppository was placed.  The patient tolerated the procedure well without any immediate complications and was extubated and transferred to the recovery room in stable condition.  Urine was clear on fast CBI.  Disposition: Stable to PACU  Plan: Wean CBI in PACU, anticipate discharge home today with Foley removal in clinic in 2-3 days RTC with MD in 3 months with PVR   Nickolas Madrid, MD 04/02/2021

## 2021-04-02 NOTE — H&P (Signed)
   04/02/21 8:08 AM   Paul Stokes [birth year] [id number]  CC: BPH  HPI: 65 year old male with BPH and obstructive urinary symptoms as well as recurrent gross hematuria.  Prostate measures 128 g with significant intravesical protrusion on cystoscopy, no other etiology of gross hematuria on hematuria work-up.  He has opted for HOLEP.   PMH: Past Medical History:  Diagnosis Date   Depression    Diabetes mellitus without complication ([place])    type 1; insulin pump   Emphysema of lung (HCC)    GERD (gastroesophageal reflux disease)    Hyperlipidemia    Prostate disease     Surgical History: Past Surgical History:  Procedure Laterality Date   CATARACT EXTRACTION, BILATERAL     COLONOSCOPY  08/14/2013   7 tubular adenomas   COLONOSCOPY WITH PROPOFOL N/A 09/10/2019   Procedure: COLONOSCOPY WITH PROPOFOL;  Surgeon: [name], MD;  Location: [facility] ENDOSCOPY;  Service: Endoscopy;  Laterality: N/A;   COLONOSCOPY WITH PROPOFOL N/A 09/11/2019   Procedure: COLONOSCOPY WITH PROPOFOL;  Surgeon: Jonathon Bellows, MD;  Location: [facility] ENDOSCOPY;  Service: Gastroenterology;  Laterality: N/A;    Family History: Family History  Problem Relation Age of Onset   Diabetes Mother    Diabetes Father     Social History:  reports that he has been smoking cigarettes. He started smoking about 52 years ago. He has a 23.50 pack-year smoking history. He has never used smokeless tobacco. He reports that he does not drink alcohol and does not use drugs.  Physical Exam: BP 132/71   Pulse 82   Temp 97.8 F (36.6 C) (Temporal)   Resp 16   Ht 5\' 7"  (1.702 m)   Wt 79.4 kg   SpO2 95%   BMI 27.41 kg/m    Constitutional:  Alert and oriented, No acute distress. Cardiovascular: Regular rate and rhythm Respiratory: Clear to auscultation bilaterally GI: Abdomen is soft, nontender, nondistended, no abdominal masses   Laboratory Data: Urine culture 11/2 5k colonies staph epidermis, suspect  contaminant   Assessment & Plan:   65 year old male with BPH and obstructive urinary symptoms, 128 g prostate, and recurrent gross hematuria who has opted for HOLEP.  We discussed the risks and benefits of HoLEP at length.  The procedure requires general anesthesia and takes 1 to 2 hours, and a holmium laser is used to enucleate the prostate and push this tissue into the bladder.  A morcellator is then used to remove this tissue, which is sent for pathology.  The vast majority(>95%) of patients are able to discharge the same day with a catheter in place for 2 to 3 days, and will follow-up in clinic for a voiding trial.  We specifically discussed the risks of bleeding, infection, retrograde ejaculation, temporary urgency and urge incontinence, very low risk of long-term incontinence, urethral stricture/bladder neck contracture, pathologic evaluation of prostate tissue and possible detection of prostate cancer or other malignancy, and possible need for additional procedures.  HOLEP today  Nickolas Madrid, MD 04/02/2021  [facility] [address], [place], [place] [address] [phone]

## 2021-04-02 NOTE — Discharge Instructions (Addendum)
You underwent a HoLEP surgery (holmium laser enucleation of the prostate) for BPH and urinary symptoms.   It is normal to have blood in the catheter, and in the urine for a few days to even 2-3 weeks after surgery.  It is very important to drink plenty of fluids the first few days after surgery to keep the bladder flushed.  Blood is like food coloring and only takes a small amount of blood to make the urine and the catheter or urine look very bloody.   If you have fever over 101.3, or thick red clots like ketchup in the catheter tubing and lower abdominal pain with a non-draining catheter, please call the clinic or present to the ED.   After the Foley catheter is removed, it will be normal to have some burning with urination, urgency, frequency, and urinary incontinence(urine leakage).  This is expected after the procedure, and will continue to improve over the next few days to weeks.  It is okay to take Pyridium over-the-counter if you are having severe burning with urination after the catheter is removed.  In some patients, it can even take 2 to 3 months for the bladder to adjust after surgery, and it is common for patients to have some urinary urgency and urinary leakage during this time that will continue to improve.  Starting 1 week after surgery, you can start performing Kegel exercises to strengthen the sphincter and pelvic floor muscles.  A handout regarding how to perform these exercises will be given to you when your catheter is removed.   OK to resume aspirin in 3 days.   No strenuous activity or heavy lifting more than 20 pounds for 2 weeks.  Okay to shower with catheter in place, and swim or take baths once the catheter is removed.   If you have questions or concerns, please do not hesitate to call the urology clinic at [phone].   AMBULATORY SURGERY  DISCHARGE INSTRUCTIONS   The drugs that you were given will stay in your system until tomorrow so for the next 24 hours you should  not:  Drive an automobile Make any legal decisions Drink any alcoholic beverage   You may resume regular meals tomorrow.  Today it is better to start with liquids and gradually work up to solid foods.  You may eat anything you prefer, but it is better to start with liquids, then soup and crackers, and gradually work up to solid foods.   Please notify your doctor immediately if you have any unusual bleeding, trouble breathing, redness and pain at the surgery site, drainage, fever, or pain not relieved by medication.    Your post-operative visit with Dr.                                       is: Date:                        Time:    Please call to schedule your post-operative visit.  Additional Instructions:

## 2021-04-02 NOTE — Anesthesia Postprocedure Evaluation (Signed)
Anesthesia Post Note  Patient: Paul Stokes  Procedure(s) Performed: HOLEP-LASER ENUCLEATION OF THE PROSTATE WITH MORCELLATION  Patient location during evaluation: PACU Anesthesia Type: General Level of consciousness: awake and alert, awake and oriented Pain management: pain level controlled Vital Signs Assessment: post-procedure vital signs reviewed and stable Respiratory status: spontaneous breathing, nonlabored ventilation and respiratory function stable Cardiovascular status: blood pressure returned to baseline and stable Postop Assessment: no apparent nausea or vomiting Anesthetic complications: no   No notable events documented.   Last Vitals:  Vitals:   04/02/21 1045 04/02/21 1105  BP: 140/72 (!) 146/67  Pulse: 75 73  Resp: 15 16  Temp: (!) 36.1 C (!) 36.4 C  SpO2: 95% 99%    Last Pain:  Vitals:   04/02/21 1105  TempSrc: Oral  PainSc: 0-No pain                 Phill Mutter

## 2021-04-02 NOTE — Transfer of Care (Signed)
Immediate Anesthesia Transfer of Care Note  Patient: Paul Stokes  Procedure(s) Performed: HOLEP-LASER ENUCLEATION OF THE PROSTATE WITH MORCELLATION  Patient Location: PACU  Anesthesia Type:General  Level of Consciousness: drowsy  Airway & Oxygen Therapy: Patient Spontanous Breathing and Patient connected to face mask oxygen  Post-op Assessment: Report given to RN and Post -op Vital signs reviewed and stable  Post vital signs: Reviewed and stable  Last Vitals:  Vitals Value Taken Time  BP 142/87 04/02/21 0951  Temp 36 C 04/02/21 0951  Pulse 75 04/02/21 1000  Resp 13 04/02/21 1000  SpO2 100 % 04/02/21 1000  Vitals shown include unvalidated device data.  Last Pain:  Vitals:   04/02/21 0733  TempSrc: Temporal  PainSc: 0-No pain         Complications: No notable events documented.

## 2021-04-02 NOTE — Anesthesia Procedure Notes (Signed)
Procedure Name: Intubation Date/Time: 04/02/2021 8:26 AM Performed by: Aline Brochure, CRNA Pre-anesthesia Checklist: Patient identified, Patient being monitored, Timeout performed, Emergency Drugs available and Suction available Patient Re-evaluated:Patient Re-evaluated prior to induction Oxygen Delivery Method: Circle system utilized Preoxygenation: Pre-oxygenation with 100% oxygen Induction Type: IV induction Ventilation: Mask ventilation without difficulty Laryngoscope Size: McGraph and 4 Grade View: Grade I Tube type: Oral Tube size: 7.5 mm Number of attempts: 1 Airway Equipment and Method: Stylet and Video-laryngoscopy Placement Confirmation: ETT inserted through vocal cords under direct vision, positive ETCO2 and breath sounds checked- equal and bilateral Secured at: 22 cm Tube secured with: Tape Dental Injury: Teeth and Oropharynx as per pre-operative assessment

## 2021-04-02 NOTE — Anesthesia Preprocedure Evaluation (Addendum)
Anesthesia Evaluation  Patient identified by MRN, date of birth, ID band Patient awake    Reviewed: Allergy & Precautions, NPO status , Patient's Chart, lab work & pertinent test results  History of Anesthesia Complications Negative for: history of anesthetic complications  Airway Mallampati: IV   Neck ROM: Full    Dental  (+) Partial Lower, Partial Upper   Pulmonary COPD, Current Smoker (1/2 ppd) and Patient abstained from smoking.,    Pulmonary exam normal breath sounds clear to auscultation       Cardiovascular Exercise Tolerance: Good Normal cardiovascular exam Rhythm:Regular Rate:Normal  ECG 03/30/21:  Normal sinus rhythm Rightward axis   Neuro/Psych PSYCHIATRIC DISORDERS Depression negative neurological ROS     GI/Hepatic GERD  ,  Endo/Other  diabetes, Type 1, Insulin Dependent  Renal/GU negative Renal ROS   BPH    Musculoskeletal   Abdominal   Peds  Hematology negative hematology ROS (+)   Anesthesia Other Findings   Reproductive/Obstetrics                            Anesthesia Physical Anesthesia Plan  ASA: 3  Anesthesia Plan: General   Post-op Pain Management:    Induction: Intravenous  PONV Risk Score and Plan: 1 and Ondansetron, Dexamethasone and Treatment may vary due to age or medical condition  Airway Management Planned: Oral ETT  Additional Equipment:   Intra-op Plan:   Post-operative Plan: Extubation in OR  Informed Consent: I have reviewed the patients History and Physical, chart, labs and discussed the procedure including the risks, benefits and alternatives for the proposed anesthesia with the patient or authorized representative who has indicated his/her understanding and acceptance.     Dental advisory given  Plan Discussed with: CRNA  Anesthesia Plan Comments: (Patient consented for risks of anesthesia including but not limited to:  - adverse  reactions to medications - damage to eyes, teeth, lips or other oral mucosa - nerve damage due to positioning  - sore throat or hoarseness - damage to heart, brain, nerves, lungs, other parts of body or loss of life  Informed patient about role of CRNA in peri- and intra-operative care.  Patient voiced understanding.)        Anesthesia Quick Evaluation

## 2021-04-05 ENCOUNTER — Encounter: Payer: Self-pay | Admitting: Urology

## 2021-04-05 ENCOUNTER — Other Ambulatory Visit: Payer: Self-pay

## 2021-04-05 ENCOUNTER — Ambulatory Visit: Payer: Medicare Other | Admitting: Physician Assistant

## 2021-04-05 DIAGNOSIS — N138 Other obstructive and reflux uropathy: Secondary | ICD-10-CM

## 2021-04-05 DIAGNOSIS — N401 Enlarged prostate with lower urinary tract symptoms: Secondary | ICD-10-CM

## 2021-04-05 LAB — SURGICAL PATHOLOGY

## 2021-04-05 NOTE — Patient Instructions (Signed)

## 2021-04-05 NOTE — Progress Notes (Signed)
Catheter Removal  Patient is present today for a catheter removal.  21ml of water was drained from the balloon. A 24FR three-way foley cath was removed from the bladder no complications were noted . Patient tolerated well.  Performed by: Debroah Loop, PA-C   Additional notes: We discussed normal postoperative findings today including dysuria, gross hematuria, and urinary urgency/leakage.  Counseled the patient to wear absorbent underwear as needed for security and start Kegel exercises 3x10 sets daily to increase urinary control.  Surgical pathology pending, will defer to Dr. Diamantina Providence to share results when available.  Follow up: 10 week postop f/u with Dr. Diamantina Providence as scheduled

## 2021-04-06 ENCOUNTER — Telehealth: Payer: Self-pay

## 2021-04-06 NOTE — Telephone Encounter (Signed)
-----   Message from Billey Co, MD sent at 04/06/2021  8:04 AM EST ----- Good news, only benign prostate on HOLEP tissue, keep follow up as scheduled  Nickolas Madrid, MD 04/06/2021

## 2021-04-06 NOTE — Telephone Encounter (Signed)
Called pt no answer. Left detailed message for pt per DPR. Advised pt to call back for questions or concerns.  

## 2021-04-14 ENCOUNTER — Ambulatory Visit (INDEPENDENT_AMBULATORY_CARE_PROVIDER_SITE_OTHER): Payer: Medicare Other

## 2021-04-14 ENCOUNTER — Ambulatory Visit: Payer: Medicare Other | Admitting: Podiatry

## 2021-04-14 ENCOUNTER — Other Ambulatory Visit: Payer: Self-pay

## 2021-04-14 ENCOUNTER — Encounter: Payer: Self-pay | Admitting: Podiatry

## 2021-04-14 DIAGNOSIS — M722 Plantar fascial fibromatosis: Secondary | ICD-10-CM

## 2021-04-14 MED ORDER — TRIAMCINOLONE ACETONIDE 40 MG/ML IJ SUSP
20.0000 mg | Freq: Once | INTRAMUSCULAR | Status: AC
Start: 2021-04-14 — End: 2021-04-14
  Administered 2021-04-14: 20 mg

## 2021-04-14 NOTE — Patient Instructions (Signed)

## 2021-04-14 NOTE — Progress Notes (Signed)
Subjective:  Patient ID: Paul Stokes, male    DOB: 1955/06/17,  MRN: [id number] HPI Chief Complaint  Patient presents with   Foot Pain    Plantar/medial heel left - aching x 1 year, worsened recently, AM pain, takes gabapentin 1800mg  daily   Diabetes    Last a1c was 8.3   New Patient (Initial Visit)    65 y.o. male presents with the above complaint.   ROS: Denies fever chills nausea vomiting muscle aches pains calf pain back pain chest pain shortness of breath.  Past Medical History:  Diagnosis Date   Depression    Diabetes mellitus without complication ([place])    type 1; insulin pump   Emphysema of lung (HCC)    GERD (gastroesophageal reflux disease)    Hyperlipidemia    Prostate disease    Past Surgical History:  Procedure Laterality Date   CATARACT EXTRACTION, BILATERAL     COLONOSCOPY  08/14/2013   7 tubular adenomas   COLONOSCOPY WITH PROPOFOL N/A 09/10/2019   Procedure: COLONOSCOPY WITH PROPOFOL;  Surgeon: [name], MD;  Location: [facility] ENDOSCOPY;  Service: Endoscopy;  Laterality: N/A;   COLONOSCOPY WITH PROPOFOL N/A 09/11/2019   Procedure: COLONOSCOPY WITH PROPOFOL;  Surgeon: Jonathon Bellows, MD;  Location: [facility] ENDOSCOPY;  Service: Gastroenterology;  Laterality: N/A;   HOLEP-LASER ENUCLEATION OF THE PROSTATE WITH MORCELLATION N/A 04/02/2021   Procedure: HOLEP-LASER ENUCLEATION OF THE PROSTATE WITH MORCELLATION;  Surgeon: Billey Co, MD;  Location: [facility] ORS;  Service: Urology;  Laterality: N/A;    Current Outpatient Medications:    albuterol (VENTOLIN HFA) 108 (90 Base) MCG/ACT inhaler, Inhale 2 puffs into the lungs every 6 (six) hours as needed for wheezing or shortness of breath., Disp: 18 g, Rfl: 0   aspirin 81 MG tablet, Take 1 tablet by mouth daily., Disp: , Rfl:    BAYER CONTOUR NEXT TEST test strip, , Disp: , Rfl: 1   buPROPion (WELLBUTRIN SR) 150 MG 12 hr tablet, TAKE 1 TABLET BY MOUTH EVERY MORNING AND2 TABLETS AT BEDTIME, Disp: 270 tablet, Rfl: 1    Continuous Blood Gluc Sensor (DEXCOM G6 SENSOR) MISC, by Does not apply route., Disp: , Rfl:    fluticasone-salmeterol (ADVAIR HFA) 45-21 MCG/ACT inhaler, Inhale 2 puffs into the lungs 2 (two) times daily. (Patient taking differently: Inhale 2 puffs into the lungs daily.), Disp: 1 each, Rfl: 5   gabapentin (NEURONTIN) 300 MG capsule, Take 2 capsules (600 mg total) by mouth 3 (three) times daily., Disp: 540 capsule, Rfl: 3   HUMALOG 100 UNIT/ML injection, Inject 80 Units into the skin See admin instructions. 80 units per day through insulin pump, Disp: , Rfl:    pravastatin (PRAVACHOL) 40 MG tablet, TAKE 1 TABLET BY MOUTH DAILY, Disp: 90 tablet, Rfl: 1   vitamin B-12 (CYANOCOBALAMIN) 500 MCG tablet, Take 500 mcg by mouth daily., Disp: , Rfl:    VITAMIN D PO, Take 5,000 Units by mouth daily., Disp: , Rfl:    zolpidem (AMBIEN) 10 MG tablet, Take 1 tablet (10 mg total) by mouth at bedtime., Disp: 30 tablet, Rfl: 5  Allergies  Allergen Reactions   Lisinopril Cough   Review of Systems Objective:  There were no vitals filed for this visit.  General: Well developed, nourished, in no acute distress, alert and oriented x3   Dermatological: Skin is warm, dry and supple bilateral. Nails x 10 are well maintained; remaining integument appears unremarkable at this time. There are no open sores, no preulcerative lesions, no  rash or signs of infection present.  Vascular: Dorsalis Pedis artery and Posterior Tibial artery pedal pulses are 2/4 bilateral with immedate capillary fill time. Pedal hair growth present. No varicosities and no lower extremity edema present bilateral.   Neruologic: Grossly intact via light touch bilateral. Vibratory intact via tuning fork bilateral. Protective threshold with Semmes Wienstein monofilament intact to all pedal sites bilateral. Patellar and Achilles deep tendon reflexes 2+ bilateral. No Babinski or clonus noted bilateral.   Musculoskeletal: No gross boney pedal deformities  bilateral. No pain, crepitus, or limitation noted with foot and ankle range of motion bilateral. Muscular strength 5/5 in all groups tested bilateral.  Pain on palpation medial calcaneal tubercle left heel.  No pain on medial lateral compression of the calcaneus.  Gait: Unassisted, Nonantalgic.    Radiographs:  Radiographs taken today demonstrate osseously mature individual soft tissue increase in density piperocaine insertion site is evident.  Assessment & Plan:   Assessment: Planter fasciitis left foot  Plan: Discussed etiology pathology and surgical therapies at this point I injected his heel today 20 mg Kenalog 5 mg Marcaine to the point of maximal tenderness.  Placed in a plantar fascial brace discussed appropriate shoe gear stretching exercise ice therapy sugar modifications started him on a One-A-Day anti-inflammatory.     Lev Cervone T. [place], [place]

## 2021-04-17 DIAGNOSIS — E1165 Type 2 diabetes mellitus with hyperglycemia: Secondary | ICD-10-CM | POA: Diagnosis not present

## 2021-04-23 ENCOUNTER — Telehealth: Payer: Self-pay

## 2021-04-23 ENCOUNTER — Encounter: Payer: Self-pay | Admitting: Internal Medicine

## 2021-04-23 ENCOUNTER — Telehealth (INDEPENDENT_AMBULATORY_CARE_PROVIDER_SITE_OTHER): Payer: Medicare Other | Admitting: Internal Medicine

## 2021-04-23 VITALS — Temp 99.0°F | Ht 67.0 in

## 2021-04-23 DIAGNOSIS — U071 COVID-19: Secondary | ICD-10-CM | POA: Diagnosis not present

## 2021-04-23 MED ORDER — PROMETHAZINE-DM 6.25-15 MG/5ML PO SYRP: 5.0000 mL | ORAL_SOLUTION | Freq: Four times a day (QID) | ORAL | 0 refills | Status: DC | PRN

## 2021-04-23 MED ORDER — MOLNUPIRAVIR EUA 200MG CAPSULE: 4.0000 | ORAL_CAPSULE | Freq: Two times a day (BID) | ORAL | 0 refills | Status: AC

## 2021-04-23 NOTE — Telephone Encounter (Signed)
Copied from [address] [id number]. Topic: Quick Communication - Rx Refill/Question >> Apr 23, 2021  3:20 PM Erick Blinks wrote: Pt's wife has tested positive for Covid, she just had a visit with Dr. Army Melia. Pt's wife wants to know if PCP would also prescribe the patient the same medication because he just tested positive for covid just the same. Pt has symptoms, please advise   [facility]

## 2021-04-23 NOTE — Progress Notes (Signed)
Date:  04/23/2021   Name:  Paul Stokes   DOB:  1955/09/28   MRN:  [id number]  This encounter was conducted via video encounter due to the need for social distancing in light of the Covid-19 pandemic.  The patient was correctly identified.  I advised that I am conducting the visit from a secure room in my office at [facility] clinic.  The patient is located at home. The limitations of this form of encounter were discussed with the patient and he/she agreed to proceed.  Some vital signs will be absent.  Chief Complaint: Covid Positive (Today with home test )  URI  This is a new problem. Episode onset: 3 days. The problem has been gradually worsening. Maximum temperature: 99.0. The fever has been present for Less than 1 day. Associated symptoms include congestion, coughing, headaches, rhinorrhea, a sore throat and wheezing. Pertinent negatives include no diarrhea or nausea. He has tried nothing for the symptoms.   Lab Results  Component Value Date   NA 136 03/30/2021   K 4.1 03/30/2021   CO2 25 03/30/2021   GLUCOSE 144 (H) 03/30/2021   BUN 20 03/30/2021   CREATININE 0.99 03/30/2021   CALCIUM 8.9 03/30/2021   EGFR 77 08/06/2020   GFRNONAA >60 03/30/2021   Lab Results  Component Value Date   CHOL 121 08/06/2020   HDL 44 08/06/2020   LDLCALC 59 08/06/2020   TRIG 97 08/06/2020   CHOLHDL 2.8 08/06/2020   Lab Results  Component Value Date   TSH 1.510 08/06/2020   Lab Results  Component Value Date   HGBA1C 8.3 07/28/2020   Lab Results  Component Value Date   WBC 7.1 03/17/2021   HGB 13.7 03/17/2021   HCT 41.9 03/17/2021   MCV 90 03/17/2021   PLT 362 03/17/2021   Lab Results  Component Value Date   ALT 16 08/06/2020   AST 19 08/06/2020   ALKPHOS 106 08/06/2020   BILITOT 0.4 08/06/2020   No results found for: 25OHVITD2, 25OHVITD3, VD25OH   Review of Systems  Constitutional:  Positive for chills and fever. Negative for diaphoresis and unexpected weight change.   HENT:  Positive for congestion, rhinorrhea and sore throat.   Respiratory:  Positive for cough, chest tightness and wheezing. Negative for shortness of breath.   Gastrointestinal:  Negative for diarrhea and nausea.  Neurological:  Positive for headaches.   Patient Active Problem List   Diagnosis Date Noted   History of gross hematuria 01/05/2021   Centrilobular emphysema (HCC) 12/11/2019   Personal history of colonic polyps    Aortic atherosclerosis (HCC) 08/27/2019   Dyspepsia 08/06/2018   Acute bilateral low back pain with right-sided sciatica 11/29/2017   Mood disorder (HCC) 11/15/2016   BPH with obstruction/lower urinary tract symptoms 06/15/2015   Hyperlipidemia due to type 1 diabetes mellitus (HCC) 06/15/2015   Insomnia, persistent 03/06/2015   Compulsive tobacco user syndrome 03/06/2015   Type 1 diabetes mellitus with sensory neuropathy (HCC) 03/06/2015   Presence of insulin pump 06/08/2014    Allergies  Allergen Reactions   Lisinopril Cough    Past Surgical History:  Procedure Laterality Date   CATARACT EXTRACTION, BILATERAL     COLONOSCOPY  08/14/2013   7 tubular adenomas   COLONOSCOPY WITH PROPOFOL N/A 09/10/2019   Procedure: COLONOSCOPY WITH PROPOFOL;  Surgeon: Midge Minium, MD;  Location: [facility] ENDOSCOPY;  Service: Endoscopy;  Laterality: N/A;   COLONOSCOPY WITH PROPOFOL N/A 09/11/2019   Procedure: COLONOSCOPY WITH PROPOFOL;  Surgeon: Jonathon Bellows, MD;  Location: [facility] ENDOSCOPY;  Service: Gastroenterology;  Laterality: N/A;   HOLEP-LASER ENUCLEATION OF THE PROSTATE WITH MORCELLATION N/A 04/02/2021   Procedure: HOLEP-LASER ENUCLEATION OF THE PROSTATE WITH MORCELLATION;  Surgeon: Billey Co, MD;  Location: [facility] ORS;  Service: Urology;  Laterality: N/A;    Social History   Tobacco Use   Smoking status: Every Day    Packs/day: 0.50    Years: 47.00    Pack years: 23.50    Types: Cigarettes    Start date: 05/12/1968   Smokeless tobacco: Never  Vaping Use    Vaping Use: Never used  Substance Use Topics   Alcohol use: No    Alcohol/week: 0.0 standard drinks   Drug use: No     Medication list has been reviewed and updated.  Current Meds  Medication Sig   albuterol (VENTOLIN HFA) 108 (90 Base) MCG/ACT inhaler Inhale 2 puffs into the lungs every 6 (six) hours as needed for wheezing or shortness of breath.   BAYER CONTOUR NEXT TEST test strip    buPROPion (WELLBUTRIN SR) 150 MG 12 hr tablet TAKE 1 TABLET BY MOUTH EVERY MORNING AND2 TABLETS AT BEDTIME   Continuous Blood Gluc Sensor (DEXCOM G6 SENSOR) MISC by Does not apply route.   fluticasone-salmeterol (ADVAIR HFA) 45-21 MCG/ACT inhaler Inhale 2 puffs into the lungs 2 (two) times daily. (Patient taking differently: Inhale 2 puffs into the lungs daily.)   gabapentin (NEURONTIN) 300 MG capsule Take 2 capsules (600 mg total) by mouth 3 (three) times daily.   HUMALOG 100 UNIT/ML injection Inject 80 Units into the skin See admin instructions. 80 units per day through insulin pump   molnupiravir EUA (LAGEVRIO) 200 mg CAPS capsule Take 4 capsules (800 mg total) by mouth 2 (two) times daily for 5 days.   pravastatin (PRAVACHOL) 40 MG tablet TAKE 1 TABLET BY MOUTH DAILY   promethazine-dextromethorphan (PROMETHAZINE-DM) 6.25-15 MG/5ML syrup Take 5 mLs by mouth 4 (four) times daily as needed for cough.   vitamin B-12 (CYANOCOBALAMIN) 500 MCG tablet Take 500 mcg by mouth daily.   VITAMIN D PO Take 5,000 Units by mouth daily.   zolpidem (AMBIEN) 10 MG tablet Take 1 tablet (10 mg total) by mouth at bedtime.    PHQ 2/9 Scores 04/23/2021 01/05/2021 12/30/2020 08/06/2020  PHQ - 2 Score 0 0 1 0  PHQ- 9 Score $Remov'2 5 4 1    'sWhzzg$ GAD 7 : Generalized Anxiety Score 04/23/2021 01/05/2021 08/06/2020 11/15/2017  Nervous, Anxious, on Edge 0 0 0 1  Control/stop worrying 0 0 0 0  Worry too much - different things 0 0 0 1  Trouble relaxing 0 0 0 1  Restless 0 1 0 0  Easily annoyed or irritable 0 1 0 3  Afraid - awful might  happen 0 0 0 0  Total GAD 7 Score 0 2 0 6  Anxiety Difficulty Not difficult at all - - Not difficult at all    BP Readings from Last 3 Encounters:  04/02/21 (!) 146/67  03/30/21 (!) 144/66  03/17/21 124/76    Physical Exam Constitutional:      Appearance: Normal appearance.  Pulmonary:     Effort: Pulmonary effort is normal.     Comments: No dyspnea noted; occasional dry cough Neurological:     Mental Status: He is alert.    Wt Readings from Last 3 Encounters:  04/02/21 175 lb (79.4 kg)  03/30/21 175 lb (79.4 kg)  03/17/21 172 lb (  78 kg)    Temp 99 F (37.2 C) (Oral)   Ht $R'5\' 7"'td$  (1.702 m)   SpO2 97%   BMI 27.41 kg/m   Assessment and Plan: 1. COVID-19 virus infection Tylenol as needed for fever; fluids, rest Quarantine for 5 days Monitor BS closely for rapid fluctuations Use Albuterol MDI PRN Seek in person care for SOB at rest or high fever that does not respond to tylenol - molnupiravir EUA (LAGEVRIO) 200 mg CAPS capsule; Take 4 capsules (800 mg total) by mouth 2 (two) times daily for 5 days.  Dispense: 40 capsule; Refill: 0 - promethazine-dextromethorphan (PROMETHAZINE-DM) 6.25-15 MG/5ML syrup; Take 5 mLs by mouth 4 (four) times daily as needed for cough.  Dispense: 180 mL; Refill: 0  I spent 7 minutes on this encounter. Partially dictated using Editor, commissioning. Any errors are unintentional.  Halina Maidens, MD [facility]  04/23/2021

## 2021-04-23 NOTE — Telephone Encounter (Signed)
Pt sent mychart message as well. Will respond thru mychart.  KP

## 2021-05-12 ENCOUNTER — Other Ambulatory Visit
Admission: RE | Admit: 2021-05-12 | Discharge: 2021-05-12 | Disposition: A | Discharge status: Home / Self Care | Payer: Medicare Other | Source: Home / Self Care | Attending: Internal Medicine | Admitting: Internal Medicine

## 2021-05-12 ENCOUNTER — Other Ambulatory Visit: Payer: Self-pay

## 2021-05-12 ENCOUNTER — Encounter: Payer: Self-pay | Admitting: Internal Medicine

## 2021-05-12 ENCOUNTER — Ambulatory Visit
Admission: RE | Admit: 2021-05-12 | Discharge: 2021-05-12 | Disposition: A | Discharge status: Home / Self Care | Payer: Medicare Other | Source: Ambulatory Visit | Attending: Internal Medicine | Admitting: Internal Medicine

## 2021-05-12 ENCOUNTER — Ambulatory Visit
Admission: RE | Admit: 2021-05-12 | Discharge: 2021-05-12 | Disposition: A | Discharge status: Home / Self Care | Payer: Medicare Other | Attending: Internal Medicine | Admitting: Internal Medicine

## 2021-05-12 ENCOUNTER — Ambulatory Visit (INDEPENDENT_AMBULATORY_CARE_PROVIDER_SITE_OTHER): Payer: Medicare Other | Admitting: Internal Medicine

## 2021-05-12 VITALS — BP 122/78 | HR 92 | Temp 98.3°F | Ht 67.0 in | Wt 180.6 lb

## 2021-05-12 DIAGNOSIS — J189 Pneumonia, unspecified organism: Secondary | ICD-10-CM | POA: Diagnosis present

## 2021-05-12 LAB — CBC WITH DIFFERENTIAL/PLATELET
Abs Immature Granulocytes: 0.13 10*3/uL — ABNORMAL HIGH (ref 0.00–0.07)
Basophils Absolute: 0.2 10*3/uL — ABNORMAL HIGH (ref 0.0–0.1)
Basophils Relative: 1 %
Eosinophils Absolute: 0.4 10*3/uL (ref 0.0–0.5)
Eosinophils Relative: 3 %
HCT: 42.3 % (ref 39.0–52.0)
Hemoglobin: 13.3 g/dL (ref 13.0–17.0)
Immature Granulocytes: 1 %
Lymphocytes Relative: 23 %
Lymphs Abs: 2.9 10*3/uL (ref 0.7–4.0)
MCH: 28.6 pg (ref 26.0–34.0)
MCHC: 31.4 g/dL (ref 30.0–36.0)
MCV: 91 fL (ref 80.0–100.0)
Monocytes Absolute: 1.3 10*3/uL — ABNORMAL HIGH (ref 0.1–1.0)
Monocytes Relative: 10 %
Neutro Abs: 8 10*3/uL — ABNORMAL HIGH (ref 1.7–7.7)
Neutrophils Relative %: 62 %
Platelets: 393 10*3/uL (ref 150–400)
RBC: 4.65 MIL/uL (ref 4.22–5.81)
RDW: 12.1 % (ref 11.5–15.5)
WBC: 12.9 10*3/uL — ABNORMAL HIGH (ref 4.0–10.5)
nRBC: 0 % (ref 0.0–0.2)

## 2021-05-12 MED ORDER — CEFDINIR 300 MG PO CAPS: 300.0000 mg | ORAL_CAPSULE | Freq: Two times a day (BID) | ORAL | 0 refills | Status: AC

## 2021-05-12 NOTE — Progress Notes (Signed)
Date:  05/12/2021   Name:  Paul Stokes   DOB:  08-25-1955   MRN:  [id number]   Chief Complaint: Cough  Cough This is a recurrent (Covid infection 3 weeks ago) problem. The current episode started 1 to 4 weeks ago. The problem has been waxing and waning. The cough is Productive of sputum. Associated symptoms include a fever, headaches, myalgias and wheezing. Pertinent negatives include no chest pain, chills, ear pain, sore throat or shortness of breath. He has tried prescription cough suppressant for the symptoms. There is no history of environmental allergies.   Lab Results  Component Value Date   NA 136 03/30/2021   K 4.1 03/30/2021   CO2 25 03/30/2021   GLUCOSE 144 (H) 03/30/2021   BUN 20 03/30/2021   CREATININE 0.99 03/30/2021   CALCIUM 8.9 03/30/2021   EGFR 77 08/06/2020   GFRNONAA >60 03/30/2021   Lab Results  Component Value Date   CHOL 121 08/06/2020   HDL 44 08/06/2020   LDLCALC 59 08/06/2020   TRIG 97 08/06/2020   CHOLHDL 2.8 08/06/2020   Lab Results  Component Value Date   TSH 1.510 08/06/2020   Lab Results  Component Value Date   HGBA1C 9.8 02/05/2021   Lab Results  Component Value Date   WBC 7.1 03/17/2021   HGB 13.7 03/17/2021   HCT 41.9 03/17/2021   MCV 90 03/17/2021   PLT 362 03/17/2021   Lab Results  Component Value Date   ALT 16 08/06/2020   AST 19 08/06/2020   ALKPHOS 106 08/06/2020   BILITOT 0.4 08/06/2020   No results found for: 25OHVITD2, 25OHVITD3, VD25OH   Review of Systems  Constitutional:  Positive for fatigue and fever. Negative for chills.  HENT:  Negative for ear pain, sore throat and trouble swallowing.   Eyes:  Negative for visual disturbance.  Respiratory:  Positive for cough and wheezing. Negative for shortness of breath.   Cardiovascular:  Negative for chest pain, palpitations and leg swelling.  Gastrointestinal:  Negative for constipation, diarrhea and vomiting.  Musculoskeletal:  Positive for myalgias.   Allergic/Immunologic: Negative for environmental allergies.  Neurological:  Positive for headaches.  Psychiatric/Behavioral:  Negative for sleep disturbance. The patient is not nervous/anxious.    Patient Active Problem List   Diagnosis Date Noted   History of gross hematuria 01/05/2021   Centrilobular emphysema (HCC) 12/11/2019   Personal history of colonic polyps    Aortic atherosclerosis (HCC) 08/27/2019   Dyspepsia 08/06/2018   Acute bilateral low back pain with right-sided sciatica 11/29/2017   Mood disorder (HCC) 11/15/2016   BPH with obstruction/lower urinary tract symptoms 06/15/2015   Hyperlipidemia due to type 1 diabetes mellitus (HCC) 06/15/2015   Insomnia, persistent 03/06/2015   Compulsive tobacco user syndrome 03/06/2015   Type 1 diabetes mellitus with sensory neuropathy (HCC) 03/06/2015   Presence of insulin pump 06/08/2014    Allergies  Allergen Reactions   Lisinopril Cough    Past Surgical History:  Procedure Laterality Date   CATARACT EXTRACTION, BILATERAL     COLONOSCOPY  08/14/2013   7 tubular adenomas   COLONOSCOPY WITH PROPOFOL N/A 09/10/2019   Procedure: COLONOSCOPY WITH PROPOFOL;  Surgeon: Midge Minium, MD;  Location: [facility] ENDOSCOPY;  Service: Endoscopy;  Laterality: N/A;   COLONOSCOPY WITH PROPOFOL N/A 09/11/2019   Procedure: COLONOSCOPY WITH PROPOFOL;  Surgeon: Wyline Mood, MD;  Location: [facility] ENDOSCOPY;  Service: Gastroenterology;  Laterality: N/A;   HOLEP-LASER ENUCLEATION OF THE PROSTATE WITH MORCELLATION N/A 04/02/2021  Procedure: HOLEP-LASER ENUCLEATION OF THE PROSTATE WITH MORCELLATION;  Surgeon: Billey Co, MD;  Location: [facility] ORS;  Service: Urology;  Laterality: N/A;    Social History   Tobacco Use   Smoking status: Every Day    Packs/day: 0.50    Years: 47.00    Pack years: 23.50    Types: Cigarettes    Start date: 05/12/1968   Smokeless tobacco: Never  Vaping Use   Vaping Use: Never used  Substance Use Topics   Alcohol  use: No    Alcohol/week: 0.0 standard drinks   Drug use: No     Medication list has been reviewed and updated.  Current Meds  Medication Sig   albuterol (VENTOLIN HFA) 108 (90 Base) MCG/ACT inhaler Inhale 2 puffs into the lungs every 6 (six) hours as needed for wheezing or shortness of breath.   BAYER CONTOUR NEXT TEST test strip    buPROPion (WELLBUTRIN SR) 150 MG 12 hr tablet TAKE 1 TABLET BY MOUTH EVERY MORNING AND2 TABLETS AT BEDTIME   Continuous Blood Gluc Sensor (DEXCOM G6 SENSOR) MISC by Does not apply route.   fluticasone-salmeterol (ADVAIR HFA) 45-21 MCG/ACT inhaler Inhale 2 puffs into the lungs 2 (two) times daily. (Patient taking differently: Inhale 2 puffs into the lungs daily.)   gabapentin (NEURONTIN) 300 MG capsule Take 2 capsules (600 mg total) by mouth 3 (three) times daily.   HUMALOG 100 UNIT/ML injection Inject 80 Units into the skin See admin instructions. 80 units per day through insulin pump   pravastatin (PRAVACHOL) 40 MG tablet TAKE 1 TABLET BY MOUTH DAILY   vitamin B-12 (CYANOCOBALAMIN) 500 MCG tablet Take 500 mcg by mouth daily.   VITAMIN D PO Take 5,000 Units by mouth daily.   zolpidem (AMBIEN) 10 MG tablet Take 1 tablet (10 mg total) by mouth at bedtime.    PHQ 2/9 Scores 05/12/2021 04/23/2021 01/05/2021 12/30/2020  PHQ - 2 Score 4 0 0 1  PHQ- 9 Score $Remov'15 2 5 4    'WnKUqm$ GAD 7 : Generalized Anxiety Score 05/12/2021 04/23/2021 01/05/2021 08/06/2020  Nervous, Anxious, on Edge 0 0 0 0  Control/stop worrying 0 0 0 0  Worry too much - different things 0 0 0 0  Trouble relaxing 2 0 0 0  Restless 0 0 1 0  Easily annoyed or irritable 0 0 1 0  Afraid - awful might happen 0 0 0 0  Total GAD 7 Score 2 0 2 0  Anxiety Difficulty Somewhat difficult Not difficult at all - -    BP Readings from Last 3 Encounters:  05/12/21 122/78  04/02/21 (!) 146/67  03/30/21 (!) 144/66    Physical Exam Constitutional:      Appearance: He is ill-appearing.  HENT:     Right Ear:  Tympanic membrane and ear canal normal.     Left Ear: Tympanic membrane and ear canal normal.  Neck:     Vascular: No carotid bruit.  Cardiovascular:     Rate and Rhythm: Normal rate and regular rhythm.     Pulses: Normal pulses.     Heart sounds: Normal heart sounds.  Pulmonary:     Effort: Pulmonary effort is normal.     Breath sounds: Examination of the right-lower field reveals decreased breath sounds. Decreased breath sounds present. No wheezing.  Musculoskeletal:     Cervical back: Normal range of motion.  Lymphadenopathy:     Cervical: No cervical adenopathy.  Neurological:     Mental Status: He  is alert.  Psychiatric:        Attention and Perception: Attention normal.        Mood and Affect: Mood normal.    Wt Readings from Last 3 Encounters:  05/12/21 180 lb 9.6 oz (81.9 kg)  04/02/21 175 lb (79.4 kg)  03/30/21 175 lb (79.4 kg)    BP 122/78    Pulse 92    Temp 98.3 F (36.8 C) (Oral)    Ht $R'5\' 7"'IX$  (1.702 m)    Wt 180 lb 9.6 oz (81.9 kg)    SpO2 96%    BMI 28.29 kg/m   Assessment and Plan: 1. Community acquired pneumonia, unspecified laterality Suspect bacterial infection following Covid viral infection three weeks ago. Continue Albuterol nebs 2-3 times per day. Maintain hydration. Follow up after antibiotics if no improvement. - DG Chest 2 View; Future - CBC with Differential/Platelet - cefdinir (OMNICEF) 300 MG capsule; Take 1 capsule (300 mg total) by mouth 2 (two) times daily for 10 days.  Dispense: 20 capsule; Refill: 0   Partially dictated using Editor, commissioning. Any errors are unintentional.  Halina Maidens, MD [facility]  05/12/2021

## 2021-05-18 ENCOUNTER — Other Ambulatory Visit: Payer: Self-pay | Admitting: Internal Medicine

## 2021-05-18 ENCOUNTER — Telehealth: Payer: Self-pay | Admitting: Internal Medicine

## 2021-05-18 DIAGNOSIS — J189 Pneumonia, unspecified organism: Secondary | ICD-10-CM

## 2021-05-18 DIAGNOSIS — E1165 Type 2 diabetes mellitus with hyperglycemia: Secondary | ICD-10-CM | POA: Diagnosis not present

## 2021-05-18 MED ORDER — PROMETHAZINE-DM 6.25-15 MG/5ML PO SYRP: 5.0000 mL | ORAL_SOLUTION | Freq: Four times a day (QID) | ORAL | 0 refills | Status: DC | PRN

## 2021-05-18 NOTE — Telephone Encounter (Signed)
Copied from [place] [id number]. Topic: General - Other >> May 18, 2021 10:22 AM Valere Dross wrote: Reason for CRM: Pt called in stating he has a bad cough and was told by PCP that if it doesn't get better to call in to get some medication, please advise.

## 2021-05-18 NOTE — Telephone Encounter (Signed)
Called pt let him know that a cough syrup was sent in. Pt verbalized understanding.  KP

## 2021-05-18 NOTE — Telephone Encounter (Signed)
Spoke to pt he still would like a cough medication sent in.  KP

## 2021-05-18 NOTE — Telephone Encounter (Signed)
Please review.  KP

## 2021-05-19 ENCOUNTER — Encounter: Payer: Self-pay | Admitting: Podiatry

## 2021-05-19 ENCOUNTER — Other Ambulatory Visit: Payer: Self-pay

## 2021-05-19 ENCOUNTER — Ambulatory Visit: Payer: Medicare Other | Admitting: Podiatry

## 2021-05-19 DIAGNOSIS — M722 Plantar fascial fibromatosis: Secondary | ICD-10-CM | POA: Diagnosis not present

## 2021-05-19 MED ORDER — TRIAMCINOLONE ACETONIDE 40 MG/ML IJ SUSP
20.0000 mg | Freq: Once | INTRAMUSCULAR | Status: AC
  Administered 2021-05-19: 20 mg

## 2021-05-19 NOTE — Progress Notes (Signed)
He presents today for follow-up of his Planter fasciitis left foot states that he is really 100% better that he still has some painful days.  He says for instance today is not painful at all.  He is still recovering from a COVID-related pneumonia  Objective: Vital signs are stable he is alert and oriented x3.  Pulses are palpable.  He still has pain on palpation medial calcaneal tubercle of the left heel but it is not swollen or warm to the touch.  Moderately tender on palpation.  Assessment resolving Planter fasciitis left.  Plan: We injected his left heel today 20 mg Kenalog 5 mg Marcaine point maximal tenderness and off follow-up with him in 1 month.  He will continue his conservative therapies medication brace.

## 2021-05-20 ENCOUNTER — Encounter: Payer: Self-pay | Admitting: Internal Medicine

## 2021-05-22 ENCOUNTER — Other Ambulatory Visit: Payer: Self-pay | Admitting: Internal Medicine

## 2021-05-22 DIAGNOSIS — E1040 Type 1 diabetes mellitus with diabetic neuropathy, unspecified: Secondary | ICD-10-CM

## 2021-05-22 DIAGNOSIS — F39 Unspecified mood [affective] disorder: Secondary | ICD-10-CM

## 2021-05-22 NOTE — Telephone Encounter (Signed)
Requested Prescriptions  Pending Prescriptions Disp Refills   buPROPion (WELLBUTRIN SR) 150 MG 12 hr tablet [Pharmacy Med Name: BUPROPION HCL ER (SR) 150 MG TAB] 90 tablet 0    Sig: TAKE 1 TABLET BY MOUTH EVERY MORNING AND2 TABLETS AT BEDTIME     Psychiatry: Antidepressants - bupropion Passed - 05/22/2021  1:05 PM      Passed - Last BP in normal range    BP Readings from Last 1 Encounters:  05/12/21 122/78         Passed - Valid encounter within last 6 months    Recent Outpatient Visits          1 week ago Community acquired pneumonia, unspecified laterality   [facility] Glean Hess, MD   4 weeks ago COVID-19 virus infection   [facility] Glean Hess, MD   4 months ago History of gross hematuria   [facility] Glean Hess, MD   9 months ago Centrilobular emphysema [facility])   Taylor, Laura H, MD   1 year ago COPD with acute exacerbation [facility])   [facility] Glean Hess, MD      Future Appointments            In 4 days Glean Hess, MD [facility], [place]   In 2 weeks Diamantina Providence, Herbert Seta, MD [facility]            gabapentin (NEURONTIN) 300 MG capsule [Pharmacy Med Name: GABAPENTIN 300 MG CAP] 540 capsule 0    Sig: TAKE 2 [place]     Neurology: Anticonvulsants - gabapentin Passed - 05/22/2021  1:05 PM      Passed - Valid encounter within last 12 months    Recent Outpatient Visits          1 week ago Community acquired pneumonia, unspecified laterality   [facility] Glean Hess, MD   4 weeks ago COVID-19 virus infection   [facility] Glean Hess, MD   4 months ago History of gross hematuria   [facility] Glean Hess, MD   9 months ago Centrilobular emphysema [facility])   [place], Laura H, MD   1 year ago COPD with acute exacerbation [facility])   Grandfather, Laura H, MD      Future Appointments            In 4 days Army Melia Jesse Sans, MD [facility], [place]   In 2 weeks Diamantina Providence, Herbert Seta, MD [place]

## 2021-05-24 ENCOUNTER — Other Ambulatory Visit: Payer: Self-pay | Admitting: Internal Medicine

## 2021-05-24 DIAGNOSIS — I700 Atherosclerosis of aorta: Secondary | ICD-10-CM

## 2021-05-24 NOTE — Telephone Encounter (Signed)
Requested medications are due for refill today.  unsure  Requested medications are on the active medications list.  no  Last refill. 02/22/2021  Future visit scheduled.   yes  Notes to clinic. Rx for this medication was written 02/22/2021. Pt had urology procedure, please review.     Requested Prescriptions  Pending Prescriptions Disp Refills   tamsulosin (FLOMAX) 0.4 MG CAPS capsule [Pharmacy Med Name: TAMSULOSIN HCL 0.4 MG CAP] 90 capsule     Sig: TAKE 1 CAPSULE BY MOUTH AT BEDTIME.     Urology: Alpha-Adrenergic Blocker Passed - 05/24/2021  5:33 AM      Passed - Last BP in normal range    BP Readings from Last 1 Encounters:  05/12/21 122/78          Passed - Valid encounter within last 12 months    Recent Outpatient Visits           1 week ago Community acquired pneumonia, unspecified laterality   [facility] Glean Hess, MD   1 month ago COVID-19 virus infection   [facility] Glean Hess, MD   4 months ago History of gross hematuria   [facility] Glean Hess, MD   9 months ago Centrilobular emphysema [facility])   [facility] Glean Hess, MD   1 year ago COPD with acute exacerbation [facility])   [facility] Glean Hess, MD       Future Appointments             In 2 days Glean Hess, MD [facility], [place]   In 2 weeks Billey Co, MD [facility]            Signed Prescriptions Disp Refills   pravastatin (PRAVACHOL) 40 MG tablet 90 tablet 0    Sig: TAKE 1 TABLET BY MOUTH DAILY     Cardiovascular:  Antilipid - Statins Passed - 05/24/2021  5:33 AM      Passed - Total Cholesterol in normal range and within 360 days    Cholesterol, Total  Date Value Ref Range Status  08/06/2020 121 100 - 199 mg/dL Final          Passed - LDL in normal range and within 360 days    LDL Chol Calc (NIH)  Date Value Ref Range Status  08/06/2020 59 0 - 99 mg/dL Final           Passed - HDL in normal range and within 360 days    HDL  Date Value Ref Range Status  08/06/2020 44 >39 mg/dL Final          Passed - Triglycerides in normal range and within 360 days    Triglycerides  Date Value Ref Range Status  08/06/2020 97 0 - 149 mg/dL Final          Passed - Patient is not pregnant      Passed - Valid encounter within last 12 months    Recent Outpatient Visits           1 week ago Community acquired pneumonia, unspecified laterality   [facility] Glean Hess, MD   1 month ago COVID-19 virus infection   [facility] Glean Hess, MD   4 months ago History of gross hematuria   [facility] Glean Hess, MD   9 months ago Centrilobular emphysema [facility])   [facility] Glean Hess,  MD   1 year ago COPD with acute exacerbation [facility])   [facility] Glean Hess, MD       Future Appointments             In 2 days Army Melia Jesse Sans, MD [facility], [place]   In 2 weeks Diamantina Providence, Herbert Seta, [facility]

## 2021-05-24 NOTE — Telephone Encounter (Signed)
Requested Prescriptions  Pending Prescriptions Disp Refills   tamsulosin (FLOMAX) 0.4 MG CAPS capsule [Pharmacy Med Name: TAMSULOSIN HCL 0.4 MG CAP] 90 capsule     Sig: TAKE 1 CAPSULE BY MOUTH AT BEDTIME.     Urology: Alpha-Adrenergic Blocker Passed - 05/24/2021  5:33 AM      Passed - Last BP in normal range    BP Readings from Last 1 Encounters:  05/12/21 122/78         Passed - Valid encounter within last 12 months    Recent Outpatient Visits          1 week ago Community acquired pneumonia, unspecified laterality   [facility] Glean Hess, MD   1 month ago COVID-19 virus infection   [facility] Glean Hess, MD   4 months ago History of gross hematuria   [facility] Glean Hess, MD   9 months ago Centrilobular emphysema [facility])   [place], Laura H, MD   1 year ago COPD with acute exacerbation [facility])   [facility] Glean Hess, MD      Future Appointments            In 2 days Glean Hess, MD [facility] [place]   In 2 weeks Billey Co, MD [place]            pravastatin (PRAVACHOL) 40 MG tablet [Pharmacy Med Name: PRAVASTATIN SODIUM 40 MG TAB] 90 tablet 1    Sig: TAKE 1 TABLET BY MOUTH DAILY     Cardiovascular:  Antilipid - Statins Passed - 05/24/2021  5:33 AM      Passed - Total Cholesterol in normal range and within 360 days    Cholesterol, Total  Date Value Ref Range Status  08/06/2020 121 100 - 199 mg/dL Final         Passed - LDL in normal range and within 360 days    LDL Chol Calc (NIH)  Date Value Ref Range Status  08/06/2020 59 0 - 99 mg/dL Final         Passed - HDL in normal range and within 360 days    HDL  Date Value Ref Range Status  08/06/2020 44 >39 mg/dL Final         Passed - Triglycerides in normal range and within 360 days    Triglycerides  Date Value Ref Range Status  08/06/2020 97 0 - 149 mg/dL Final          Passed - Patient is not pregnant      Passed - Valid encounter within last 12 months    Recent Outpatient Visits          1 week ago Community acquired pneumonia, unspecified laterality   [facility] Glean Hess, MD   1 month ago COVID-19 virus infection   [facility] Glean Hess, MD   4 months ago History of gross hematuria   [facility] Glean Hess, MD   9 months ago Centrilobular emphysema [facility])   [place], Laura H, MD   1 year ago COPD with acute exacerbation [facility])   [place], Laura H, MD      Future Appointments            In 2 days Glean Hess, MD [facility], [place]   In 2 weeks Billey Co, MD [facility]

## 2021-05-26 ENCOUNTER — Ambulatory Visit (INDEPENDENT_AMBULATORY_CARE_PROVIDER_SITE_OTHER): Payer: Medicare Other | Admitting: Internal Medicine

## 2021-05-26 ENCOUNTER — Encounter: Payer: Self-pay | Admitting: Internal Medicine

## 2021-05-26 ENCOUNTER — Other Ambulatory Visit: Payer: Self-pay

## 2021-05-26 VITALS — BP 142/70 | HR 91 | Temp 98.1°F | Ht 67.0 in | Wt 179.4 lb

## 2021-05-26 DIAGNOSIS — N138 Other obstructive and reflux uropathy: Secondary | ICD-10-CM

## 2021-05-26 DIAGNOSIS — E785 Hyperlipidemia, unspecified: Secondary | ICD-10-CM

## 2021-05-26 DIAGNOSIS — E1069 Type 1 diabetes mellitus with other specified complication: Secondary | ICD-10-CM | POA: Diagnosis not present

## 2021-05-26 DIAGNOSIS — G4700 Insomnia, unspecified: Secondary | ICD-10-CM

## 2021-05-26 DIAGNOSIS — Z0000 Encounter for general adult medical examination without abnormal findings: Secondary | ICD-10-CM | POA: Diagnosis not present

## 2021-05-26 DIAGNOSIS — F39 Unspecified mood [affective] disorder: Secondary | ICD-10-CM | POA: Diagnosis not present

## 2021-05-26 DIAGNOSIS — E1040 Type 1 diabetes mellitus with diabetic neuropathy, unspecified: Secondary | ICD-10-CM

## 2021-05-26 DIAGNOSIS — J189 Pneumonia, unspecified organism: Secondary | ICD-10-CM

## 2021-05-26 DIAGNOSIS — N401 Enlarged prostate with lower urinary tract symptoms: Secondary | ICD-10-CM

## 2021-05-26 MED ORDER — AZITHROMYCIN 250 MG PO TABS: ORAL_TABLET | ORAL | 0 refills | Status: AC

## 2021-05-26 NOTE — Progress Notes (Signed)
Date:  05/26/2021   Name:  Paul Stokes   DOB:  03/18/1956   MRN:  [id number]   Chief Complaint: Annual Exam (Foot Exam.) DERREN SUYDAM is a 66 y.o. male who presents today for his Complete Annual Exam. He feels poorly. He reports being active. He reports he is sleeping fairly well.   He is recovering slowly from recent pneumonia.  Colonoscopy: 08/2019  Immunization History  Administered Date(s) Administered   Influenza Inj Mdck Quad Pf 02/22/2019   Influenza Split 03/05/2020   Influenza,inj,Quad PF,6+ Mos 03/12/2018   Influenza-Unspecified 03/15/2015, 03/06/2019   PFIZER Comirnaty(Gray Top)Covid-19 Tri-Sucrose Vaccine 07/30/2019, 08/20/2019   PFIZER(Purple Top)SARS-COV-2 Vaccination 07/30/2019, 08/20/2019   Pneumococcal Polysaccharide-23 05/17/2010, 03/05/2020   Zoster, Live 05/23/2013    Lab Results  Component Value Date   PSA1 3.1 08/06/2018   PSA1 3.3 06/15/2015   PSA 2.6 05/23/2013    Diabetes He presents for his follow-up diabetic visit. He has type 1 (followed by Endo) diabetes mellitus. Pertinent negatives for hypoglycemia include no dizziness, headaches or nervousness/anxiousness. Associated symptoms include fatigue. Pertinent negatives for diabetes include no chest pain. Current diabetic treatment includes insulin pump. He is compliant with treatment all of the time. An ACE inhibitor/angiotensin II receptor blocker is not being taken. Eye exam is not current.  Hyperlipidemia This is a chronic problem. The problem is controlled. Associated symptoms include shortness of breath. Pertinent negatives include no chest pain or myalgias. Current antihyperlipidemic treatment includes statins. The current treatment provides significant improvement of lipids.  Insomnia Primary symptoms: no sleep disturbance.   The problem occurs nightly. Past treatments include medication. The treatment provided significant relief.   Lab Results  Component Value Date   NA 136 03/30/2021   K  4.1 03/30/2021   CO2 25 03/30/2021   GLUCOSE 144 (H) 03/30/2021   BUN 20 03/30/2021   CREATININE 0.99 03/30/2021   CALCIUM 8.9 03/30/2021   EGFR 77 08/06/2020   GFRNONAA >60 03/30/2021   Lab Results  Component Value Date   CHOL 121 08/06/2020   HDL 44 08/06/2020   LDLCALC 59 08/06/2020   TRIG 97 08/06/2020   CHOLHDL 2.8 08/06/2020   Lab Results  Component Value Date   TSH 1.510 08/06/2020   Lab Results  Component Value Date   HGBA1C 9.8 02/05/2021   Lab Results  Component Value Date   WBC 12.9 (H) 05/12/2021   HGB 13.3 05/12/2021   HCT 42.3 05/12/2021   MCV 91.0 05/12/2021   PLT 393 05/12/2021   Lab Results  Component Value Date   ALT 16 08/06/2020   AST 19 08/06/2020   ALKPHOS 106 08/06/2020   BILITOT 0.4 08/06/2020   No results found for: 25OHVITD2, 25OHVITD3, VD25OH   Review of Systems  Constitutional:  Positive for fatigue. Negative for appetite change, chills, diaphoresis and unexpected weight change.  HENT:  Negative for hearing loss, tinnitus, trouble swallowing and voice change.   Eyes:  Negative for visual disturbance.  Respiratory:  Positive for cough (still productive of yellow phlegm) and shortness of breath. Negative for choking.   Cardiovascular:  Negative for chest pain, palpitations and leg swelling.  Gastrointestinal:  Negative for abdominal pain, blood in stool, constipation and diarrhea.  Genitourinary:  Negative for difficulty urinating, dysuria and frequency.  Musculoskeletal:  Negative for arthralgias, back pain and myalgias.  Skin:  Negative for color change and rash.  Neurological:  Negative for dizziness, syncope and headaches.  Hematological:  Negative for adenopathy.  Psychiatric/Behavioral:  Negative for dysphoric mood and sleep disturbance. The patient has insomnia. The patient is not nervous/anxious.    Patient Active Problem List   Diagnosis Date Noted   History of gross hematuria 01/05/2021   Centrilobular emphysema ([place])  12/11/2019   Personal history of colonic polyps    Aortic atherosclerosis ([place]) 08/27/2019   Dyspepsia 08/06/2018   Acute bilateral low back pain with right-sided sciatica 11/29/2017   Mood disorder ([place]) 11/15/2016   BPH with obstruction/lower urinary tract symptoms 06/15/2015   Hyperlipidemia due to type 1 diabetes mellitus ([place]) 06/15/2015   Insomnia, persistent 03/06/2015   Compulsive tobacco user syndrome 03/06/2015   Type 1 diabetes mellitus with sensory neuropathy ([place]) 03/06/2015   Presence of insulin pump 06/08/2014    Allergies  Allergen Reactions   Lisinopril Cough    Past Surgical History:  Procedure Laterality Date   CATARACT EXTRACTION, BILATERAL     COLONOSCOPY  08/14/2013   7 tubular adenomas   COLONOSCOPY WITH PROPOFOL N/A 09/10/2019   Procedure: COLONOSCOPY WITH PROPOFOL;  Surgeon: [name], MD;  Location: [facility] END[facility]COPY;  Service: Endoscopy;  Laterality: N/A;   COLONOSCOPY WITH PROPOFOL N/A 09/11/2019   Procedure: COLONOSCOPY WITH PROPOFOL;  Surgeon: Jonathon Bellows, MD;  [facility];  Service: Gastroenterology;  Laterality: N/A;   HOLEP-LASER ENUCLEATION OF THE PROSTATE WITH MORCELLATION N/A 04/02/2021   Procedure: HOLEP-LASER ENUCLEATION OF THE PROSTATE WITH MORCELLATION;  Surgeon: Billey Co, MD;  Location: [facility] ORS;  Service: Urology;  Laterality: N/A;    Social History   Tobacco Use   Smoking status: Every Day    Packs/day: 0.50    Years: 47.00    Pack years: 23.50    Types: Cigarettes    Start date: 05/12/1968   Smokeless tobacco: Never  Vaping Use   Vaping Use: Never used  Substance Use Topics   Alcohol use: No    Alcohol/week: 0.0 standard drinks   Drug use: No     Medication list has been reviewed and updated.  Current Meds  Medication Sig   albuterol (VENTOLIN HFA) 108 (90 Base) MCG/ACT inhaler Inhale 2 puffs into the lungs every 6 (six) hours as needed for wheezing or shortness of breath.   BAYER CONTOUR NEXT  TEST test strip    buPROPion (WELLBUTRIN SR) 150 MG 12 hr tablet TAKE 1 TABLET BY MOUTH EVERY MORNING AND2 TABLETS AT BEDTIME   Continuous Blood Gluc Sensor (DEXCOM G6 SENSOR) MISC by Does not apply route.   fluticasone-salmeterol (ADVAIR HFA) 45-21 MCG/ACT inhaler Inhale 2 puffs into the lungs 2 (two) times daily. (Patient taking differently: Inhale 2 puffs into the lungs daily.)   gabapentin (NEURONTIN) 300 MG capsule TAKE 2 CAPULES BY MOUTH THREE TIMES DAILY   HUMALOG 100 UNIT/ML injection Inject 80 Units into the skin See admin instructions. 80 units per day through insulin pump   pravastatin (PRAVACHOL) 40 MG tablet TAKE 1 TABLET BY MOUTH DAILY   promethazine-dextromethorphan (PROMETHAZINE-DM) 6.25-15 MG/5ML syrup Take 5 mLs by mouth 4 (four) times daily as needed for cough.   tamsulosin (FLOMAX) 0.4 MG CAPS capsule TAKE 1 CAPSULE BY MOUTH AT BEDTIME.   vitamin B-12 (CYANOCOBALAMIN) 500 MCG tablet Take 500 mcg by mouth daily.   VITAMIN D PO Take 5,000 Units by mouth daily.   zolpidem (AMBIEN) 10 MG tablet Take 1 tablet (10 mg total) by mouth at bedtime.    PHQ 2/9 Scores 05/26/2021 05/12/2021 04/23/2021 01/05/2021  PHQ - 2 Score 2 4  0 0  PHQ- 9 Score $Remov'7 15 2 5    'jzMEVy$ GAD 7 : Generalized Anxiety Score 05/26/2021 05/12/2021 04/23/2021 01/05/2021  Nervous, Anxious, on Edge 0 0 0 0  Control/stop worrying 0 0 0 0  Worry too much - different things 0 0 0 0  Trouble relaxing 0 2 0 0  Restless 0 0 0 1  Easily annoyed or irritable 0 0 0 1  Afraid - awful might happen 0 0 0 0  Total GAD 7 Score 0 2 0 2  Anxiety Difficulty Not difficult at all Somewhat difficult Not difficult at all -    BP Readings from Last 3 Encounters:  05/26/21 (!) 142/70  05/12/21 122/78  04/02/21 (!) 146/67    Physical Exam Vitals and nursing note reviewed.  Constitutional:      Appearance: Normal appearance. He is well-developed.  HENT:     Head: Normocephalic.     Right Ear: Tympanic membrane, ear canal and  external ear normal.     Left Ear: Tympanic membrane, ear canal and external ear normal.     Nose: Nose normal.  Eyes:     Conjunctiva/sclera: Conjunctivae normal.     Pupils: Pupils are equal, round, and reactive to light.  Neck:     Thyroid: No thyromegaly.     Vascular: No carotid bruit.  Cardiovascular:     Rate and Rhythm: Normal rate and regular rhythm.     Heart sounds: Normal heart sounds.  Pulmonary:     Effort: Pulmonary effort is normal. No accessory muscle usage, prolonged expiration or respiratory distress.     Breath sounds: Examination of the right-middle field reveals rhonchi. Rhonchi present. No wheezing.  Chest:  Breasts:    Right: No mass.     Left: No mass.  Abdominal:     General: Bowel sounds are normal.     Palpations: Abdomen is soft.     Tenderness: There is no abdominal tenderness.  Musculoskeletal:        General: Normal range of motion.     Cervical back: Normal range of motion and neck supple.  Lymphadenopathy:     Cervical: No cervical adenopathy.  Skin:    General: Skin is warm and dry.  Neurological:     Mental Status: He is alert and oriented to person, place, and time.     Deep Tendon Reflexes: Reflexes are normal and symmetric.  Psychiatric:        Attention and Perception: Attention normal.        Mood and Affect: Mood normal.        Thought Content: Thought content normal.    Wt Readings from Last 3 Encounters:  05/26/21 179 lb 6.4 oz (81.4 kg)  05/12/21 180 lb 9.6 oz (81.9 kg)  04/02/21 175 lb (79.4 kg)    BP (!) 142/70    Pulse 91    Temp 98.1 F (36.7 C)    Ht $R'5\' 7"'zy$  (1.702 m)    Wt 179 lb 6.4 oz (81.4 kg)    SpO2 96%    BMI 28.10 kg/m   Assessment and Plan: 1. Annual physical exam Continue healthy diet and exercise. Recommend PNA vaccine next visit  2. Type 1 diabetes mellitus with sensory neuropathy ([place]) Continue follow up with Endo; continue insulin pump - CBC with Differential/Platelet - Comprehensive metabolic  panel - Microalbumin / creatinine urine ratio - Hemoglobin A1c  3. Hyperlipidemia due to type 1 diabetes mellitus ([place]) Tolerating statin medication  without side effects at this time LDL is at goal of < 70 on current dose Continue same therapy without change at this time. - Lipid panel  4. Insomnia, persistent Doing well on Ambien  5. Mood disorder (HCC) Stable on bupropion  6. Community acquired pneumonia of right middle lobe of lung Slowly improving but still having productive cough. Will treat with Zpak for better coverage. Continue cough syrup. Continue fluids and activity as tolerated Follow up in 2 months for repeat CXR, Prevnar-20 - CBC with Differential/Platelet - azithromycin (ZITHROMAX Z-PAK) 250 MG tablet; UAD  Dispense: 6 each; Refill: 0   Partially dictated using Editor, commissioning. Any errors are unintentional.  Halina Maidens, MD [facility]  05/26/2021

## 2021-05-27 LAB — COMPREHENSIVE METABOLIC PANEL
ALT: 19 IU/L (ref 0–44)
AST: 13 IU/L (ref 0–40)
Albumin/Globulin Ratio: 1.7 (ref 1.2–2.2)
Albumin: 4.4 g/dL (ref 3.8–4.8)
Alkaline Phosphatase: 124 IU/L — ABNORMAL HIGH (ref 44–121)
BUN/Creatinine Ratio: 12 (ref 10–24)
BUN: 14 mg/dL (ref 8–27)
Bilirubin Total: 0.3 mg/dL (ref 0.0–1.2)
CO2: 25 mmol/L (ref 20–29)
Calcium: 9.6 mg/dL (ref 8.6–10.2)
Chloride: 102 mmol/L (ref 96–106)
Creatinine, Ser: 1.13 mg/dL (ref 0.76–1.27)
Globulin, Total: 2.6 g/dL (ref 1.5–4.5)
Glucose: 152 mg/dL — ABNORMAL HIGH (ref 70–99)
Potassium: 4.8 mmol/L (ref 3.5–5.2)
Sodium: 139 mmol/L (ref 134–144)
Total Protein: 7 g/dL (ref 6.0–8.5)
eGFR: 72 mL/min/{1.73_m2} (ref >59)

## 2021-05-27 LAB — HEMOGLOBIN A1C
Est. average glucose Bld gHb Est-mCnc: 186 mg/dL
Hgb A1c MFr Bld: 8.1 % — ABNORMAL HIGH (ref 4.8–5.6)

## 2021-05-27 LAB — MICROALBUMIN / CREATININE URINE RATIO
Creatinine, Urine: 229.9 mg/dL
Microalb/Creat Ratio: 277 mg/g creat — ABNORMAL HIGH (ref 0–29)
Microalbumin, Urine: 637.1 ug/mL

## 2021-05-27 LAB — CBC WITH DIFFERENTIAL/PLATELET
Basophils Absolute: 0.2 10*3/uL (ref 0.0–0.2)
Basos: 2 %
EOS (ABSOLUTE): 0.3 10*3/uL (ref 0.0–0.4)
Eos: 3 %
Hematocrit: 44.1 % (ref 37.5–51.0)
Hemoglobin: 14.3 g/dL (ref 13.0–17.7)
Immature Grans (Abs): 0.1 10*3/uL (ref 0.0–0.1)
Immature Granulocytes: 1 %
Lymphocytes Absolute: 2.7 10*3/uL (ref 0.7–3.1)
Lymphs: 27 %
MCH: 28.9 pg (ref 26.6–33.0)
MCHC: 32.4 g/dL (ref 31.5–35.7)
MCV: 89 fL (ref 79–97)
Monocytes Absolute: 0.9 10*3/uL (ref 0.1–0.9)
Monocytes: 9 %
Neutrophils Absolute: 5.8 10*3/uL (ref 1.4–7.0)
Neutrophils: 58 %
Platelets: 459 10*3/uL — ABNORMAL HIGH (ref 150–450)
RBC: 4.94 x10E6/uL (ref 4.14–5.80)
RDW: 11.8 % (ref 11.6–15.4)
WBC: 10 10*3/uL (ref 3.4–10.8)

## 2021-05-27 LAB — LIPID PANEL
Chol/HDL Ratio: 2.7 ratio (ref 0.0–5.0)
Cholesterol, Total: 159 mg/dL (ref 100–199)
HDL: 58 mg/dL (ref >39)
LDL Chol Calc (NIH): 88 mg/dL (ref 0–99)
Triglycerides: 69 mg/dL (ref 0–149)
VLDL Cholesterol Cal: 13 mg/dL (ref 5–40)

## 2021-05-28 ENCOUNTER — Telehealth: Payer: Self-pay | Admitting: Internal Medicine

## 2021-05-28 ENCOUNTER — Other Ambulatory Visit: Payer: Self-pay

## 2021-05-28 DIAGNOSIS — R809 Proteinuria, unspecified: Secondary | ICD-10-CM

## 2021-05-28 MED ORDER — LOSARTAN POTASSIUM 25 MG PO TABS: 25.0000 mg | ORAL_TABLET | Freq: Every day | ORAL | 1 refills | Status: DC

## 2021-05-28 NOTE — Progress Notes (Signed)
Sent in losartan 25mg  to [facility]

## 2021-05-28 NOTE — Telephone Encounter (Signed)
Pt is calling that he is in agreement with Dr. Army Melia calling in a low dose lisinopril to help with kidney protection. Can this be send to [facility].  CB-CaB- [phone]

## 2021-06-10 ENCOUNTER — Ambulatory Visit: Payer: Medicare Other | Admitting: Urology

## 2021-06-10 ENCOUNTER — Encounter: Payer: Self-pay | Admitting: Urology

## 2021-06-10 ENCOUNTER — Other Ambulatory Visit: Payer: Self-pay

## 2021-06-10 VITALS — BP 135/74 | HR 91 | Ht 67.0 in | Wt 179.0 lb

## 2021-06-10 DIAGNOSIS — N401 Enlarged prostate with lower urinary tract symptoms: Secondary | ICD-10-CM | POA: Diagnosis not present

## 2021-06-10 DIAGNOSIS — N3281 Overactive bladder: Secondary | ICD-10-CM

## 2021-06-10 DIAGNOSIS — N138 Other obstructive and reflux uropathy: Secondary | ICD-10-CM | POA: Diagnosis not present

## 2021-06-10 LAB — BLADDER SCAN AMB NON-IMAGING

## 2021-06-10 MED ORDER — MIRABEGRON ER 50 MG PO TB24: 50.0000 mg | ORAL_TABLET | Freq: Every day | ORAL | 0 refills | Status: DC

## 2021-06-10 NOTE — Progress Notes (Signed)
° °  06/10/2021 1:51 PM   Paul Stokes July 15, 1955 [id number]  Reason for visit: Follow up BPH status post HOLEP  HPI: 66 year old male who presented with symptoms of weak urinary stream, intermittent painless gross hematuria with clots, nocturia 5-6 times overnight, and worsening urinary symptoms if he missed a dose of Flomax.  Work-up showed a 120 g prostate and he underwent an uncomplicated HOLEP on 46/56/8127 with removal of 74 g of benign prostate tissue.  Notably, he has diabetes with a recent hemoglobin A1c of 8.1.  He drinks primarily [facility] during the day.  He reports significant improvement in his urinary symptoms after surgery.  He is urinating with a strong stream, and nocturia has improved to 0-1 time overnight.  He has mild stress incontinence, and some urgency, and occasional urge incontinence.  I recommended a short course of Myrbetriq as things continue to heal after surgery, and we discussed avoiding bladder irritants like diet sodas as well as improved control of his diabetes.  We also reviewed Kegel exercises.    RTC 6 months PVR   Billey Co, [place] [address] [place], [place] [address] [phone]

## 2021-06-10 NOTE — Patient Instructions (Signed)
Kegel Exercises Kegel exercises can help strengthen your pelvic floor muscles. The pelvic floor is a group of muscles that support your rectum, small intestine, and bladder. In females, pelvic floor muscles also help support the uterus. These muscles help you control the flow of urine and stool (feces). Kegel exercises are painless and simple. They do not require any equipment. Your provider may suggest Kegel exercises to: Improve bladder and bowel control. Improve sexual response. Improve weak pelvic floor muscles after surgery to remove the uterus (hysterectomy) or after pregnancy, in females. Improve weak pelvic floor muscles after prostate gland removal or surgery, in males. Kegel exercises involve squeezing your pelvic floor muscles. These are the same muscles you squeeze when you try to stop the flow of urine or keep from passing gas. The exercises can be done while sitting, standing, or lying down, but it is best to vary your position. Ask your health care provider which exercises are safe for you. Do exercises exactly as told by your health care provider and adjust them as directed. Do not begin these exercises until told by your health care provider. Exercises How to do Kegel exercises: Squeeze your pelvic floor muscles tight. You should feel a tight lift in your rectal area. If you are a male, you should also feel a tightness in your vaginal area. Keep your stomach, buttocks, and legs relaxed. Hold the muscles tight for up to 10 seconds. Breathe normally. Relax your muscles for up to 10 seconds. Repeat as told by your health care provider. Repeat this exercise daily as told by your health care provider. Continue to do this exercise for at least 4-6 weeks, or for as long as told by your health care provider. You may be referred to a physical therapist who can help you learn more about how to do Kegel exercises. Depending on your condition, your health care provider may  recommend: Varying how long you squeeze your muscles. Doing several sets of exercises every day. Doing exercises for several weeks. Making Kegel exercises a part of your regular exercise routine. This information is not intended to replace advice given to you by your health care provider. Make sure you discuss any questions you have with your health care provider. Document Revised: 09/10/2020 Document Reviewed: 09/10/2020 Elsevier Patient Education  2022 [employer].

## 2021-06-18 DIAGNOSIS — E1165 Type 2 diabetes mellitus with hyperglycemia: Secondary | ICD-10-CM | POA: Diagnosis not present

## 2021-06-21 ENCOUNTER — Ambulatory Visit: Payer: Self-pay

## 2021-06-21 NOTE — Telephone Encounter (Signed)
° ° °  Chief Complaint: Cough with green mucus Symptoms: Wheezing, SOB with exertion Frequency: Finished antibiotics. Pertinent Negatives: Patient denies fever. Disposition: [] ED /[] Urgent Care (no appt availability in office) / [x] Appointment(In office/virtual)/ []  [place] [facility]/ [] Home Care/ [] Refused Recommended Disposition /[] [facility]/ []  Follow-up with PCP Additional Notes:   Reason for Disposition  [1] Continuous (nonstop) coughing interferes with work or school AND [2] no improvement using cough treatment per Care Advice  Answer Assessment - Initial Assessment Questions 1. ONSET: "When did the cough begin?"      Jan. 2. SEVERITY: "How bad is the cough today?"      Moderate 3. SPUTUM: "Describe the color of your sputum" (none, dry cough; clear, white, yellow, green)     Green 4. HEMOPTYSIS: "Are you coughing up any blood?" If so ask: "How much?" (flecks, streaks, tablespoons, etc.)     No 5. DIFFICULTY BREATHING: "Are you having difficulty breathing?" If Yes, ask: "How bad is it?" (e.g., mild, moderate, severe)    - MILD: No SOB at rest, mild SOB with walking, speaks normally in sentences, can lie down, no retractions, pulse < 100.    - MODERATE: SOB at rest, SOB with minimal exertion and prefers to sit, cannot lie down flat, speaks in phrases, mild retractions, audible wheezing, pulse 100-120.    - SEVERE: Very SOB at rest, speaks in single words, struggling to breathe, sitting hunched forward, retractions, pulse > 120      Mild 6. FEVER: "Do you have a fever?" If Yes, ask: "What is your temperature, how was it measured, and when did it start?"     No 7. CARDIAC HISTORY: "Do you have any history of heart disease?" (e.g., heart attack, congestive heart failure)      Yes 8. LUNG HISTORY: "Do you have any history of lung disease?"  (e.g., pulmonary embolus, asthma, emphysema)     Yes 9. PE RISK FACTORS: "Do you have a history of blood clots?" (or: recent major  surgery, recent prolonged travel, bedridden)     No 10. OTHER SYMPTOMS: "Do you have any other symptoms?" (e.g., runny nose, wheezing, chest pain)       Wheezing 11. PREGNANCY: "Is there any chance you are pregnant?" "When was your last menstrual period?"       N/a 12. TRAVEL: "Have you traveled out of the country in the last month?" (e.g., travel history, exposures)       No  Protocols used: Cough - Acute Productive-A-AH

## 2021-06-22 ENCOUNTER — Ambulatory Visit (INDEPENDENT_AMBULATORY_CARE_PROVIDER_SITE_OTHER): Payer: Medicare Other | Admitting: Internal Medicine

## 2021-06-22 ENCOUNTER — Other Ambulatory Visit: Payer: Self-pay

## 2021-06-22 ENCOUNTER — Other Ambulatory Visit
Admission: RE | Admit: 2021-06-22 | Discharge: 2021-06-22 | Disposition: A | Discharge status: Home / Self Care | Payer: Medicare Other | Source: Home / Self Care | Attending: Internal Medicine | Admitting: Internal Medicine

## 2021-06-22 ENCOUNTER — Encounter: Payer: Self-pay | Admitting: Internal Medicine

## 2021-06-22 ENCOUNTER — Ambulatory Visit
Admission: RE | Admit: 2021-06-22 | Discharge: 2021-06-22 | Disposition: A | Discharge status: Home / Self Care | Payer: Medicare Other | Source: Ambulatory Visit | Attending: Internal Medicine | Admitting: Internal Medicine

## 2021-06-22 ENCOUNTER — Ambulatory Visit
Admission: RE | Admit: 2021-06-22 | Discharge: 2021-06-22 | Disposition: A | Discharge status: Home / Self Care | Payer: Medicare Other | Attending: Internal Medicine | Admitting: Internal Medicine

## 2021-06-22 VITALS — BP 162/84 | HR 99 | Temp 98.0°F | Ht 67.0 in | Wt 195.0 lb

## 2021-06-22 DIAGNOSIS — J189 Pneumonia, unspecified organism: Secondary | ICD-10-CM | POA: Insufficient documentation

## 2021-06-22 LAB — CBC WITH DIFFERENTIAL/PLATELET
Abs Immature Granulocytes: 0.07 10*3/uL (ref 0.00–0.07)
Basophils Absolute: 0.2 10*3/uL — ABNORMAL HIGH (ref 0.0–0.1)
Basophils Relative: 2 %
Eosinophils Absolute: 0.8 10*3/uL — ABNORMAL HIGH (ref 0.0–0.5)
Eosinophils Relative: 8 %
HCT: 43.2 % (ref 39.0–52.0)
Hemoglobin: 14.4 g/dL (ref 13.0–17.0)
Immature Granulocytes: 1 %
Lymphocytes Relative: 31 %
Lymphs Abs: 3.1 10*3/uL (ref 0.7–4.0)
MCH: 29.4 pg (ref 26.0–34.0)
MCHC: 33.3 g/dL (ref 30.0–36.0)
MCV: 88.2 fL (ref 80.0–100.0)
Monocytes Absolute: 0.9 10*3/uL (ref 0.1–1.0)
Monocytes Relative: 9 %
Neutro Abs: 4.9 10*3/uL (ref 1.7–7.7)
Neutrophils Relative %: 49 %
Platelets: 374 10*3/uL (ref 150–400)
RBC: 4.9 MIL/uL (ref 4.22–5.81)
RDW: 12.4 % (ref 11.5–15.5)
WBC: 10.1 10*3/uL (ref 4.0–10.5)
nRBC: 0 % (ref 0.0–0.2)

## 2021-06-22 MED ORDER — LEVOFLOXACIN 500 MG PO TABS: 500.0000 mg | ORAL_TABLET | Freq: Every day | ORAL | 0 refills | Status: AC

## 2021-06-22 MED ORDER — PREDNISONE 10 MG PO TABS
ORAL_TABLET | ORAL | 0 refills | Status: DC
Start: 2021-06-22 — End: 2022-06-20

## 2021-06-22 NOTE — Progress Notes (Signed)
Date:  06/22/2021   Name:  Paul Stokes   DOB:  02/05/1956   MRN:  [id number]   Chief Complaint: Cough  Cough This is a recurrent problem. The current episode started more than 1 month ago. The problem has been waxing and waning. The cough is Productive of sputum. Associated symptoms include myalgias, rhinorrhea and wheezing. Pertinent negatives include no chest pain, chills, fever or headaches. Patient had Covid at the beginning of December 2022 and then Post Covid Pneumonia.  Lab Results  Component Value Date   NA 139 05/26/2021   K 4.8 05/26/2021   CO2 25 05/26/2021   GLUCOSE 152 (H) 05/26/2021   BUN 14 05/26/2021   CREATININE 1.13 05/26/2021   CALCIUM 9.6 05/26/2021   EGFR 72 05/26/2021   GFRNONAA >60 03/30/2021   Lab Results  Component Value Date   CHOL 159 05/26/2021   HDL 58 05/26/2021   LDLCALC 88 05/26/2021   TRIG 69 05/26/2021   CHOLHDL 2.7 05/26/2021   Lab Results  Component Value Date   TSH 1.510 08/06/2020   Lab Results  Component Value Date   HGBA1C 8.1 (H) 05/26/2021   Lab Results  Component Value Date   WBC 10.0 05/26/2021   HGB 14.3 05/26/2021   HCT 44.1 05/26/2021   MCV 89 05/26/2021   PLT 459 (H) 05/26/2021   Lab Results  Component Value Date   ALT 19 05/26/2021   AST 13 05/26/2021   ALKPHOS 124 (H) 05/26/2021   BILITOT 0.3 05/26/2021   No results found for: 25OHVITD2, 25OHVITD3, VD25OH   Review of Systems  Constitutional:  Positive for fatigue. Negative for chills, diaphoresis and fever.  HENT:  Positive for rhinorrhea. Negative for trouble swallowing.   Respiratory:  Positive for cough and wheezing.   Cardiovascular:  Negative for chest pain and palpitations.  Gastrointestinal:  Negative for constipation, nausea and vomiting.  Musculoskeletal:  Positive for myalgias.  Neurological:  Negative for dizziness, light-headedness and headaches.   Patient Active Problem List   Diagnosis Date Noted   History of gross hematuria  01/05/2021   Centrilobular emphysema ([place]) 12/11/2019   Personal history of colonic polyps    Aortic atherosclerosis ([place]) 08/27/2019   Dyspepsia 08/06/2018   Acute bilateral low back pain with right-sided sciatica 11/29/2017   Mood disorder ([place]) 11/15/2016   BPH with obstruction/lower urinary tract symptoms 06/15/2015   Hyperlipidemia due to type 1 diabetes mellitus ([place]) 06/15/2015   Insomnia, persistent 03/06/2015   Compulsive tobacco user syndrome 03/06/2015   Type 1 diabetes mellitus with sensory neuropathy ([place]) 03/06/2015   Presence of insulin pump 06/08/2014    Allergies  Allergen Reactions   Lisinopril Cough    Past Surgical History:  Procedure Laterality Date   CATARACT EXTRACTION, BILATERAL     COLONOSCOPY  08/14/2013   7 tubular adenomas   COLONOSCOPY WITH PROPOFOL N/A 09/10/2019   Procedure: COLONOSCOPY WITH PROPOFOL;  Surgeon: [name], MD;  Location: [facility] ENDOSCOPY;  Service: Endoscopy;  Laterality: N/A;   COLONOSCOPY WITH PROPOFOL N/A 09/11/2019   Procedure: COLONOSCOPY WITH PROPOFOL;  Surgeon: Jonathon Bellows, MD;  Location: [facility] ENDOSCOPY;  Service: Gastroenterology;  Laterality: N/A;   HOLEP-LASER ENUCLEATION OF THE PROSTATE WITH MORCELLATION N/A 04/02/2021   Procedure: HOLEP-LASER ENUCLEATION OF THE PROSTATE WITH MORCELLATION;  Surgeon: Billey Co, MD;  Location: [facility] ORS;  Service: Urology;  Laterality: N/A;    Social History   Tobacco Use   Smoking status: Former  Packs/day: 0.50    Years: 47.00    Pack years: 23.50    Types: Cigarettes    Start date: 05/12/1968    Quit date: 06/08/2021    Years since quitting: 0.0   Smokeless tobacco: Never  Vaping Use   Vaping Use: Never used  Substance Use Topics   Alcohol use: No    Alcohol/week: 0.0 standard drinks   Drug use: No     Medication list has been reviewed and updated.  Current Meds  Medication Sig   aspirin EC 81 MG tablet Take 81 mg by mouth daily. Swallow whole.   BAYER  CONTOUR NEXT TEST test strip    buPROPion (WELLBUTRIN SR) 150 MG 12 hr tablet TAKE 1 TABLET BY MOUTH EVERY MORNING AND2 TABLETS AT BEDTIME   Continuous Blood Gluc Sensor (DEXCOM G6 SENSOR) MISC by Does not apply route.   Cyanocobalamin 5000 MCG/ML LIQD Place under the tongue daily. Liquid   fluticasone-salmeterol (ADVAIR HFA) 45-21 MCG/ACT inhaler Inhale 2 puffs into the lungs 2 (two) times daily. (Patient taking differently: Inhale 2 puffs into the lungs daily.)   gabapentin (NEURONTIN) 300 MG capsule TAKE 2 CAPULES BY MOUTH THREE TIMES DAILY   HUMALOG 100 UNIT/ML injection Inject 80 Units into the skin See admin instructions. 80 units per day through insulin pump   losartan (COZAAR) 25 MG tablet Take 1 tablet (25 mg total) by mouth daily.   mirabegron ER (MYRBETRIQ) 50 MG TB24 tablet Take 1 tablet (50 mg total) by mouth daily.   pravastatin (PRAVACHOL) 40 MG tablet TAKE 1 TABLET BY MOUTH DAILY   VITAMIN D PO Take 5,000 Units by mouth daily.   zolpidem (AMBIEN) 10 MG tablet Take 1 tablet (10 mg total) by mouth at bedtime.    PHQ 2/9 Scores 06/22/2021 05/26/2021 05/12/2021 04/23/2021  PHQ - 2 Score 0 2 4 0  PHQ- 9 Score 0 _0 GAD 7 : Generalized Anxiety Score 06/22/2021 05/26/2021 05/12/2021 04/23/2021  Nervous, Anxious, on Edge 0 0 0 0  Control/stop worrying 0 0 0 0  Worry too much - different things 0 0 0 0  Trouble relaxing 0 0 2 0  Restless 0 0 0 0  Easily annoyed or irritable 0 0 0 0  Afraid - awful might happen 0 0 0 0  Total GAD 7 Score 0 0 2 0  Anxiety Difficulty Not difficult at all Not difficult at all Somewhat difficult Not difficult at all    BP Readings from Last 3 Encounters:  06/22/21 (!) 162/84  06/10/21 135/74  05/26/21 (!) 142/70    Physical Exam Constitutional:      Appearance: He is ill-appearing.  Cardiovascular:     Rate and Rhythm: Regular rhythm. Tachycardia present.  Pulmonary:     Effort: Accessory muscle usage and prolonged expiration present.      Breath sounds: Decreased breath sounds and wheezing present. No rhonchi.  Musculoskeletal:     Cervical back: Normal range of motion.  Lymphadenopathy:     Cervical: No cervical adenopathy.  Neurological:     Mental Status: He is alert and oriented to person, place, and time.    Wt Readings from Last 3 Encounters:  06/22/21 195 lb (88.5 kg)  06/10/21 179 lb (81.2 kg)  05/26/21 179 lb 6.4 oz (81.4 kg)    BP (!) 162/84    Pulse 99    Temp 98 F (36.7 C) (Oral)    Ht _1  (1.702  m)    Wt 195 lb (88.5 kg)    SpO2 96%    BMI 30.54 kg/m   Assessment and Plan: 1. Community acquired pneumonia, unspecified laterality Will get STAT CBC and CXR Steroid taper and levaquin Will advise on ER once labs and CXR return Recommend albuterol nebs every 3 hours alternate with atrovent/albuterol every 6 hr. - predniSONE (DELTASONE) 10 MG tablet; Take 6 on day 1and 2, 5 on day 3 and 4, 4 on day 5 and 6 , 3 on day 7 and 8, 2 on day 9 and 10 and 1 on day 11 and 12 then stop.  Dispense: 42 tablet; Refill: 0 - levofloxacin (LEVAQUIN) 500 MG tablet; Take 1 tablet (500 mg total) by mouth daily for 7 days.  Dispense: 7 tablet; Refill: 0 - CBC with Differential/Platelet - DG Chest 2 View   Partially dictated using Dragon software. Any errors are unintentional.  Halina Maidens, MD [facility]  06/22/2021

## 2021-06-23 ENCOUNTER — Encounter: Payer: Medicare Other | Admitting: Podiatry

## 2021-06-29 ENCOUNTER — Other Ambulatory Visit: Payer: Self-pay | Admitting: Internal Medicine

## 2021-06-29 DIAGNOSIS — R809 Proteinuria, unspecified: Secondary | ICD-10-CM

## 2021-06-29 NOTE — Telephone Encounter (Signed)
Requested medications are due for refill today.  early  Requested medications are on the active medications list.  yes  Last refill. New Rx 05/28/2021  Future visit scheduled.   Yes in 3 weeks  Notes to clinic.  Medication first prescribed 05/28/2020 #30 1 refill. Pt will see Dr. Army Melia prior to this medication running out. Please review    Requested Prescriptions  Pending Prescriptions Disp Refills   losartan (COZAAR) 25 MG tablet [Pharmacy Med Name: LOSARTAN POTASSIUM 25 MG TAB] 30 tablet 1    Sig: TAKE 1 TABLET BY MOUTH ONCE DAILY     Cardiovascular:  Angiotensin Receptor Blockers Failed - 06/29/2021 10:25 AM      Failed - Last BP in normal range    BP Readings from Last 1 Encounters:  06/22/21 (!) 162/84          Passed - Cr in normal range and within 180 days    Creatinine, Ser  Date Value Ref Range Status  05/26/2021 1.13 0.76 - 1.27 mg/dL Final          Passed - K in normal range and within 180 days    Potassium  Date Value Ref Range Status  05/26/2021 4.8 3.5 - 5.2 mmol/L Final          Passed - Patient is not pregnant      Passed - Valid encounter within last 6 months    Recent Outpatient Visits           1 week ago Community acquired pneumonia, unspecified laterality   [facility] Glean Hess, MD   1 month ago Annual physical exam   [facility] Glean Hess, MD   1 month ago Community acquired pneumonia, unspecified laterality   [facility] Glean Hess, MD   2 months ago COVID-19 virus infection   [facility] Glean Hess, MD   5 months ago History of gross hematuria   [facility] Glean Hess, MD       Future Appointments             In 3 weeks Army Melia Jesse Sans, MD [facility], [place]   In 11 months Army Melia Jesse Sans, MD  [facility], [facility]

## 2021-07-08 DIAGNOSIS — M5126 Other intervertebral disc displacement, lumbar region: Secondary | ICD-10-CM | POA: Diagnosis not present

## 2021-07-08 DIAGNOSIS — M5416 Radiculopathy, lumbar region: Secondary | ICD-10-CM | POA: Diagnosis not present

## 2021-07-16 DIAGNOSIS — E1165 Type 2 diabetes mellitus with hyperglycemia: Secondary | ICD-10-CM | POA: Diagnosis not present

## 2021-07-26 ENCOUNTER — Encounter: Payer: Self-pay | Admitting: Internal Medicine

## 2021-07-26 ENCOUNTER — Ambulatory Visit (INDEPENDENT_AMBULATORY_CARE_PROVIDER_SITE_OTHER): Payer: Medicare Other | Admitting: Internal Medicine

## 2021-07-26 ENCOUNTER — Other Ambulatory Visit: Payer: Self-pay

## 2021-07-26 VITALS — BP 122/80 | HR 87 | Ht 67.0 in | Wt 188.0 lb

## 2021-07-26 DIAGNOSIS — G250 Essential tremor: Secondary | ICD-10-CM | POA: Diagnosis not present

## 2021-07-26 DIAGNOSIS — I700 Atherosclerosis of aorta: Secondary | ICD-10-CM | POA: Diagnosis not present

## 2021-07-26 DIAGNOSIS — J432 Centrilobular emphysema: Secondary | ICD-10-CM | POA: Diagnosis not present

## 2021-07-26 DIAGNOSIS — R809 Proteinuria, unspecified: Secondary | ICD-10-CM

## 2021-07-26 DIAGNOSIS — E1040 Type 1 diabetes mellitus with diabetic neuropathy, unspecified: Secondary | ICD-10-CM | POA: Diagnosis not present

## 2021-07-26 MED ORDER — ADVAIR HFA 45-21 MCG/ACT IN AERO: 2.0000 | INHALATION_SPRAY | Freq: Two times a day (BID) | RESPIRATORY_TRACT | 5 refills | Status: DC

## 2021-07-26 MED ORDER — LOSARTAN POTASSIUM 25 MG PO TABS: 25.0000 mg | ORAL_TABLET | Freq: Every day | ORAL | 1 refills | Status: DC

## 2021-07-26 MED ORDER — ALBUTEROL SULFATE HFA 108 (90 BASE) MCG/ACT IN AERS: 2.0000 | INHALATION_SPRAY | Freq: Four times a day (QID) | RESPIRATORY_TRACT | 0 refills | Status: DC | PRN

## 2021-07-26 NOTE — Progress Notes (Signed)
Date:  07/26/2021   Name:  Paul Stokes   DOB:  [birth year]   MRN:  [id number]   Chief Complaint: Pneumonia  Shortness of Breath This is a new problem. The current episode started 1 to 4 weeks ago. The problem occurs rarely. The problem has been rapidly improving. Pertinent negatives include no chest pain, fever, headaches, leg swelling, vomiting or wheezing. The symptoms are aggravated by any activity. He has tried beta agonist inhalers and oral steroids (and antibiotics for CAP after Covid-19) for the symptoms. His past medical history is significant for COPD.  Diabetes He presents for his follow-up diabetic visit. He has type 1 (managed by Endo) diabetes mellitus. His disease course has been stable. Hypoglycemia symptoms include tremors. Pertinent negatives for hypoglycemia include no dizziness, headaches or nervousness/anxiousness. Pertinent negatives for diabetes include no chest pain and no fatigue. Diabetic complications include nephropathy (proteinuria). Current diabetic treatment includes insulin pump. An ACE inhibitor/angiotensin II receptor blocker is being taken.  Tremor - he has had a fine tremor for some years that comes and goes.  Only affects his hands, no head tremor.  It is with intention, not at rest.  It may be worse when his BS is fluctuating significantly.  Lab Results  Component Value Date   NA 139 05/26/2021   K 4.8 05/26/2021   CO2 25 05/26/2021   GLUCOSE 152 (H) 05/26/2021   BUN 14 05/26/2021   CREATININE 1.13 05/26/2021   CALCIUM 9.6 05/26/2021   EGFR 72 05/26/2021   GFRNONAA >60 03/30/2021   Lab Results  Component Value Date   CHOL 159 05/26/2021   HDL 58 05/26/2021   LDLCALC 88 05/26/2021   TRIG 69 05/26/2021   CHOLHDL 2.7 05/26/2021   Lab Results  Component Value Date   TSH 1.510 08/06/2020   Lab Results  Component Value Date   HGBA1C 8.1 (H) 05/26/2021   Lab Results  Component Value Date   WBC 10.1 06/22/2021   HGB 14.4 06/22/2021   HCT  43.2 06/22/2021   MCV 88.2 06/22/2021   PLT 374 06/22/2021   Lab Results  Component Value Date   ALT 19 05/26/2021   AST 13 05/26/2021   ALKPHOS 124 (H) 05/26/2021   BILITOT 0.3 05/26/2021   No results found for: 25OHVITD2, 25OHVITD3, VD25OH   Review of Systems  Constitutional:  Negative for chills, fatigue and fever.  Respiratory:  Positive for shortness of breath. Negative for cough, chest tightness and wheezing.   Cardiovascular:  Negative for chest pain, palpitations and leg swelling.  Gastrointestinal:  Negative for diarrhea, nausea and vomiting.  Neurological:  Positive for tremors. Negative for dizziness and headaches.  Psychiatric/Behavioral:  Positive for sleep disturbance. Negative for dysphoric mood. The patient is not nervous/anxious.    Patient Active Problem List   Diagnosis Date Noted   History of gross hematuria 01/05/2021   Centrilobular emphysema ([place]) 12/11/2019   Personal history of colonic polyps    Aortic atherosclerosis ([place]) 08/27/2019   Dyspepsia 08/06/2018   Acute bilateral low back pain with right-sided sciatica 11/29/2017   Mood disorder ([place]) 11/15/2016   BPH with obstruction/lower urinary tract symptoms 06/15/2015   Hyperlipidemia due to type 1 diabetes mellitus ([place]) 06/15/2015   Insomnia, persistent 03/06/2015   Compulsive tobacco user syndrome 03/06/2015   Type 1 diabetes mellitus with sensory neuropathy ([place]) 03/06/2015   Presence of insulin pump 06/08/2014    Allergies  Allergen Reactions   Lisinopril Cough    Past  Surgical History:  Procedure Laterality Date   CATARACT EXTRACTION, BILATERAL     COLONOSCOPY  08/14/2013   7 tubular adenomas   COLONOSCOPY WITH PROPOFOL N/A 09/10/2019   Procedure: COLONOSCOPY WITH PROPOFOL;  Surgeon: [name], MD;  Location: [facility] ENDOSCOPY;  Service: Endoscopy;  Laterality: N/A;   COLONOSCOPY WITH PROPOFOL N/A 09/11/2019   Procedure: COLONOSCOPY WITH PROPOFOL;  Surgeon: Jonathon Bellows, MD;  Location:  [facility] ENDOSCOPY;  Service: Gastroenterology;  Laterality: N/A;   HOLEP-LASER ENUCLEATION OF THE PROSTATE WITH MORCELLATION N/A 04/02/2021   Procedure: HOLEP-LASER ENUCLEATION OF THE PROSTATE WITH MORCELLATION;  Surgeon: Billey Co, MD;  Location: [facility] ORS;  Service: Urology;  Laterality: N/A;    Social History   Tobacco Use   Smoking status: Former    Packs/day: 0.50    Years: 47.00    Pack years: 23.50    Types: Cigarettes    Start date: 05/12/1968    Quit date: 06/08/2021    Years since quitting: 0.1   Smokeless tobacco: Never  Vaping Use   Vaping Use: Never used  Substance Use Topics   Alcohol use: No    Alcohol/week: 0.0 standard drinks   Drug use: No     Medication list has been reviewed and updated.  Current Meds  Medication Sig   aspirin EC 81 MG tablet Take 81 mg by mouth daily. Swallow whole.   BAYER CONTOUR NEXT TEST test strip    buPROPion (WELLBUTRIN SR) 150 MG 12 hr tablet TAKE 1 TABLET BY MOUTH EVERY MORNING AND2 TABLETS AT BEDTIME   Continuous Blood Gluc Sensor (DEXCOM G6 SENSOR) MISC by Does not apply route.   Cyanocobalamin 5000 MCG/ML LIQD Place under the tongue daily. Liquid   fluticasone-salmeterol (ADVAIR HFA) 45-21 MCG/ACT inhaler Inhale 2 puffs into the lungs 2 (two) times daily. (Patient taking differently: Inhale 2 puffs into the lungs daily.)   gabapentin (NEURONTIN) 300 MG capsule TAKE 2 CAPULES BY MOUTH THREE TIMES DAILY   HUMALOG 100 UNIT/ML injection Inject 80 Units into the skin See admin instructions. 80 units per day through insulin pump   losartan (COZAAR) 25 MG tablet TAKE 1 TABLET BY MOUTH ONCE DAILY   mirabegron ER (MYRBETRIQ) 50 MG TB24 tablet Take 1 tablet (50 mg total) by mouth daily.   pravastatin (PRAVACHOL) 40 MG tablet TAKE 1 TABLET BY MOUTH DAILY   VITAMIN D PO Take 5,000 Units by mouth daily.   zolpidem (AMBIEN) 10 MG tablet Take 1 tablet (10 mg total) by mouth at bedtime.    PHQ 2/9 Scores 07/26/2021 06/22/2021 05/26/2021  05/12/2021  PHQ - 2 Score 0 0 2 4  PHQ- 9 Score 3 0 7 15    GAD 7 : Generalized Anxiety Score 07/26/2021 06/22/2021 05/26/2021 05/12/2021  Nervous, Anxious, on Edge 1 0 0 0  Control/stop worrying 1 0 0 0  Worry too much - different things 1 0 0 0  Trouble relaxing 1 0 0 2  Restless 0 0 0 0  Easily annoyed or irritable 0 0 0 0  Afraid - awful might happen 0 0 0 0  Total GAD 7 Score 4 0 0 2  Anxiety Difficulty Not difficult at all Not difficult at all Not difficult at all Somewhat difficult    BP Readings from Last 3 Encounters:  07/26/21 122/80  06/22/21 (!) 162/84  06/10/21 135/74    Physical Exam Vitals and nursing note reviewed.  Constitutional:      General: He is not in  acute distress.    Appearance: Normal appearance. He is well-developed.  HENT:     Head: Normocephalic and atraumatic.  Cardiovascular:     Rate and Rhythm: Normal rate and regular rhythm.     Pulses: Normal pulses.     Heart sounds: No murmur heard. Pulmonary:     Effort: Pulmonary effort is normal. No respiratory distress.     Breath sounds: No wheezing or rhonchi.  Musculoskeletal:     Cervical back: Normal range of motion.     Right lower leg: No edema.     Left lower leg: No edema.  Skin:    General: Skin is warm and dry.     Capillary Refill: Capillary refill takes less than 2 seconds.     Findings: No rash.  Neurological:     General: No focal deficit present.     Mental Status: He is alert and oriented to person, place, and time.     Sensory: Sensation is intact.     Motor: Tremor (very fine tremor UE's with action; no tremor at rest, no head tremor) present.  Psychiatric:        Mood and Affect: Mood normal.        Behavior: Behavior normal.    Wt Readings from Last 3 Encounters:  07/26/21 188 lb (85.3 kg)  06/22/21 195 lb (88.5 kg)  06/10/21 179 lb (81.2 kg)    BP 122/80    Pulse 87    Ht $R'5\' 7"'AA$  (1.702 m)    Wt 188 lb (85.3 kg)    SpO2 97%    BMI 29.44 kg/m   Assessment and  Plan: 1. Centrilobular emphysema ([place]) Recovered well from Covid-19 and CAP Continue Advair daily; albuterol PRN - fluticasone-salmeterol (ADVAIR HFA) 45-21 MCG/ACT inhaler; Inhale 2 puffs into the lungs 2 (two) times daily.  Dispense: 1 each; Refill: 5 - albuterol (VENTOLIN HFA) 108 (90 Base) MCG/ACT inhaler; Inhale 2 puffs into the lungs every 6 (six) hours as needed for wheezing or shortness of breath.  Dispense: 18 g; Refill: 0  2. Proteinuria, unspecified type Tolerating losartan without issues - losartan (COZAAR) 25 MG tablet; Take 1 tablet (25 mg total) by mouth daily.  Dispense: 90 tablet; Refill: 1  3. Aortic atherosclerosis (HCC) Continue statin therapy  4. Type 1 diabetes mellitus with sensory neuropathy (HCC) Followed by Endo on insulin pump. Last A1C 8.1  5. Benign essential tremor Appears benign - pt to monitor for worsening.   Partially dictated using Editor, commissioning. Any errors are unintentional.  Halina Maidens, MD [facility]  07/26/2021

## 2021-07-28 DIAGNOSIS — E1065 Type 1 diabetes mellitus with hyperglycemia: Secondary | ICD-10-CM | POA: Diagnosis not present

## 2021-08-05 DIAGNOSIS — M5126 Other intervertebral disc displacement, lumbar region: Secondary | ICD-10-CM | POA: Diagnosis not present

## 2021-08-05 DIAGNOSIS — M5416 Radiculopathy, lumbar region: Secondary | ICD-10-CM | POA: Diagnosis not present

## 2021-08-16 DIAGNOSIS — E1165 Type 2 diabetes mellitus with hyperglycemia: Secondary | ICD-10-CM | POA: Diagnosis not present

## 2021-08-25 ENCOUNTER — Other Ambulatory Visit: Payer: Self-pay | Admitting: Internal Medicine

## 2021-08-25 DIAGNOSIS — I700 Atherosclerosis of aorta: Secondary | ICD-10-CM

## 2021-08-25 DIAGNOSIS — E1040 Type 1 diabetes mellitus with diabetic neuropathy, unspecified: Secondary | ICD-10-CM

## 2021-08-25 DIAGNOSIS — F39 Unspecified mood [affective] disorder: Secondary | ICD-10-CM

## 2021-08-26 NOTE — Telephone Encounter (Signed)
Requested Prescriptions  ?Pending Prescriptions Disp Refills  ?? buPROPion (WELLBUTRIN SR) 150 MG 12 hr tablet [Pharmacy Med Name: BUPROPION HCL ER (SR) 150 MG TAB] 90 tablet 0  ?  Sig: TAKE 1 TABLET BY MOUTH EVERY MORNING AND2 TABLETS AT BEDTIME  ?  ? Psychiatry: Antidepressants - bupropion Passed - 08/25/2021 11:44 AM  ?  ?  Passed - Cr in normal range and within 360 days  ?  Creatinine, Ser  ?Date Value Ref Range Status  ?05/26/2021 1.13 0.76 - 1.27 mg/dL Final  ?   ?  ?  Passed - AST in normal range and within 360 days  ?  AST  ?Date Value Ref Range Status  ?05/26/2021 13 0 - 40 IU/L Final  ?   ?  ?  Passed - ALT in normal range and within 360 days  ?  ALT  ?Date Value Ref Range Status  ?05/26/2021 19 0 - 44 IU/L Final  ?   ?  ?  Passed - Last BP in normal range  ?  BP Readings from Last 1 Encounters:  ?07/26/21 122/80  ?   ?  ?  Passed - Valid encounter within last 6 months  ?  Recent Outpatient Visits   ?      ? 1 month ago Centrilobular emphysema ([place])  ? [facility] Glean Hess, MD  ? 2 months ago Community acquired pneumonia, unspecified laterality  ? [facility] Glean Hess, MD  ? 3 months ago Annual physical exam  ? [facility] Glean Hess, MD  ? 3 months ago Community acquired pneumonia, unspecified laterality  ? [facility] Glean Hess, MD  ? 4 months ago COVID-19 virus infection  ? [facility] Glean Hess, MD  ?  ?  ?Future Appointments   ?        ? In 9 months Glean Hess, MD [facility]  ?  ? ?  ?  ?  ?? pravastatin (PRAVACHOL) 40 MG tablet [Pharmacy Med Name: PRAVASTATIN SODIUM 40 MG TAB] 90 tablet 0  ?  Sig: TAKE 1 TABLET BY MOUTH DAILY  ?  ? Cardiovascular:  Antilipid - Statins Failed - 08/25/2021 11:44 AM  ?  ?  Failed - Lipid Panel in normal range within the last 12 months  ?  Cholesterol, Total  ?Date Value Ref Range Status  ?05/26/2021 159 100 - 199 mg/dL Final  ? ?LDL Chol Calc (NIH)  ?Date  Value Ref Range Status  ?05/26/2021 88 0 - 99 mg/dL Final  ? ?HDL  ?Date Value Ref Range Status  ?05/26/2021 58 >39 mg/dL Final  ? ?Triglycerides  ?Date Value Ref Range Status  ?05/26/2021 69 0 - 149 mg/dL Final  ? ?  ?  ?  Passed - Patient is not pregnant  ?  ?  Passed - Valid encounter within last 12 months  ?  Recent Outpatient Visits   ?      ? 1 month ago Centrilobular emphysema ([place])  ? [facility] Glean Hess, MD  ? 2 months ago Community acquired pneumonia, unspecified laterality  ? [facility] Glean Hess, MD  ? 3 months ago Annual physical exam  ? [facility] Glean Hess, MD  ? 3 months ago Community acquired pneumonia, unspecified laterality  ? [facility] Glean Hess, MD  ? 4 months ago COVID-19 virus infection  ?  [facility] Glean Hess, MD  ?  ?  ?Future Appointments   ?        ? In 9 months Glean Hess, MD [facility]  ?  ? ?  ?  ?  ?? gabapentin (NEURONTIN) 300 MG capsule [Pharmacy Med Name: GABAPENTIN 300 MG CAP] 540 capsule 0  ?  Sig: TAKE 2 CAPULES BY MOUTH THREE TIMES DAILY  ?  ? Neurology: Anticonvulsants - gabapentin Passed - 08/25/2021 11:44 AM  ?  ?  Passed - Cr in normal range and within 360 days  ?  Creatinine, Ser  ?Date Value Ref Range Status  ?05/26/2021 1.13 0.76 - 1.27 mg/dL Final  ?   ?  ?  Passed - Completed PHQ-2 or PHQ-9 in the last 360 days  ?  ?  Passed - Valid encounter within last 12 months  ?  Recent Outpatient Visits   ?      ? 1 month ago Centrilobular emphysema ([place])  ? [facility] Glean Hess, MD  ? 2 months ago Community acquired pneumonia, unspecified laterality  ? [facility] Glean Hess, MD  ? 3 months ago Annual physical exam  ? [facility] Glean Hess, MD  ? 3 months ago Community acquired pneumonia, unspecified laterality  ? [facility] Glean Hess, MD  ? 4 months ago COVID-19 virus infection  ? [facility] Glean Hess, MD  ?  ?  ?Future Appointments   ?        ? In 9 months Glean Hess, MD [facility], [place]  ?  ? ?  ?  ?  ? ? ?

## 2021-08-30 DIAGNOSIS — E1065 Type 1 diabetes mellitus with hyperglycemia: Secondary | ICD-10-CM | POA: Diagnosis not present

## 2021-08-31 ENCOUNTER — Telehealth: Payer: Self-pay | Admitting: *Deleted

## 2021-08-31 NOTE — Telephone Encounter (Signed)
Left message for pt to call back to schedule follow up lung screening CT scan.  ?

## 2021-09-06 DIAGNOSIS — E1065 Type 1 diabetes mellitus with hyperglycemia: Secondary | ICD-10-CM | POA: Diagnosis not present

## 2021-09-06 DIAGNOSIS — E1040 Type 1 diabetes mellitus with diabetic neuropathy, unspecified: Secondary | ICD-10-CM | POA: Diagnosis not present

## 2021-09-06 DIAGNOSIS — E1059 Type 1 diabetes mellitus with other circulatory complications: Secondary | ICD-10-CM | POA: Diagnosis not present

## 2021-09-06 DIAGNOSIS — E782 Mixed hyperlipidemia: Secondary | ICD-10-CM | POA: Diagnosis not present

## 2021-09-06 DIAGNOSIS — Z4681 Encounter for fitting and adjustment of insulin pump: Secondary | ICD-10-CM | POA: Diagnosis not present

## 2021-09-06 DIAGNOSIS — E10649 Type 1 diabetes mellitus with hypoglycemia without coma: Secondary | ICD-10-CM | POA: Diagnosis not present

## 2021-09-11 ENCOUNTER — Other Ambulatory Visit: Payer: Self-pay | Admitting: Internal Medicine

## 2021-09-11 DIAGNOSIS — G4700 Insomnia, unspecified: Secondary | ICD-10-CM

## 2021-09-13 NOTE — Telephone Encounter (Signed)
Please review. Last office visit 07/26/21. ? ?KP

## 2021-09-13 NOTE — Telephone Encounter (Signed)
Requested medication (s) are due for refill today: yes ? ?Requested medication (s) are on the active medication list: yes ? ?Last refill:  03/19/21 ? ?Future visit scheduled: yes ? ?Notes to clinic:  Unable to refill per protocol, cannot delegate. ? ? ? ?  ?Requested Prescriptions  ?Pending Prescriptions Disp Refills  ? zolpidem (AMBIEN) 10 MG tablet [Pharmacy Med Name: ZOLPIDEM TARTRATE 10 MG TAB] 30 tablet   ?  Sig: TAKE 1 TABLET BY MOUTH AT BEDTIME.  ?  ? Not Delegated - Psychiatry:  Anxiolytics/Hypnotics Failed - 09/11/2021 11:51 AM  ?  ?  Failed - This refill cannot be delegated  ?  ?  Failed - Urine Drug Screen completed in last 360 days  ?  ?  Passed - Valid encounter within last 6 months  ?  Recent Outpatient Visits   ? ?      ? 1 month ago Centrilobular emphysema ([place])  ? [facility] Glean Hess, MD  ? 2 months ago Community acquired pneumonia, unspecified laterality  ? [facility] Glean Hess, MD  ? 3 months ago Annual physical exam  ? [facility] Glean Hess, MD  ? 4 months ago Community acquired pneumonia, unspecified laterality  ? [facility] Glean Hess, MD  ? 4 months ago COVID-19 virus infection  ? [facility] Glean Hess, MD  ? ?  ?  ?Future Appointments   ? ?        ? In 8 months Army Melia Jesse Sans, MD [facility], [place]  ? ?  ? ? ?  ?  ?  ? ? ?

## 2021-09-15 DIAGNOSIS — E1165 Type 2 diabetes mellitus with hyperglycemia: Secondary | ICD-10-CM | POA: Diagnosis not present

## 2021-10-16 DIAGNOSIS — E1165 Type 2 diabetes mellitus with hyperglycemia: Secondary | ICD-10-CM | POA: Diagnosis not present

## 2021-11-08 ENCOUNTER — Encounter: Payer: Self-pay | Admitting: Urology

## 2021-11-15 DIAGNOSIS — E1165 Type 2 diabetes mellitus with hyperglycemia: Secondary | ICD-10-CM | POA: Diagnosis not present

## 2021-11-19 ENCOUNTER — Other Ambulatory Visit: Payer: Self-pay | Admitting: Internal Medicine

## 2021-11-19 DIAGNOSIS — I700 Atherosclerosis of aorta: Secondary | ICD-10-CM

## 2021-11-19 DIAGNOSIS — F39 Unspecified mood [affective] disorder: Secondary | ICD-10-CM

## 2021-11-19 NOTE — Telephone Encounter (Signed)
Requested Prescriptions  Pending Prescriptions Disp Refills  . pravastatin (PRAVACHOL) 40 MG tablet [Pharmacy Med Name: PRAVASTATIN SODIUM 40 MG TAB] 90 tablet 0    Sig: TAKE 1 TABLET BY MOUTH DAILY     Cardiovascular:  Antilipid - Statins Failed - 11/19/2021  3:48 PM      Failed - Lipid Panel in normal range within the last 12 months    Cholesterol, Total  Date Value Ref Range Status  05/26/2021 159 100 - 199 mg/dL Final   LDL Chol Calc (NIH)  Date Value Ref Range Status  05/26/2021 88 0 - 99 mg/dL Final   HDL  Date Value Ref Range Status  05/26/2021 58 >39 mg/dL Final   Triglycerides  Date Value Ref Range Status  05/26/2021 69 0 - 149 mg/dL Final         Passed - Patient is not pregnant      Passed - Valid encounter within last 12 months    Recent Outpatient Visits          3 months ago Centrilobular emphysema ([place])   [facility] Glean Hess, MD   5 months ago Community acquired pneumonia, unspecified laterality   [facility] Glean Hess, MD   5 months ago Annual physical exam   [facility] Glean Hess, MD   6 months ago Community acquired pneumonia, unspecified laterality   [facility] Glean Hess, MD   7 months ago COVID-19 virus infection   [facility] Glean Hess, MD      Future Appointments            In 6 months Army Melia, Jesse Sans, MD [facility]           . buPROPion [facility] SR) 150 MG 12 hr tablet [Pharmacy Med Name: BUPROPION HCL ER (SR) 150 MG TAB] 90 tablet 0    Sig: TAKE 1 TABLET BY MOUTH EVERY MORNING AND2 TABLETS AT BEDTIME     Psychiatry: Antidepressants - bupropion Passed - 11/19/2021  3:48 PM      Passed - Cr in normal range and within 360 days    Creatinine, Ser  Date Value Ref Range Status  05/26/2021 1.13 0.76 - 1.27 mg/dL Final         Passed - AST in normal range and within 360 days    AST  Date Value Ref Range Status  05/26/2021 13 0 -  40 IU/L Final         Passed - ALT in normal range and within 360 days    ALT  Date Value Ref Range Status  05/26/2021 19 0 - 44 IU/L Final         Passed - Last BP in normal range    BP Readings from Last 1 Encounters:  07/26/21 122/80         Passed - Valid encounter within last 6 months    Recent Outpatient Visits          3 months ago Centrilobular emphysema [facility])   [place], Laura H, MD   5 months ago Community acquired pneumonia, unspecified laterality   [facility] Glean Hess, MD   5 months ago Annual physical exam   [facility] Glean Hess, MD   6 months ago Community acquired pneumonia, unspecified laterality   [facility] Glean Hess, MD   7 months ago COVID-19 virus infection  [facility] Glean Hess, MD      Future Appointments            In 6 months Army Melia Jesse Sans, MD [facility], [facility]

## 2021-12-08 ENCOUNTER — Ambulatory Visit: Payer: Medicare Other | Admitting: Urology

## 2021-12-09 ENCOUNTER — Ambulatory Visit: Payer: Medicare Other | Admitting: Urology

## 2021-12-09 ENCOUNTER — Encounter: Payer: Self-pay | Admitting: Urology

## 2021-12-09 VITALS — BP 132/82 | HR 82 | Ht 67.0 in | Wt 190.0 lb

## 2021-12-09 DIAGNOSIS — E10649 Type 1 diabetes mellitus with hypoglycemia without coma: Secondary | ICD-10-CM | POA: Diagnosis not present

## 2021-12-09 DIAGNOSIS — N529 Male erectile dysfunction, unspecified: Secondary | ICD-10-CM

## 2021-12-09 DIAGNOSIS — N401 Enlarged prostate with lower urinary tract symptoms: Secondary | ICD-10-CM | POA: Diagnosis not present

## 2021-12-09 DIAGNOSIS — N138 Other obstructive and reflux uropathy: Secondary | ICD-10-CM | POA: Diagnosis not present

## 2021-12-09 DIAGNOSIS — E1059 Type 1 diabetes mellitus with other circulatory complications: Secondary | ICD-10-CM | POA: Diagnosis not present

## 2021-12-09 DIAGNOSIS — E1040 Type 1 diabetes mellitus with diabetic neuropathy, unspecified: Secondary | ICD-10-CM | POA: Diagnosis not present

## 2021-12-09 DIAGNOSIS — E1065 Type 1 diabetes mellitus with hyperglycemia: Secondary | ICD-10-CM | POA: Diagnosis not present

## 2021-12-09 DIAGNOSIS — Z4681 Encounter for fitting and adjustment of insulin pump: Secondary | ICD-10-CM | POA: Diagnosis not present

## 2021-12-09 DIAGNOSIS — E782 Mixed hyperlipidemia: Secondary | ICD-10-CM | POA: Diagnosis not present

## 2021-12-09 DIAGNOSIS — F17210 Nicotine dependence, cigarettes, uncomplicated: Secondary | ICD-10-CM | POA: Diagnosis not present

## 2021-12-09 LAB — BLADDER SCAN AMB NON-IMAGING

## 2021-12-09 MED ORDER — TADALAFIL 5 MG PO TABS: 5.0000 mg | ORAL_TABLET | Freq: Every day | ORAL | 3 refills | Status: DC | PRN

## 2021-12-09 NOTE — Patient Instructions (Signed)
Erectile Dysfunction ?Erectile dysfunction (ED) is the inability to get or keep an erection in order to have sexual intercourse. ED is considered a symptom of an underlying disorder and is not considered a disease. ED may include: ?Inability to get an erection. ?Lack of enough hardness of the erection to allow penetration. ?Loss of erection before sex is finished. ?What are the causes? ?This condition may be caused by: ?Physical causes, such as: ?Artery problems. This may include heart disease, high blood pressure, atherosclerosis, and diabetes. ?Hormonal problems, such as low testosterone. ?Obesity. ?Nerve problems. This may include back or pelvic injuries, multiple sclerosis, Parkinson's disease, spinal cord injury, and stroke. ?Certain medicines, such as: ?Pain relievers. ?Antidepressants. ?Blood pressure medicines and water pills (diuretics). ?Cancer medicines. ?Antihistamines. ?Muscle relaxants. ?Lifestyle factors, such as: ?Use of drugs such as marijuana, cocaine, or opioids. ?Excessive use of alcohol. ?Smoking. ?Lack of physical activity or exercise. ?Psychological causes, such as: ?Anxiety or stress. ?Sadness or depression. ?Exhaustion. ?Fear about sexual performance. ?Guilt. ?What are the signs or symptoms? ?Symptoms of this condition include: ?Inability to get an erection. ?Lack of enough hardness of the erection to allow penetration. ?Loss of the erection before sex is finished. ?Sometimes having normal erections, but with frequent unsatisfactory episodes. ?Low sexual satisfaction in either partner due to erection problems. ?A curved penis occurring with erection. The curve may cause pain, or the penis may be too curved to allow for intercourse. ?Never having nighttime or morning erections. ?How is this diagnosed? ?This condition is often diagnosed by: ?Performing a physical exam to find other diseases or specific problems with the penis. ?Asking you detailed questions about the problem. ?Doing tests,  such as: ?Blood tests to check for diabetes mellitus or high cholesterol, or to measure hormone levels. ?Other tests to check for underlying health conditions. ?An ultrasound exam to check for scarring. ?A test to check blood flow to the penis. ?Doing a sleep study at home to measure nighttime erections. ?How is this treated? ?This condition may be treated by: ?Medicines, such as: ?Medicine taken by mouth to help you achieve an erection (oral medicine). ?Hormone replacement therapy to replace low testosterone levels. ?Medicine that is injected into the penis. Your health care provider may instruct you how to give yourself these injections at home. ?Medicine that is delivered with a short applicator tube. The tube is inserted into the opening at the tip of the penis, which is the opening of the urethra. A tiny pellet of medicine is put in the urethra. The pellet dissolves and enhances erectile function. This is also called MUSE (medicated urethral system for erections) therapy. ?Vacuum pump. This is a pump with a ring on it. The pump and ring are placed on the penis and used to create pressure that helps the penis become erect. ?Penile implant surgery. In this procedure, you may receive: ?An inflatable implant. This consists of cylinders, a pump, and a reservoir. The cylinders can be inflated with a fluid that helps to create an erection, and they can be deflated after intercourse. ?A semi-rigid implant. This consists of two silicone rubber rods. The rods provide some rigidity. They are also flexible, so the penis can both curve downward in its normal position and become straight for sexual intercourse. ?Blood vessel surgery to improve blood flow to the penis. During this procedure, a blood vessel from a different part of the body is placed into the penis to allow blood to flow around (bypass) damaged or blocked blood vessels. ?Lifestyle changes,   such as exercising more, losing weight, and quitting smoking. ?Follow  these instructions at home: ?Medicines ? ?Take over-the-counter and prescription medicines only as told by your health care provider. Do not increase the dosage without first discussing it with your health care provider. ?If you are using self-injections, do injections as directed by your health care provider. Make sure you avoid any veins that are on the surface of the penis. After giving an injection, apply pressure to the injection site for 5 minutes. ?Talk to your health care provider about how to prevent headaches while taking ED medicines. These medicines may cause a sudden headache due to the increase in blood flow in your body. ?General instructions ?Exercise regularly, as directed by your health care provider. Work with your health care provider to lose weight, if needed. ?Do not use any products that contain nicotine or tobacco. These products include cigarettes, chewing tobacco, and vaping devices, such as e-cigarettes. If you need help quitting, ask your health care provider. ?Before using a vacuum pump, read the instructions that come with the pump and discuss any questions with your health care provider. ?Keep all follow-up visits. This is important. ?Contact a health care provider if: ?You feel nauseous. ?You are vomiting. ?You get sudden headaches while taking ED medicines. ?You have any concerns about your sexual health. ?Get help right away if: ?You are taking oral or injectable medicines and you have an erection that lasts longer than 4 hours. If your health care provider is unavailable, go to the nearest emergency room for evaluation. An erection that lasts much longer than 4 hours can result in permanent damage to your penis. ?You have severe pain in your groin or abdomen. ?You develop redness or severe swelling of your penis. ?You have redness spreading at your groin or lower abdomen. ?You are unable to urinate. ?You experience chest pain or a rapid heartbeat (palpitations) after taking oral  medicines. ?These symptoms may represent a serious problem that is an emergency. Do not wait to see if the symptoms will go away. Get medical help right away. Call your local emergency services (911 in the [place] Do not drive yourself to the hospital. ?Summary ?Erectile dysfunction (ED) is the inability to get or keep an erection during sexual intercourse. ?This condition is diagnosed based on a physical exam, your symptoms, and tests to determine the cause. Treatment varies depending on the cause and may include medicines, hormone therapy, surgery, or a vacuum pump. ?You may need follow-up visits to make sure that you are using your medicines or devices correctly. ?Get help right away if you are taking or injecting medicines and you have an erection that lasts longer than 4 hours. ?This information is not intended to replace advice given to you by your health care provider. Make sure you discuss any questions you have with your health care provider. ?Document Revised: 07/29/2020 Document Reviewed: 07/29/2020 ?Elsevier Patient Education ? 2023 [employer] ? ?

## 2021-12-09 NOTE — Progress Notes (Signed)
   12/09/2021 1:42 PM   Paul Stokes 1956-05-07 [id number]  Reason for visit: Follow up BPH status post HOLEP, ED  HPI: 66 year old male who presented with symptoms of weak urinary stream, intermittent painless gross hematuria with clots, nocturia 5-6 times overnight, and worsening urinary symptoms if he missed a dose of Flomax.  Work-up showed a 120 g prostate and he underwent an uncomplicated HOLEP on 57/32/2025 with removal of 74 g of benign prostate tissue.  Notably, he has diabetes with a recent hemoglobin A1c of 8.7.  He denies any urinary complaints today and has been doing extremely well, urinating with a strong stream, denies any incontinence, nocturia 0-1 time overnight.  He reports problems with erections over the last 2 to 3 years.  He has never tried medications for this.  No prior testosterone values to review.  AUA guidelines reviewed and risks and benefits discussed, he is interested in trying PDE 5 inhibitor, if no improvement checking a morning testosterone.  We opted for Cialis 5 mg every other day, and we discussed this could be taken as needed or daily/every other day, and dose could be increased to a max of 20 mg.  Trial of Cialis for ED RTC 1 to 2 months symptom check, consider testosterone at that time if persistent ED despite Johnson, MD  [facility] [address] [place], [place] [address] [phone]

## 2021-12-14 ENCOUNTER — Encounter: Payer: Self-pay | Admitting: Internal Medicine

## 2021-12-14 ENCOUNTER — Ambulatory Visit: Payer: Self-pay

## 2021-12-14 NOTE — Telephone Encounter (Signed)
  Chief Complaint: wheezing Symptoms: audible wheezing, moderate SOB, runny nose, O2 sat 91-92% Frequency: 2 days Pertinent Negatives: Patient denies chest pain, dizziness, chest pain or fever Disposition: '[]'$ ED /'[]'$ Urgent Care (no appt availability in office) / '[]'$ Appointment(In office/virtual)/ '[]'$  [facility]/ '[]'$ Home Care/ '[]'$ Refused Recommended Disposition /'[]'$ [facility]/ '[x]'$  Follow-up with PCP Additional Notes: called office and spoke with Estill Bamberg to forward to Dr Gaspar Cola CMA- was advised office will cal pt- Advised pt of the same. Advised to call 911 if worsens Reason for Disposition  Wheezing can be heard across the room  Answer Assessment - Initial Assessment Questions 1. RESPIRATORY STATUS: "Describe your breathing?" (e.g., wheezing, shortness of breath, unable to speak, severe coughing)      wheezing 2. ONSET: "When did this breathing problem begin?"      2 days 3. PATTERN "Does the difficult breathing come and go, or has it been constant since it started?"      Comes and goes 4. SEVERITY: "How bad is your breathing?" (e.g., mild, moderate, severe)    - MILD: No SOB at rest, mild SOB with walking, speaks normally in sentences, can lie down, no retractions, pulse < 100.    - MODERATE: SOB at rest, SOB with minimal exertion and prefers to sit, cannot lie down flat, speaks in phrases, mild retractions, audible wheezing, pulse 100-120.    - SEVERE: Very SOB at rest, speaks in single words, struggling to breathe, sitting hunched forward, retractions, pulse > 120      moderate 5. RECURRENT SYMPTOM: "Have you had difficulty breathing before?" If Yes, ask: "When was the last time?" and "What happened that time?"      Yes-  6. CARDIAC HISTORY: "Do you have any history of heart disease?" (e.g., heart attack, angina, bypass surgery, angioplasty)      no 7. LUNG HISTORY: "Do you have any history of lung disease?"  (e.g., pulmonary embolus, asthma, emphysema)      emphysema 8. CAUSE: "What do you think is causing the breathing problem?"      Runny nose- emphysema 9. OTHER SYMPTOMS: "Do you have any other symptoms? (e.g., dizziness, runny nose, cough, chest pain, fever)     cough 10. O2 SATURATION MONITOR:  "Do you use an oxygen saturation monitor (pulse oximeter) at home?" If Yes, ask: "What is your reading (oxygen level) today?" "What is your usual oxygen saturation reading?" (e.g., 95%)       91-92%  12. TRAVEL: "Have you traveled out of the country in the last month?" (e.g., travel history, exposures)       no  Protocols used: Breathing Difficulty-A-AH

## 2021-12-14 NOTE — Telephone Encounter (Signed)
Spoke to pt let him know to go to the ER. Pt verbalized understanding.  KP

## 2021-12-15 ENCOUNTER — Ambulatory Visit: Payer: Medicare Other | Admitting: Podiatry

## 2021-12-15 DIAGNOSIS — R059 Cough, unspecified: Secondary | ICD-10-CM | POA: Diagnosis not present

## 2021-12-15 DIAGNOSIS — J441 Chronic obstructive pulmonary disease with (acute) exacerbation: Secondary | ICD-10-CM | POA: Diagnosis not present

## 2021-12-16 DIAGNOSIS — E1165 Type 2 diabetes mellitus with hyperglycemia: Secondary | ICD-10-CM | POA: Diagnosis not present

## 2021-12-20 ENCOUNTER — Other Ambulatory Visit: Payer: Medicare Other

## 2021-12-27 ENCOUNTER — Encounter: Payer: Self-pay | Admitting: Podiatry

## 2021-12-27 ENCOUNTER — Ambulatory Visit: Payer: Medicare Other | Admitting: Podiatry

## 2021-12-27 DIAGNOSIS — M722 Plantar fascial fibromatosis: Secondary | ICD-10-CM | POA: Diagnosis not present

## 2021-12-27 MED ORDER — TRIAMCINOLONE ACETONIDE 40 MG/ML IJ SUSP
20.0000 mg | Freq: Once | INTRAMUSCULAR | Status: AC
  Administered 2021-12-27: 20 mg

## 2021-12-27 NOTE — Progress Notes (Signed)
He presents today for follow-up of his Planter fasciitis states that started bothering him not too long ago.  States he did in the hospital with COVID-related pneumonia.  Objective: Vital signs are stable he is alert and oriented 3 pulses are palpable.  There is no erythema edema cellulitis drainage or odor.  He has pain on palpation to the medial and plantar calcaneal tubercles of the left heel.  Assessment: Planter fasciitis left.  Plan: Discussed appropriate shoe gear stretching exercise ice therapy sugar modifications.  Injected his left heel today 20 mg Kenalog 5 mg Marcaine point maximal tenderness.  Tolerated procedure well we will follow-up with him on an as-needed basis.

## 2022-01-03 ENCOUNTER — Ambulatory Visit: Payer: Medicare Other

## 2022-01-06 ENCOUNTER — Other Ambulatory Visit: Payer: Self-pay | Admitting: Internal Medicine

## 2022-01-06 DIAGNOSIS — F39 Unspecified mood [affective] disorder: Secondary | ICD-10-CM

## 2022-01-07 ENCOUNTER — Ambulatory Visit (INDEPENDENT_AMBULATORY_CARE_PROVIDER_SITE_OTHER): Payer: Medicare Other

## 2022-01-07 VITALS — BP 118/72 | HR 85 | Temp 97.6°F | Ht 67.0 in | Wt 185.0 lb

## 2022-01-07 DIAGNOSIS — Z0000 Encounter for general adult medical examination without abnormal findings: Secondary | ICD-10-CM | POA: Diagnosis not present

## 2022-01-07 NOTE — Progress Notes (Signed)
Subjective:   Paul Stokes is a 66 y.o. male who presents for Medicare Annual/Subsequent preventive examination.  Review of Systems    Per HPI unless specifically indicated below  Cardiac Risk Factors include: advanced age (>21mn, >>12women);male gender, hyperlipidemia, Type 1 diabetes, and current smoker.           Objective:    Today's Vitals   01/07/22 0832 01/07/22 0839  BP: 118/72   Pulse: 85   Temp: 97.6 F (36.4 C)   TempSrc: Oral   SpO2: 95%   Weight: 185 lb (83.9 kg)   Height: '5\' 7"'$  (1.702 m)   PainSc: 0-No pain 0-No pain   Body mass index is 28.98 kg/m.     04/02/2021    7:17 AM 03/30/2021    2:14 PM 12/30/2020    8:37 AM 10/03/2019   11:26 AM 09/29/2019    3:55 PM 09/25/2019    7:38 PM 09/11/2019   12:09 PM  Advanced Directives  Does Patient Have a Medical Advance Directive? No No No No No No No  Would patient like information on creating a medical advance directive? No - Patient declined No - Patient declined Yes (MAU/Ambulatory/Procedural Areas - Information given) No - Patient declined  No - Patient declined No - Patient declined    Current Medications (verified) Outpatient Encounter Medications as of 01/07/2022  Medication Sig   albuterol (VENTOLIN HFA) 108 (90 Base) MCG/ACT inhaler Inhale 2 puffs into the lungs every 6 (six) hours as needed for wheezing or shortness of breath.   aspirin EC 81 MG tablet Take 81 mg by mouth daily. Swallow whole.   BAYER CONTOUR NEXT TEST test strip    buPROPion (WELLBUTRIN SR) 150 MG 12 hr tablet TAKE 1 TABLET BY MOUTH EVERY MORNING AND2 TABLETS AT BEDTIME   Continuous Blood Gluc Sensor (DEXCOM G6 SENSOR) MISC by Does not apply route.   Cyanocobalamin 5000 MCG/ML LIQD Place under the tongue daily. Liquid   fluticasone-salmeterol (ADVAIR HFA) 45-21 MCG/ACT inhaler Inhale 2 puffs into the lungs 2 (two) times daily.   gabapentin (NEURONTIN) 300 MG capsule TAKE 2 CAPULES BY MOUTH THREE TIMES DAILY   HUMALOG 100 UNIT/ML  injection Inject 80 Units into the skin See admin instructions. 80 units per day through insulin pump   losartan (COZAAR) 25 MG tablet Take 1 tablet (25 mg total) by mouth daily.   pravastatin (PRAVACHOL) 40 MG tablet TAKE 1 TABLET BY MOUTH DAILY   VITAMIN D PO Take 5,000 Units by mouth daily.   zolpidem (AMBIEN) 10 MG tablet TAKE 1 TABLET BY MOUTH AT BEDTIME.   tadalafil (CIALIS) 5 MG tablet Take 1 tablet (5 mg total) by mouth daily as needed for erectile dysfunction. (Patient not taking: Reported on 01/07/2022)   [DISCONTINUED] mirabegron ER (MYRBETRIQ) 50 MG TB24 tablet Take 1 tablet (50 mg total) by mouth daily. (Patient not taking: Reported on 01/07/2022)   No facility-administered encounter medications on file as of 01/07/2022.    Allergies (verified) Lisinopril   History: Past Medical History:  Diagnosis Date   Depression    Diabetes mellitus without complication ([place]    type 1; insulin pump   Emphysema of lung (HCC)    GERD (gastroesophageal reflux disease)    Hyperlipidemia    Prostate disease    Past Surgical History:  Procedure Laterality Date   CATARACT EXTRACTION, BILATERAL     COLONOSCOPY  08/14/2013   7 tubular adenomas   COLONOSCOPY WITH PROPOFOL N/A 09/10/2019  Procedure: COLONOSCOPY WITH PROPOFOL;  Surgeon: [name], MD;  Location: [facility] ENDOSCOPY;  Service: Endoscopy;  Laterality: N/A;   COLONOSCOPY WITH PROPOFOL N/A 09/11/2019   Procedure: COLONOSCOPY WITH PROPOFOL;  Surgeon: Jonathon Bellows, MD;  Location: [facility] ENDOSCOPY;  Service: Gastroenterology;  Laterality: N/A;   HOLEP-LASER ENUCLEATION OF THE PROSTATE WITH MORCELLATION N/A 04/02/2021   Procedure: HOLEP-LASER ENUCLEATION OF THE PROSTATE WITH MORCELLATION;  Surgeon: Billey Co, MD;  Location: [facility] ORS;  Service: Urology;  Laterality: N/A;   Family History  Problem Relation Age of Onset   Diabetes Mother    Diabetes Father    Social History   Socioeconomic History   Marital status: Married     Spouse name: Lola   Number of children: 5   Years of education: Not on file   Highest education level: 10th grade  Occupational History   Occupation: part-time  Tobacco Use   Smoking status: Some Days    Packs/day: 0.25    Years: 47.00    Total pack years: 11.75    Types: Cigarettes    Start date: 05/12/1968    Last attempt to quit: 06/08/2021    Years since quitting: 0.5    Passive exposure: Past   Smokeless tobacco: Never  Vaping Use   Vaping Use: Never used  Substance and Sexual Activity   Alcohol use: No    Alcohol/week: 0.0 standard drinks of alcohol   Drug use: No   Sexual activity: Not Currently  Other Topics Concern   Not on file  Social History Narrative   Not on file   Social Determinants of Health   Financial Resource Strain: Low Risk  (01/07/2022)   Overall Financial Resource Strain (CARDIA)    Difficulty of Paying Living Expenses: Not hard at all  Food Insecurity: No Food Insecurity (01/07/2022)   Hunger Vital Sign    Worried About Running Out of Food in the Last Year: Never true    Ran Out of Food in the Last Year: Never true  Transportation Needs: No Transportation Needs (01/07/2022)   PRAPARE - Hydrologist (Medical): No    Lack of Transportation (Non-Medical): No  Physical Activity: Insufficiently Active (01/07/2022)   Exercise Vital Sign    Days of Exercise per Week: 3 days    Minutes of Exercise per Session: 30 min  Stress: No Stress Concern Present (01/07/2022)   [place]    Feeling of Stress : Not at all  Social Connections: Moderately Isolated (01/07/2022)   Social Connection and Isolation Panel [NHANES]    Frequency of Communication with Friends and Family: More than three times a week    Frequency of Social Gatherings with Friends and Family: More than three times a week    Attends Religious Services: Never    Marine scientist or Organizations:  No    Attends Archivist Meetings: Never    Marital Status: Married    Tobacco Counseling Ready to quit: Not Answered Counseling given: Not Answered Tobacco dependence. He resumed smoking, now just 1 cig per day at bedtime. He is using a nicotine patch and is hoping to quit.    Clinical Intake:  Pre-visit preparation completed: No  Pain : No/denies pain Pain Score: 0-No pain     Nutritional Status: BMI 25 -29 Overweight Nutritional Risks: None Diabetes: Yes CBG done?: Yes CBG resulted in Enter/ Edit results?: Yes Did pt. bring in CBG  monitor from home?: Yes Glucose Meter Downloaded?: Yes  How often do you need to have someone help you when you read instructions, pamphlets, or other written materials from your doctor or pharmacy?: 1 - Never  Diabetic?Nutrition Risk Assessment:  Has the patient had any N/V/D within the last 2 months?  No  Does the patient have any non-healing wounds?  No  Has the patient had any unintentional weight loss or weight gain?  No   Diabetes:  Is the patient diabetic?  Yes  If diabetic, was a CBG obtained today?  Yes  Did the patient bring in their glucometer from home?  Yes  How often do you monitor your CBG's? Continuously .  He has a Tandem X2 Control IQ pump and DexCom CGM. He changed from the Medtronic insulin pump in 03/2021. His devices were downloaded and reviewed. Pump settings: Basal rates  12 AM 1.1 units/hr Correctio 45 Carb ratio 5 Target 110 Lab Results  Component Value Date  HGBA1C 8.7 (H) 12/09/2021  HGBA1C 9.2 (H) 08/30/2021  Financial Strains and Diabetes Management:  Are you having any financial strains with the device, your supplies or your medication? No .  Does the patient want to be seen by Chronic Care Management for management of their diabetes?  No  Would the patient like to be referred to a Nutritionist or for Diabetic Management?  No   Diabetic Exams:  Diabetic Eye Exam: Overdue for diabetic eye  exam. Pt has been advised about the importance in completing this exam. Patient advised to call and schedule an eye exam. Dr Matilde Sprang  Diabetic Foot Exam: Completed 05/26/2021    Interpreter Needed?: No  Information entered by :: Donnie Mesa, [place]   Activities of Daily Living    01/07/2022    8:41 AM 07/26/2021    1:24 PM  In your present state of health, do you have any difficulty performing the following activities:  Hearing? 0 0  Comment Tinnutis   Vision? 1 0  Difficulty concentrating or making decisions? 1 0  Walking or climbing stairs? 1 0  Comment bilateral leg weakness   Dressing or bathing? 0 0  Doing errands, shopping? 0 0    Patient Care Team: Glean Hess, MD as PCP - General (Internal Medicine) Gabriel Carina, Betsey Holiday, MD as Physician Assistant (Endocrinology) Idelle Leech, OD (Optometry) Billey Co, MD as Consulting Physician (Urology)  Indicate any recent Medical Services you may have received from other than [facility] providers in the past year (date may be approximate).    No hospitalization in the past 12 months.  Assessment:   This is a routine wellness examination for [place].  Hearing/Vision screen Denies any hearing issues, but admits that he does have chronic tinnitus. Wear glasses, no up to date on his Annual Eye Exam, but will call and schedule an appt with Dr. Matilde Sprang.   Dietary issues and exercise activities discussed: Current Exercise Habits: Home exercise routine, Type of exercise: Other - see comments (yard work), Time (Minutes): 30, Frequency (Times/Week): 3, Weekly Exercise (Minutes/Week): 90, Intensity: Mild, Exercise limited by: cardiac condition(s)   Goals Addressed             This Visit's Progress    Stay Active and Independent       Why is this important?   Regular activity or exercise is important to managing back pain.  Activity helps to keep your muscles strong.  You will sleep better and feel more relaxed.  You will have more energy  and feel less stressed.  If you are not active now, start slowly. Little changes make a big difference.  Rest, but not too much.  Stay as active as you can and listen to your body's signals.     Notes: Patient states he would like to maintain current health and activity       Depression Screen    01/07/2022    8:54 AM 01/07/2022    8:34 AM 07/26/2021    1:23 PM 06/22/2021    8:25 AM 05/26/2021    9:23 AM 05/12/2021    9:16 AM 04/23/2021    3:44 PM  PHQ 2/9 Scores  PHQ - 2 Score 0 0 0 0 2 4 0  PHQ- 9 Score 2  3 0 '7 15 2    '$ Fall Risk    01/07/2022    8:41 AM 07/26/2021    1:24 PM 06/22/2021    8:25 AM 05/26/2021    9:23 AM 05/12/2021    9:17 AM  Fall Risk   Falls in the past year? 0 0 0 0 0  Number falls in past yr: 0 0 0 0 0  Injury with Fall? 0 0 0 0 0  Risk for fall due to : No Fall Risks No Fall Risks No Fall Risks No Fall Risks No Fall Risks  Follow up Falls evaluation completed Falls evaluation completed Falls evaluation completed Falls evaluation completed Falls evaluation completed    FALL RISK PREVENTION PERTAINING TO THE HOME:  Any stairs in or around the home? No  If so, are there any without handrails?  No stairs in the home  Home free of loose throw rugs in walkways, pet beds, electrical cords, etc? Yes  Adequate lighting in your home to reduce risk of falls? Yes   ASSISTIVE DEVICES UTILIZED TO PREVENT FALLS:  Life alert? No  Use of a cane, walker or w/c? No  Grab bars in the bathroom? Yes  Shower chair or bench in shower? Yes  Elevated toilet seat or a handicapped toilet? Yes   TIMED UP AND GO:  Was the test performed? Yes .  Length of time to ambulate 10 feet: 10 sec.   Gait steady and fast without use of assistive device  Cognitive Function:        01/07/2022    8:42 AM 12/30/2020    8:44 AM 08/06/2018    9:01 AM  6CIT Screen  What Year? 0 points 0 points 0 points  What month? 0 points 0 points 0 points  What time? 0 points 0 points 0 points   Count back from 20 0 points 0 points 0 points  Months in reverse 0 points 0 points 0 points  Repeat phrase 0 points 0 points 0 points  Total Score 0 points 0 points 0 points    Immunizations Immunization History  Administered Date(s) Administered   Influenza Inj Mdck Quad Pf 02/22/2019   Influenza Split 03/05/2020   Influenza,inj,Quad PF,6+ Mos 03/12/2018   Influenza-Unspecified 03/15/2015, 03/06/2019   PFIZER Comirnaty(Gray Top)Covid-19 Tri-Sucrose Vaccine 07/30/2019, 08/20/2019   PFIZER(Purple Top)SARS-COV-2 Vaccination 07/30/2019, 08/20/2019   Pneumococcal Polysaccharide-23 05/17/2010, 03/05/2020   Zoster, Live 05/23/2013    TDAP status: Due, Education has been provided regarding the importance of this vaccine. Advised may receive this vaccine at local pharmacy or Health Dept. Aware to provide a copy of the vaccination record if obtained from local pharmacy or Health Dept. Verbalized acceptance and understanding.  Flu Vaccine status: Up to date  Pneumococcal vaccine status: Completed during today's visit.  Covid-19 vaccine status: Completed vaccines  Qualifies for Shingles Vaccine? Yes   Zostavax completed No   Shingrix Completed?: No.    Education has been provided regarding the importance of this vaccine. Patient has been advised to call insurance company to determine out of pocket expense if they have not yet received this vaccine. Advised may also receive vaccine at local pharmacy or Health Dept. Verbalized acceptance and understanding.  Screening Tests Health Maintenance  Topic Date Due   TETANUS/TDAP  Never done   Zoster Vaccines- Shingrix (1 of 2) Never done   COVID-19 Vaccine (5 - Pfizer series) 10/15/2019   OPHTHALMOLOGY EXAM  08/17/2021   HEMOGLOBIN A1C  11/23/2021   INFLUENZA VACCINE  12/14/2021   Pneumonia Vaccine 32+ Years old (2 - PCV) 05/12/2022 (Originally 03/05/2021)   FOOT EXAM  05/26/2022   COLONOSCOPY (Pts 45-62yr Insurance coverage will need to be  confirmed)  09/10/2024   Hepatitis C Screening  Completed   HPV VACCINES  Aged Out    Health Maintenance  Health Maintenance Due  Topic Date Due   TETANUS/TDAP  Never done   Zoster Vaccines- Shingrix (1 of 2) Never done   COVID-19 Vaccine (5 - Pfizer series) 10/15/2019   OPHTHALMOLOGY EXAM  08/17/2021   HEMOGLOBIN A1C  11/23/2021   INFLUENZA VACCINE  12/14/2021    Colorectal cancer screening: Type of screening: Colonoscopy. Completed 09/11/2019. Repeat every 5 years  Lung Cancer Screening: (Low Dose CT Chest recommended if Age 228-80years, 30 pack-year currently smoking OR have quit w/in 15years.) does qualify.   Lung Cancer Screening Referral: Pt need to follow up on already scheduled lung screening.   Additional Screening:  Hepatitis C Screening: does qualify; Completed 06/15/2015  Vision Screening: Recommended annual ophthalmology exams for early detection of glaucoma and other disorders of the eye. Is the patient up to date with their annual eye exam?  No Who is the provider or what is the name of the office in which the patient attends annual eye exams? Dr. NMatilde SprangIf pt is not established with a provider, would they like to be referred to a provider to establish care? No .   Dental Screening: Recommended annual dental exams for proper oral hygiene  Community Resource Referral / Chronic Care Management: CRR required this visit?  No   CCM required this visit?  No      Plan:     I have personally reviewed and noted the following in the patient's chart:   Medical and social history Use of alcohol, tobacco or illicit drugs  Current medications and supplements including opioid prescriptions. Patient is not currently taking opioid prescriptions. Functional ability and status Nutritional status Physical activity Advanced directives List of other physicians Hospitalizations, surgeries, and ER visits in previous 12 months Vitals Screenings to include cognitive,  depression, and falls Referrals and appointments  In addition, I have reviewed and discussed with patient certain preventive protocols, quality metrics, and best practice recommendations. A written personalized care plan for preventive services as well as general preventive health recommendations were provided to patient.     Mr. TMccree, Thank you for taking time to come for your Medicare Wellness Visit. I appreciate your ongoing commitment to your health goals. Please review the following plan we discussed and let me know if I can assist you in the future.   These are the goals we discussed:  Goals  Patient Stated     Patient states he would like to maintain current health and activity     Stay Active and Independent     Why is this important?   Regular activity or exercise is important to managing back pain.  Activity helps to keep your muscles strong.  You will sleep better and feel more relaxed.  You will have more energy and feel less stressed.  If you are not active now, start slowly. Little changes make a big difference.  Rest, but not too much.  Stay as active as you can and listen to your body's signals.     Notes: Patient states he would like to maintain current health and activity        This is a list of the screening recommended for you and due dates:  Health Maintenance  Topic Date Due   Tetanus Vaccine  Never done   Zoster (Shingles) Vaccine (1 of 2) Never done   COVID-19 Vaccine (5 - Pfizer series) 10/15/2019   Eye exam for diabetics  08/17/2021   Hemoglobin A1C  11/23/2021   Flu Shot  12/14/2021   Pneumonia Vaccine (2 - PCV) 05/12/2022*   Complete foot exam   05/26/2022   Colon Cancer Screening  09/10/2024   Hepatitis C Screening: USPSTF Recommendation to screen - Ages 18-79 yo.  Completed   HPV Vaccine  Aged Out  *Topic was postponed. The date shown is not the original due date.    Wilson Singer, [place]   01/07/2022   Nurse Notes: 30 minute  face-to-face visit

## 2022-01-07 NOTE — Telephone Encounter (Signed)
Requested Prescriptions  Pending Prescriptions Disp Refills  . buPROPion (WELLBUTRIN SR) 150 MG 12 hr tablet [Pharmacy Med Name: BUPROPION HCL ER (SR) 150 MG TAB] 360 tablet 0    Sig: TAKE 1 TABLET BY MOUTH EVERY MORNING AND2 TABLETS AT BEDTIME     Psychiatry: Antidepressants - bupropion Passed - 01/06/2022  3:42 PM      Passed - Cr in normal range and within 360 days    Creatinine, Ser  Date Value Ref Range Status  05/26/2021 1.13 0.76 - 1.27 mg/dL Final         Passed - AST in normal range and within 360 days    AST  Date Value Ref Range Status  05/26/2021 13 0 - 40 IU/L Final         Passed - ALT in normal range and within 360 days    ALT  Date Value Ref Range Status  05/26/2021 19 0 - 44 IU/L Final         Passed - Last BP in normal range    BP Readings from Last 1 Encounters:  12/09/21 132/82         Passed - Valid encounter within last 6 months    Recent Outpatient Visits          5 months ago Centrilobular emphysema ([place])   [facility], Jesse Sans, MD   6 months ago Community acquired pneumonia, unspecified laterality   [place] [facility] at [facility], Jesse Sans, MD   7 months ago Annual physical exam   [facility] at [facility], Jesse Sans, MD   8 months ago Community acquired pneumonia, unspecified laterality   [facility] at [facility], Jesse Sans, MD   8 months ago COVID-19 virus infection   [facility] at [facility], Jesse Sans, MD      Future Appointments            In 2 weeks [facility] [place], Herbert Seta, MD [place]   In 4 months [facility] Melia, Jesse Sans, MD [facility] at [facility], [facility]

## 2022-01-07 NOTE — Patient Instructions (Signed)
Health Maintenance, Male Adopting a healthy lifestyle and getting preventive care are important in promoting health and wellness. Ask your health care provider about: The right schedule for you to have regular tests and exams. Things you can do on your own to prevent diseases and keep yourself healthy. What should I know about diet, weight, and exercise? Eat a healthy diet  Eat a diet that includes plenty of vegetables, fruits, low-fat dairy products, and lean protein. Do not eat a lot of foods that are high in solid fats, added sugars, or sodium. Maintain a healthy weight Body mass index (BMI) is a measurement that can be used to identify possible weight problems. It estimates body fat based on height and weight. Your health care provider can help determine your BMI and help you achieve or maintain a healthy weight. Get regular exercise Get regular exercise. This is one of the most important things you can do for your health. Most adults should: Exercise for at least 150 minutes each week. The exercise should increase your heart rate and make you sweat (moderate-intensity exercise). Do strengthening exercises at least twice a week. This is in addition to the moderate-intensity exercise. Spend less time sitting. Even light physical activity can be beneficial. Watch cholesterol and blood lipids Have your blood tested for lipids and cholesterol at 67 years of age, then have this test every 5 years. You may need to have your cholesterol levels checked more often if: Your lipid or cholesterol levels are high. You are older than 67 years of age. You are at high risk for heart disease. What should I know about cancer screening? Many types of cancers can be detected early and may often be prevented. Depending on your health history and family history, you may need to have cancer screening at various ages. This may include screening for: Colorectal cancer. Prostate cancer. Skin cancer. Lung  cancer. What should I know about heart disease, diabetes, and high blood pressure? Blood pressure and heart disease High blood pressure causes heart disease and increases the risk of stroke. This is more likely to develop in people who have high blood pressure readings or are overweight. Talk with your health care provider about your target blood pressure readings. Have your blood pressure checked: Every 3-5 years if you are 18-39 years of age. Every year if you are 40 years old or older. If you are between the ages of 65 and 75 and are a current or former smoker, ask your health care provider if you should have a one-time screening for abdominal aortic aneurysm (AAA). Diabetes Have regular diabetes screenings. This checks your fasting blood sugar level. Have the screening done: Once every three years after age 45 if you are at a normal weight and have a low risk for diabetes. More often and at a younger age if you are overweight or have a high risk for diabetes. What should I know about preventing infection? Hepatitis B If you have a higher risk for hepatitis B, you should be screened for this virus. Talk with your health care provider to find out if you are at risk for hepatitis B infection. Hepatitis C Blood testing is recommended for: Everyone born from 1945 through 1965. Anyone with known risk factors for hepatitis C. Sexually transmitted infections (STIs) You should be screened each year for STIs, including gonorrhea and chlamydia, if: You are sexually active and are younger than 67 years of age. You are older than 67 years of age and your   health care provider tells you that you are at risk for this type of infection. Your sexual activity has changed since you were last screened, and you are at increased risk for chlamydia or gonorrhea. Ask your health care provider if you are at risk. Ask your health care provider about whether you are at high risk for HIV. Your health care provider  may recommend a prescription medicine to help prevent HIV infection. If you choose to take medicine to prevent HIV, you should first get tested for HIV. You should then be tested every 3 months for as long as you are taking the medicine. Follow these instructions at home: Alcohol use Do not drink alcohol if your health care provider tells you not to drink. If you drink alcohol: Limit how much you have to 0-2 drinks a day. Know how much alcohol is in your drink. In the [place] one drink equals one 12 oz bottle of beer (355 mL), one 5 oz glass of wine (148 mL), or one 1 oz glass of hard liquor (44 mL). Lifestyle Do not use any products that contain nicotine or tobacco. These products include cigarettes, chewing tobacco, and vaping devices, such as e-cigarettes. If you need help quitting, ask your health care provider. Do not use street drugs. Do not share needles. Ask your health care provider for help if you need support or information about quitting drugs. General instructions Schedule regular health, dental, and eye exams. Stay current with your vaccines. Tell your health care provider if: You often feel depressed. You have ever been abused or do not feel safe at home. Summary Adopting a healthy lifestyle and getting preventive care are important in promoting health and wellness. Follow your health care provider's instructions about healthy diet, exercising, and getting tested or screened for diseases. Follow your health care provider's instructions on monitoring your cholesterol and blood pressure. This information is not intended to replace advice given to you by your health care provider. Make sure you discuss any questions you have with your health care provider. Document Revised: 09/21/2020 Document Reviewed: 09/21/2020 Elsevier Patient Education  2023 [employer] Inc[employer]  

## 2022-01-11 DIAGNOSIS — E1065 Type 1 diabetes mellitus with hyperglycemia: Secondary | ICD-10-CM | POA: Diagnosis not present

## 2022-01-12 ENCOUNTER — Ambulatory Visit: Payer: Medicare Other | Admitting: Podiatry

## 2022-01-16 DIAGNOSIS — E1165 Type 2 diabetes mellitus with hyperglycemia: Secondary | ICD-10-CM | POA: Diagnosis not present

## 2022-01-24 IMAGING — CR DG CHEST 2V
2 series · 2 of 2 positions shown · non-contrast
Comparison: 02/24/2010

CLINICAL DATA: 65-year-old male with a history of cough and
wheezing

EXAM:
CHEST - 2 VIEW

[chest pa]
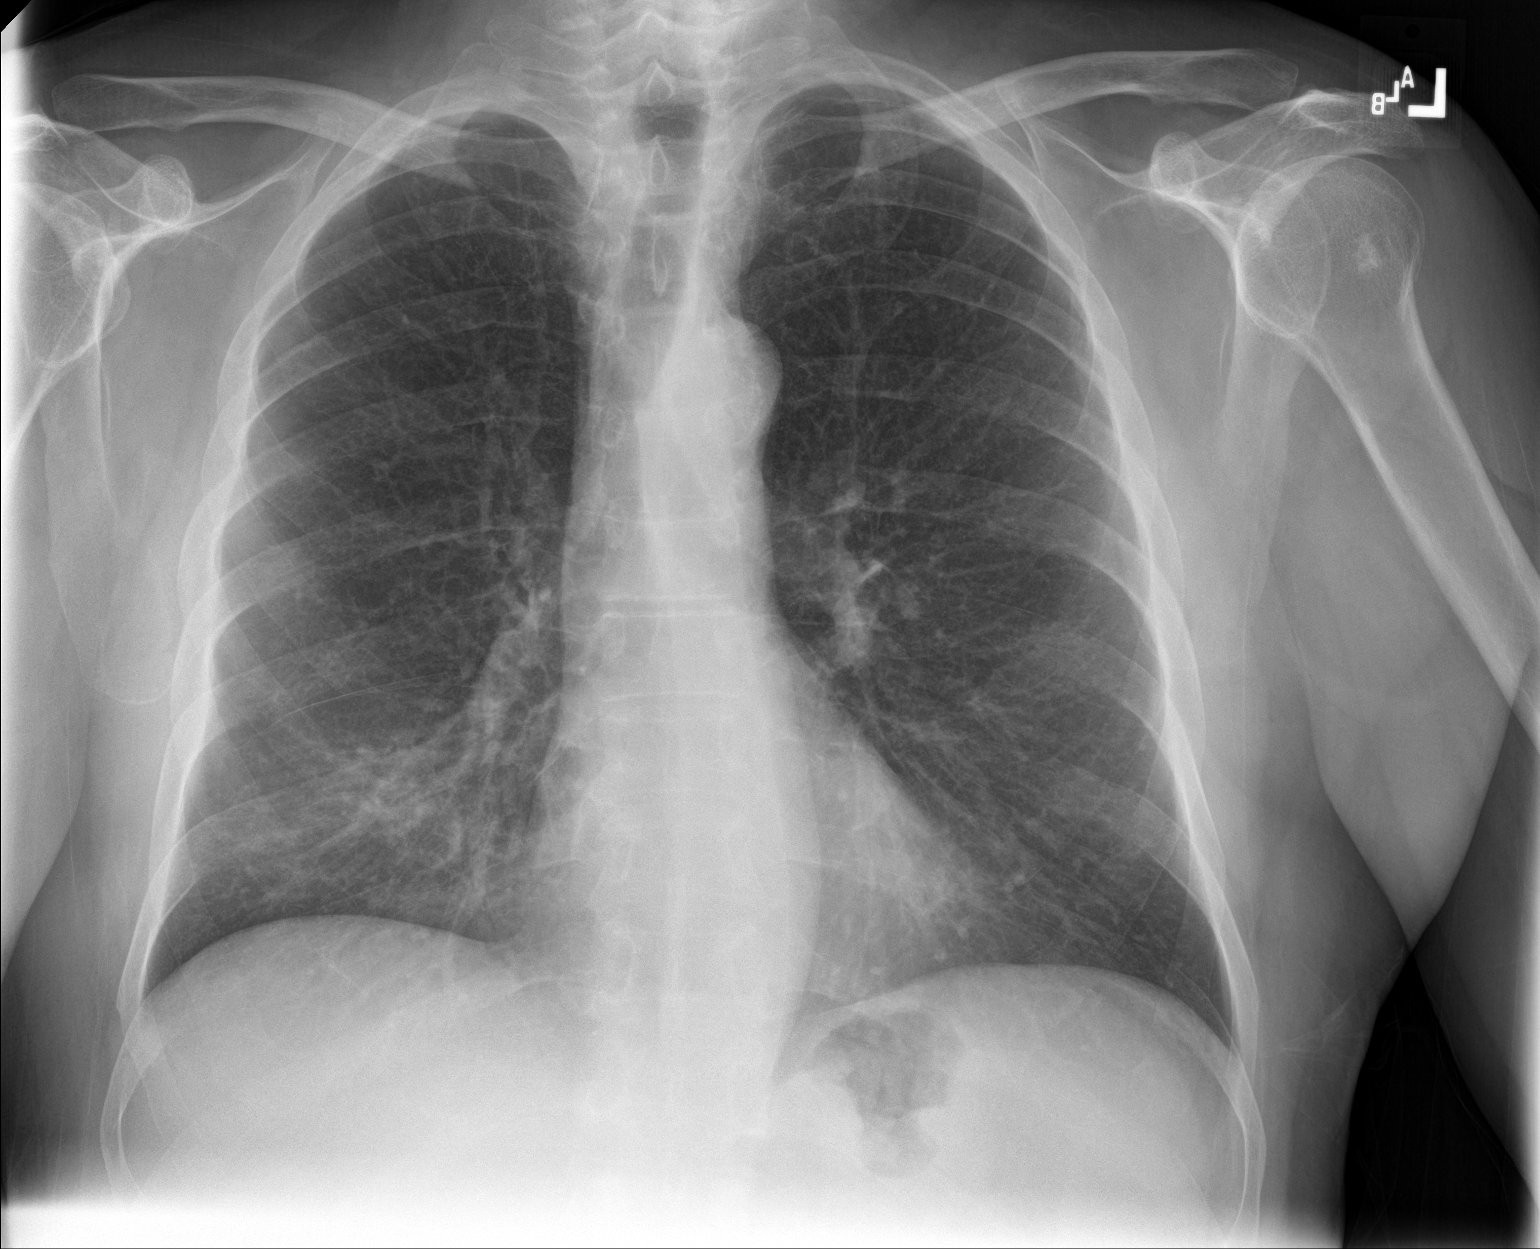

[chest lat]
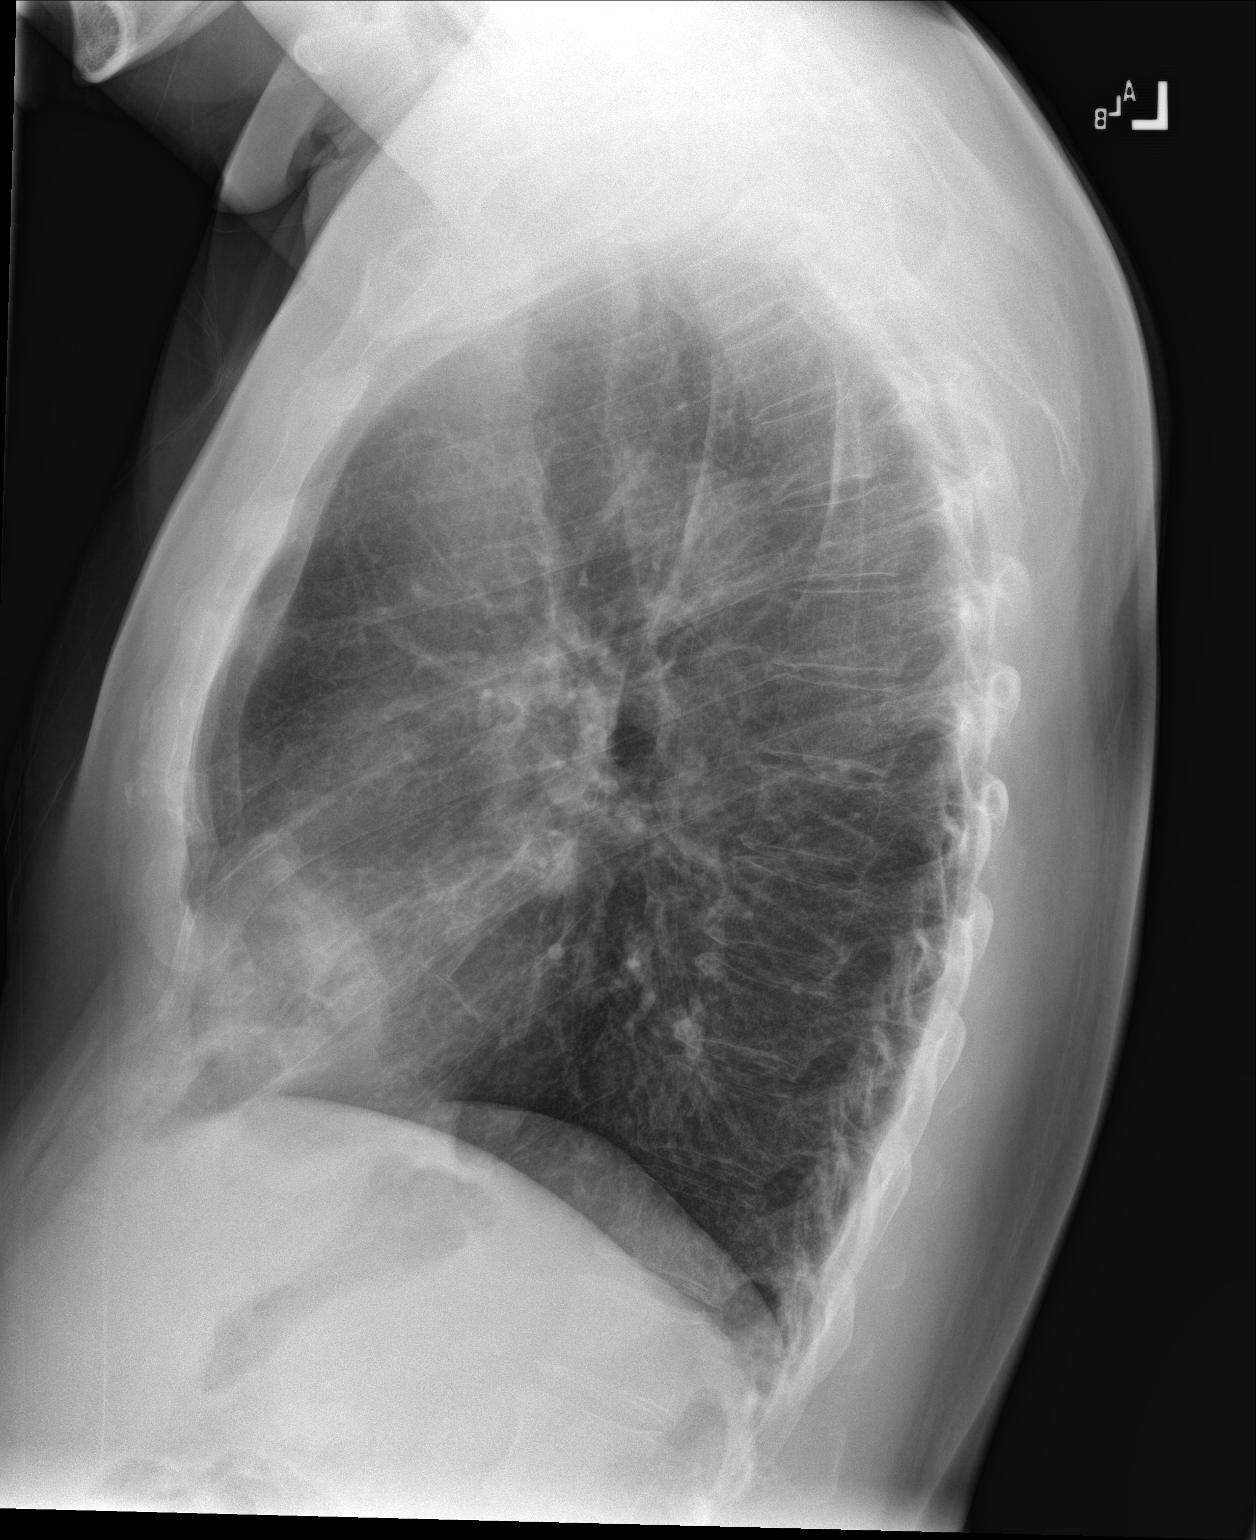

[2 of 2 positions shown; findings below may reference images not displayed]

FINDINGS: Cardiomediastinal silhouette unchanged in size and contour.

No interlobular septal thickening.

Airspace disease of the right middle lobe. No pneumothorax. No
pleural effusion.

No displaced fracture.
IMPRESSION: Lobar pneumonia of the middle lobe

## 2022-01-27 ENCOUNTER — Ambulatory Visit: Payer: Medicare Other | Admitting: Urology

## 2022-01-27 DIAGNOSIS — Z7984 Long term (current) use of oral hypoglycemic drugs: Secondary | ICD-10-CM | POA: Diagnosis not present

## 2022-01-27 DIAGNOSIS — H524 Presbyopia: Secondary | ICD-10-CM | POA: Diagnosis not present

## 2022-01-27 DIAGNOSIS — Z961 Presence of intraocular lens: Secondary | ICD-10-CM | POA: Diagnosis not present

## 2022-01-27 DIAGNOSIS — H52223 Regular astigmatism, bilateral: Secondary | ICD-10-CM | POA: Diagnosis not present

## 2022-01-27 DIAGNOSIS — H26492 Other secondary cataract, left eye: Secondary | ICD-10-CM | POA: Diagnosis not present

## 2022-01-27 DIAGNOSIS — H53001 Unspecified amblyopia, right eye: Secondary | ICD-10-CM | POA: Diagnosis not present

## 2022-01-27 DIAGNOSIS — H5203 Hypermetropia, bilateral: Secondary | ICD-10-CM | POA: Diagnosis not present

## 2022-01-27 DIAGNOSIS — E119 Type 2 diabetes mellitus without complications: Secondary | ICD-10-CM | POA: Diagnosis not present

## 2022-01-27 LAB — HM DIABETES EYE EXAM

## 2022-01-28 ENCOUNTER — Encounter: Payer: Self-pay | Admitting: Internal Medicine

## 2022-02-15 DIAGNOSIS — E1165 Type 2 diabetes mellitus with hyperglycemia: Secondary | ICD-10-CM | POA: Diagnosis not present

## 2022-02-17 ENCOUNTER — Other Ambulatory Visit: Payer: Self-pay | Admitting: Internal Medicine

## 2022-02-17 DIAGNOSIS — G4700 Insomnia, unspecified: Secondary | ICD-10-CM

## 2022-02-17 DIAGNOSIS — I700 Atherosclerosis of aorta: Secondary | ICD-10-CM

## 2022-02-17 NOTE — Telephone Encounter (Signed)
Requested medication (s) are due for refill today - no  Requested medication (s) are on the active medication list -yes  Future visit scheduled -yes  Last refill: 09/13/21 #30 5RF  Notes to clinic: non delegated Rx  Requested Prescriptions  Pending Prescriptions Disp Refills   zolpidem (AMBIEN) 10 MG tablet [Pharmacy Med Name: ZOLPIDEM TARTRATE 10 MG TAB] 30 tablet     Sig: TAKE 1 TABLET BY MOUTH AT BEDTIME.     Not Delegated - Psychiatry:  Anxiolytics/Hypnotics Failed - 02/17/2022 11:29 AM      Failed - This refill cannot be delegated      Failed - Urine Drug Screen completed in last 360 days      Failed - Valid encounter within last 6 months    Recent Outpatient Visits           6 months ago Centrilobular emphysema ([place])   [place] [facility] at [facility], Jesse Sans, MD   8 months ago Community acquired pneumonia, unspecified laterality   [place] [facility] at [facility], Jesse Sans, MD   8 months ago Annual physical exam   [facility] and [facility] at [facility], Jesse Sans, MD   9 months ago Community acquired pneumonia, unspecified laterality   [facility] and [facility] Medicine at [facility], Jesse Sans, MD   10 months ago COVID-19 virus infection   [facility] and [facility] at [facility], Jesse Sans, MD       Future Appointments             In 3 months Glean Hess, MD [facility] Primary Care and Sports Medicine at [facility]            Signed Prescriptions Disp Refills   pravastatin (PRAVACHOL) 40 MG tablet 90 tablet 0    Sig: TAKE 1 TABLET BY MOUTH DAILY     Cardiovascular:  Antilipid - Statins Failed - 02/17/2022 11:29 AM      Failed - Lipid Panel in normal range within the last 12 months    Cholesterol, Total  Date Value Ref Range Status  05/26/2021 159 100 - 199 mg/dL Final    LDL Chol Calc (NIH)  Date Value Ref Range Status  05/26/2021 88 0 - 99 mg/dL Final   HDL  Date Value Ref Range Status  05/26/2021 58 >39 mg/dL Final   Triglycerides  Date Value Ref Range Status  05/26/2021 69 0 - 149 mg/dL Final         Passed - Patient is not pregnant      Passed - Valid encounter within last 12 months    Recent Outpatient Visits           6 months ago Centrilobular emphysema ([place])   [place] [facility] at [facility], Jesse Sans, MD   8 months ago Community acquired pneumonia, unspecified laterality   [place] [facility] at [facility], Jesse Sans, MD   8 months ago Annual physical exam   [facility] Primary Care and [facility] Medicine at [facility], Jesse Sans, MD   9 months ago Community acquired pneumonia, unspecified laterality   [facility] and [facility] at [facility], Jesse Sans, MD   10 months ago COVID-19 virus infection   [facility] Primary Care  and Sports Medicine at [employer], Jesse Sans, MD       Future Appointments             In 3 months Army Melia Jesse Sans, MD [facility] Primary Care and Sports Medicine at [facility], [facility]               Requested Prescriptions  Pending Prescriptions Disp Refills   zolpidem (AMBIEN) 10 MG tablet [Pharmacy Med Name: ZOLPIDEM TARTRATE 10 MG TAB] 30 tablet     Sig: TAKE 1 TABLET BY MOUTH AT BEDTIME.     Not Delegated - Psychiatry:  Anxiolytics/Hypnotics Failed - 02/17/2022 11:29 AM      Failed - This refill cannot be delegated      Failed - Urine Drug Screen completed in last 360 days      Failed - Valid encounter within last 6 months    Recent Outpatient Visits           6 months ago Centrilobular emphysema ([place])   [place] [facility] at [facility], Jesse Sans, MD   8 months ago Community acquired pneumonia, unspecified  laterality   [place] [facility] and [facility] Medicine at [facility], Jesse Sans, MD   8 months ago Annual physical exam   [facility] and [facility] at [facility], Jesse Sans, MD   9 months ago Community acquired pneumonia, unspecified laterality   [facility] Primary Care and Sports Medicine at [facility], Jesse Sans, MD   10 months ago COVID-19 virus infection   [facility] Medicine at [facility], Jesse Sans, MD       Future Appointments             In 3 months Glean Hess, MD [facility] Primary Care and Sports Medicine at [facility]            Signed Prescriptions Disp Refills   pravastatin (PRAVACHOL) 40 MG tablet 90 tablet 0    Sig: TAKE 1 TABLET BY MOUTH DAILY     Cardiovascular:  Antilipid - Statins Failed - 02/17/2022 11:29 AM      Failed - Lipid Panel in normal range within the last 12 months    Cholesterol, Total  Date Value Ref Range Status  05/26/2021 159 100 - 199 mg/dL Final   LDL Chol Calc (NIH)  Date Value Ref Range Status  05/26/2021 88 0 - 99 mg/dL Final   HDL  Date Value Ref Range Status  05/26/2021 58 >39 mg/dL Final   Triglycerides  Date Value Ref Range Status  05/26/2021 69 0 - 149 mg/dL Final         Passed - Patient is not pregnant      Passed - Valid encounter within last 12 months    Recent Outpatient Visits           6 months ago Centrilobular emphysema ([place])   [place] [facility] at [facility], Jesse Sans, MD   8 months ago Community acquired pneumonia, unspecified laterality    Primary Care and Sports Medicine at [facility], Jesse Sans, MD   8 months ago Annual physical exam   [facility] and [facility] at [facility], Jesse Sans, MD   9 months ago Community acquired pneumonia, unspecified laterality   [facility] Primary Care and  Sports Medicine at [facility], Jesse Sans, MD   10 months ago COVID-19 virus infection   [facility] Primary Care and Sports Medicine at [facility], Jesse Sans, MD       Future Appointments             In 3 months Army Melia, Jesse Sans, MD [facility] Primary Care and Sports Medicine at [facility], [facility]

## 2022-02-17 NOTE — Telephone Encounter (Signed)
Please advise 

## 2022-02-17 NOTE — Telephone Encounter (Signed)
Requested Prescriptions  Pending Prescriptions Disp Refills  . zolpidem (AMBIEN) 10 MG tablet [Pharmacy Med Name: ZOLPIDEM TARTRATE 10 MG TAB] 30 tablet     Sig: TAKE 1 TABLET BY MOUTH AT BEDTIME.     Not Delegated - Psychiatry:  Anxiolytics/Hypnotics Failed - 02/17/2022 11:29 AM      Failed - This refill cannot be delegated      Failed - Urine Drug Screen completed in last 360 days      Failed - Valid encounter within last 6 months    Recent Outpatient Visits          6 months ago Centrilobular emphysema ([place])   [place] [facility] at [facility], Jesse Sans, MD   8 months ago Community acquired pneumonia, unspecified laterality   [place] [facility] and [facility] Medicine at [facility], Jesse Sans, MD   8 months ago Annual physical exam   [facility] Primary Care and Sports Medicine at [facility], Jesse Sans, MD   9 months ago Community acquired pneumonia, unspecified laterality   [facility] Primary Care and [facility] Medicine at [facility], Jesse Sans, MD   10 months ago COVID-19 virus infection   [facility] and [facility] Medicine at [facility], Jesse Sans, MD      Future Appointments            In 3 months Army Melia, Jesse Sans, MD [facility] Health Primary Care and Sports Medicine at [facility], [facility]           . pravastatin (PRAVACHOL) 40 MG tablet [Pharmacy Med Name: PRAVASTATIN SODIUM 40 MG TAB] 90 tablet 0    Sig: TAKE 1 TABLET BY MOUTH DAILY     Cardiovascular:  Antilipid - Statins Failed - 02/17/2022 11:29 AM      Failed - Lipid Panel in normal range within the last 12 months    Cholesterol, Total  Date Value Ref Range Status  05/26/2021 159 100 - 199 mg/dL Final   LDL Chol Calc (NIH)  Date Value Ref Range Status  05/26/2021 88 0 - 99 mg/dL Final   HDL  Date Value Ref Range Status  05/26/2021 58 >39 mg/dL Final   Triglycerides  Date Value Ref Range Status  05/26/2021  69 0 - 149 mg/dL Final         Passed - Patient is not pregnant      Passed - Valid encounter within last 12 months    Recent Outpatient Visits          6 months ago Centrilobular emphysema ([place])   [place] [facility] at [facility], Jesse Sans, MD   8 months ago Community acquired pneumonia, unspecified laterality   [place] [facility] and Sports Medicine at [facility], Jesse Sans, MD   8 months ago Annual physical exam   [facility] and [facility] at [facility], Jesse Sans, MD   9 months ago Community acquired pneumonia, unspecified laterality   [facility] Health Primary Care and Sports [facility] at [facility], Jesse Sans, MD   10 months ago COVID-19 virus infection   [facility] Primary Care and Sports Medicine at [facility], Jesse Sans, MD      Future Appointments            In 3 months Army Melia, Jesse Sans, MD [place] and Sports Medicine  at [employer], [place]

## 2022-03-09 ENCOUNTER — Ambulatory Visit: Payer: Medicare Other | Admitting: Internal Medicine

## 2022-03-09 ENCOUNTER — Encounter: Payer: Self-pay | Admitting: Internal Medicine

## 2022-03-09 VITALS — BP 110/64 | HR 83 | Temp 97.9°F | Ht 67.0 in | Wt 182.0 lb

## 2022-03-09 DIAGNOSIS — J432 Centrilobular emphysema: Secondary | ICD-10-CM

## 2022-03-09 DIAGNOSIS — J40 Bronchitis, not specified as acute or chronic: Secondary | ICD-10-CM

## 2022-03-09 MED ORDER — CEFDINIR 300 MG PO CAPS: 300.0000 mg | ORAL_CAPSULE | Freq: Two times a day (BID) | ORAL | 0 refills | Status: AC

## 2022-03-09 MED ORDER — ALBUTEROL SULFATE HFA 108 (90 BASE) MCG/ACT IN AERS: 2.0000 | INHALATION_SPRAY | Freq: Four times a day (QID) | RESPIRATORY_TRACT | 0 refills | Status: DC | PRN

## 2022-03-09 NOTE — Patient Instructions (Signed)
For runny nose - try Allegra 180 mg or Claritin 10 mg  Continue mucinex

## 2022-03-09 NOTE — Progress Notes (Signed)
Date:  03/09/2022   Name:  Paul Stokes   DOB:  05-Mar-1956   MRN:  [id number]   Chief Complaint: Nasal Congestion (Cough- with a little (green) mucous)  URI  This is a new problem. Episode onset: X1 week. The problem has been gradually improving. There has been no fever. Associated symptoms include congestion, coughing, headaches, rhinorrhea, sinus pain, sneezing, a sore throat and wheezing. Pertinent negatives include no chest pain. Treatments tried: mucinex. The treatment provided mild relief.    Lab Results  Component Value Date   NA 139 05/26/2021   K 4.8 05/26/2021   CO2 25 05/26/2021   GLUCOSE 152 (H) 05/26/2021   BUN 14 05/26/2021   CREATININE 1.13 05/26/2021   CALCIUM 9.6 05/26/2021   EGFR 72 05/26/2021   GFRNONAA >60 03/30/2021   Lab Results  Component Value Date   CHOL 159 05/26/2021   HDL 58 05/26/2021   LDLCALC 88 05/26/2021   TRIG 69 05/26/2021   CHOLHDL 2.7 05/26/2021   Lab Results  Component Value Date   TSH 1.510 08/06/2020   Lab Results  Component Value Date   HGBA1C 8.1 (H) 05/26/2021   Lab Results  Component Value Date   WBC 10.1 06/22/2021   HGB 14.4 06/22/2021   HCT 43.2 06/22/2021   MCV 88.2 06/22/2021   PLT 374 06/22/2021   Lab Results  Component Value Date   ALT 19 05/26/2021   AST 13 05/26/2021   ALKPHOS 124 (H) 05/26/2021   BILITOT 0.3 05/26/2021   No results found for: "25OHVITD2", "25OHVITD3", "VD25OH"   Review of Systems  Constitutional:  Positive for fatigue. Negative for chills, diaphoresis and fever.  HENT:  Positive for congestion, rhinorrhea, sinus pain, sneezing and sore throat.   Respiratory:  Positive for cough and wheezing. Negative for chest tightness and shortness of breath.   Cardiovascular:  Negative for chest pain and leg swelling.  Neurological:  Positive for headaches.  Psychiatric/Behavioral:  Positive for sleep disturbance. Negative for dysphoric mood. The patient is not nervous/anxious.     Patient  Active Problem List   Diagnosis Date Noted   Benign essential tremor 07/26/2021   History of gross hematuria 01/05/2021   Centrilobular emphysema ([place]) 12/11/2019   Personal history of colonic polyps    Aortic atherosclerosis ([place]) 08/27/2019   Dyspepsia 08/06/2018   Acute bilateral low back pain with right-sided sciatica 11/29/2017   Mood disorder ([place]) 11/15/2016   BPH with obstruction/lower urinary tract symptoms 06/15/2015   Hyperlipidemia due to type 1 diabetes mellitus ([place]) 06/15/2015   Insomnia, persistent 03/06/2015   Compulsive tobacco user syndrome 03/06/2015   Type 1 diabetes mellitus with sensory neuropathy ([place]) 03/06/2015   Presence of insulin pump 06/08/2014    Allergies  Allergen Reactions   Lisinopril Cough    Past Surgical History:  Procedure Laterality Date   CATARACT EXTRACTION, BILATERAL     COLONOSCOPY  08/14/2013   7 tubular adenomas   COLONOSCOPY WITH PROPOFOL N/A 09/10/2019   Procedure: COLONOSCOPY WITH PROPOFOL;  Surgeon: [name], MD;  Location: [facility] ENDOSCOPY;  Service: Endoscopy;  Laterality: N/A;   COLONOSCOPY WITH PROPOFOL N/A 09/11/2019   Procedure: COLONOSCOPY WITH PROPOFOL;  Surgeon: Jonathon Bellows, MD;  Location: [facility] ENDOSCOPY;  Service: Gastroenterology;  Laterality: N/A;   HOLEP-LASER ENUCLEATION OF THE PROSTATE WITH MORCELLATION N/A 04/02/2021   Procedure: HOLEP-LASER ENUCLEATION OF THE PROSTATE WITH MORCELLATION;  Surgeon: Billey Co, MD;  Location: [facility] ORS;  Service: Urology;  Laterality:  N/A;    Social History   Tobacco Use   Smoking status: Some Days    Packs/day: 0.25    Years: 47.00    Total pack years: 11.75    Types: Cigarettes    Start date: 05/12/1968    Last attempt to quit: 06/08/2021    Years since quitting: 0.7    Passive exposure: Past   Smokeless tobacco: Never  Vaping Use   Vaping Use: Never used  Substance Use Topics   Alcohol use: No    Alcohol/week: 0.0 standard drinks of alcohol   Drug use: No      Medication list has been reviewed and updated.  Current Meds  Medication Sig   albuterol (VENTOLIN HFA) 108 (90 Base) MCG/ACT inhaler Inhale 2 puffs into the lungs every 6 (six) hours as needed for wheezing or shortness of breath.   aspirin EC 81 MG tablet Take 81 mg by mouth daily. Swallow whole.   BAYER CONTOUR NEXT TEST test strip    buPROPion (WELLBUTRIN SR) 150 MG 12 hr tablet TAKE 1 TABLET BY MOUTH EVERY MORNING AND2 TABLETS AT BEDTIME   Continuous Blood Gluc Sensor (DEXCOM G6 SENSOR) MISC by Does not apply route.   Cyanocobalamin 5000 MCG/ML LIQD Place under the tongue daily. Liquid   fluticasone-salmeterol (ADVAIR HFA) 45-21 MCG/ACT inhaler Inhale 2 puffs into the lungs 2 (two) times daily.   gabapentin (NEURONTIN) 300 MG capsule TAKE 2 CAPULES BY MOUTH THREE TIMES DAILY   guaiFENesin (MUCINEX PO) Take by mouth as needed.   HUMALOG 100 UNIT/ML injection Inject 80 Units into the skin See admin instructions. 80 units per day through insulin pump   losartan (COZAAR) 25 MG tablet Take 1 tablet (25 mg total) by mouth daily.   pravastatin (PRAVACHOL) 40 MG tablet TAKE 1 TABLET BY MOUTH DAILY   VITAMIN D PO Take 5,000 Units by mouth daily.   zolpidem (AMBIEN) 10 MG tablet TAKE 1 TABLET BY MOUTH AT BEDTIME.       03/09/2022    9:07 AM 07/26/2021    1:24 PM 06/22/2021    8:25 AM 05/26/2021    9:23 AM  GAD 7 : Generalized Anxiety Score  Nervous, Anxious, on Edge 0 1 0 0  Control/stop worrying 0 1 0 0  Worry too much - different things 0 1 0 0  Trouble relaxing 0 1 0 0  Restless 0 0 0 0  Easily annoyed or irritable 1 0 0 0  Afraid - awful might happen 0 0 0 0  Total GAD 7 Score 1 4 0 0  Anxiety Difficulty Not difficult at all Not difficult at all Not difficult at all Not difficult at all       03/09/2022    9:06 AM 01/07/2022    8:54 AM 01/07/2022    8:34 AM  Depression screen PHQ 2/9  Decreased Interest 1 0 0  Down, Depressed, Hopeless 0 0 0  PHQ - 2 Score 1 0 0   Altered sleeping 2 2   Tired, decreased energy 3 0   Change in appetite 0 0   Feeling bad or failure about yourself  0 0   Trouble concentrating 0 0   Moving slowly or fidgety/restless 0 0   Suicidal thoughts 0 0   PHQ-9 Score 6 2   Difficult doing work/chores Not difficult at all Not difficult at all     BP Readings from Last 3 Encounters:  03/09/22 110/64  01/07/22 118/72  12/09/21 132/82    Physical Exam Vitals and nursing note reviewed.  Constitutional:      General: He is not in acute distress.    Appearance: Normal appearance. He is well-developed.  HENT:     Head: Normocephalic and atraumatic.     Right Ear: Tympanic membrane normal.     Left Ear: Tympanic membrane normal.     Nose:     Right Sinus: No maxillary sinus tenderness or frontal sinus tenderness.     Left Sinus: No maxillary sinus tenderness or frontal sinus tenderness.     Mouth/Throat:     Pharynx: Oropharynx is clear.  Cardiovascular:     Rate and Rhythm: Normal rate and regular rhythm.     Pulses: Normal pulses.  Pulmonary:     Effort: Pulmonary effort is normal. No respiratory distress.     Breath sounds: Normal air entry. Transmitted upper airway sounds present. No stridor. Examination of the right-middle field reveals wheezing. Wheezing present. No rhonchi.  Musculoskeletal:     Cervical back: Normal range of motion.  Lymphadenopathy:     Cervical: No cervical adenopathy.  Skin:    General: Skin is warm and dry.     Findings: No rash.  Neurological:     Mental Status: He is alert and oriented to person, place, and time.  Psychiatric:        Mood and Affect: Mood normal.        Behavior: Behavior normal.     Wt Readings from Last 3 Encounters:  03/09/22 182 lb (82.6 kg)  01/07/22 185 lb (83.9 kg)  12/09/21 190 lb (86.2 kg)    BP 110/64   Pulse 83   Temp 97.9 F (36.6 C) (Oral)   Ht $R'5\' 7"'Ml$  (1.702 m)   Wt 182 lb (82.6 kg)   SpO2 96%   BMI 28.51 kg/m   Assessment and Plan: 1.  Bronchitis Continue Mucinex Add Allegra or Claritin for rhinorrhea - cefdinir (OMNICEF) 300 MG capsule; Take 1 capsule (300 mg total) by mouth 2 (two) times daily for 10 days.  Dispense: 20 capsule; Refill: 0  2. Centrilobular emphysema (HCC) Use albuterol MDI PRN wheezing - albuterol (VENTOLIN HFA) 108 (90 Base) MCG/ACT inhaler; Inhale 2 puffs into the lungs every 6 (six) hours as needed for wheezing or shortness of breath.  Dispense: 18 g; Refill: 0   Partially dictated using Editor, commissioning. Any errors are unintentional.  Halina Maidens, MD [facility]  03/09/2022

## 2022-03-14 DIAGNOSIS — E10649 Type 1 diabetes mellitus with hypoglycemia without coma: Secondary | ICD-10-CM | POA: Diagnosis not present

## 2022-03-14 DIAGNOSIS — E1065 Type 1 diabetes mellitus with hyperglycemia: Secondary | ICD-10-CM | POA: Diagnosis not present

## 2022-03-14 DIAGNOSIS — Z4681 Encounter for fitting and adjustment of insulin pump: Secondary | ICD-10-CM | POA: Diagnosis not present

## 2022-03-14 DIAGNOSIS — E1059 Type 1 diabetes mellitus with other circulatory complications: Secondary | ICD-10-CM | POA: Diagnosis not present

## 2022-03-14 DIAGNOSIS — E1040 Type 1 diabetes mellitus with diabetic neuropathy, unspecified: Secondary | ICD-10-CM | POA: Diagnosis not present

## 2022-03-14 DIAGNOSIS — E782 Mixed hyperlipidemia: Secondary | ICD-10-CM | POA: Diagnosis not present

## 2022-03-14 DIAGNOSIS — F17210 Nicotine dependence, cigarettes, uncomplicated: Secondary | ICD-10-CM | POA: Diagnosis not present

## 2022-03-18 DIAGNOSIS — E1165 Type 2 diabetes mellitus with hyperglycemia: Secondary | ICD-10-CM | POA: Diagnosis not present

## 2022-04-04 ENCOUNTER — Other Ambulatory Visit: Payer: Self-pay | Admitting: Internal Medicine

## 2022-04-04 DIAGNOSIS — R809 Proteinuria, unspecified: Secondary | ICD-10-CM

## 2022-04-04 DIAGNOSIS — E1040 Type 1 diabetes mellitus with diabetic neuropathy, unspecified: Secondary | ICD-10-CM

## 2022-04-05 NOTE — Telephone Encounter (Signed)
Requested Prescriptions  Pending Prescriptions Disp Refills   gabapentin (NEURONTIN) 300 MG capsule [Pharmacy Med Name: GABAPENTIN 300 MG CAP] 540 capsule 0    Sig: TAKE 2 [place] DAILY     Neurology: Anticonvulsants - gabapentin Passed - 04/04/2022  4:09 PM      Passed - Cr in normal range and within 360 days    Creatinine, Ser  Date Value Ref Range Status  05/26/2021 1.13 0.76 - 1.27 mg/dL Final         Passed - Completed PHQ-2 or PHQ-9 in the last 360 days      Passed - Valid encounter within last 12 months    Recent Outpatient Visits           3 weeks ago [place] and [facility] Medicine at [facility], Jesse Sans, MD   8 months ago Centrilobular emphysema [facility])   [place] [facility] at [facility], Jesse Sans, MD   9 months ago Community acquired pneumonia, unspecified laterality   [place] [facility] at [facility], Jesse Sans, MD   10 months ago Annual physical exam   [facility] at [facility], Jesse Sans, MD   10 months ago Community acquired pneumonia, unspecified laterality   [place] [facility] at [facility], Jesse Sans, MD       Future Appointments             In 1 month [facility] Melia, Jesse Sans, MD [facility] and [facility] Medicine at [place] [place]             losartan (COZAAR) 25 MG tablet [Pharmacy Med Name: LOSARTAN POTASSIUM 25 MG TAB] 90 tablet 0    Sig: TAKE 1 TABLET BY MOUTH ONCE DAILY     Cardiovascular:  Angiotensin Receptor Blockers Failed - 04/04/2022  4:09 PM      Failed - Cr in normal range and within 180 days    Creatinine, Ser  Date Value Ref Range Status  05/26/2021 1.13 0.76 - 1.27 mg/dL Final         Failed - K in normal range and within 180 days    Potassium  Date Value Ref Range Status  05/26/2021 4.8 3.5 -  5.2 mmol/L Final         Passed - Patient is not pregnant      Passed - Last BP in normal range    BP Readings from Last 1 Encounters:  03/09/22 110/64         Passed - Valid encounter within last 6 months    Recent Outpatient Visits           3 weeks ago [place] at [facility], Jesse Sans, MD   8 months ago Centrilobular emphysema [facility])   [place] [facility] at [facility], Jesse Sans, MD   9 months ago Community acquired pneumonia, unspecified laterality   [facility] at [facility], Jesse Sans, MD   10 months ago Annual physical exam   [facility], Jesse Sans, MD   10 months ago Community acquired pneumonia, unspecified laterality   [facility] and [facility] Medicine at [facility]  Robert Bellow, MD       Future Appointments             In 1 month Army Melia Jesse Sans, MD [facility] Primary [facility] and Sports Medicine at [facility]

## 2022-04-12 ENCOUNTER — Ambulatory Visit (INDEPENDENT_AMBULATORY_CARE_PROVIDER_SITE_OTHER): Payer: Medicare Other

## 2022-04-12 ENCOUNTER — Ambulatory Visit
Admission: EM | Admit: 2022-04-12 | Discharge: 2022-04-12 | Disposition: A | Discharge status: Home / Self Care | Payer: Medicare Other | Attending: Emergency Medicine | Admitting: Emergency Medicine

## 2022-04-12 ENCOUNTER — Encounter: Payer: Self-pay | Admitting: Emergency Medicine

## 2022-04-12 ENCOUNTER — Other Ambulatory Visit: Payer: Self-pay

## 2022-04-12 DIAGNOSIS — J441 Chronic obstructive pulmonary disease with (acute) exacerbation: Secondary | ICD-10-CM

## 2022-04-12 DIAGNOSIS — J432 Centrilobular emphysema: Secondary | ICD-10-CM

## 2022-04-12 DIAGNOSIS — J189 Pneumonia, unspecified organism: Secondary | ICD-10-CM

## 2022-04-12 DIAGNOSIS — R062 Wheezing: Secondary | ICD-10-CM

## 2022-04-12 MED ORDER — LEVOFLOXACIN 750 MG PO TABS: 750.0000 mg | ORAL_TABLET | Freq: Every day | ORAL | 0 refills | Status: AC

## 2022-04-12 MED ORDER — IPRATROPIUM-ALBUTEROL 0.5-2.5 (3) MG/3ML IN SOLN
3.0000 mL | Freq: Once | RESPIRATORY_TRACT | Status: AC
  Administered 2022-04-12: 3 mL via RESPIRATORY_TRACT

## 2022-04-12 MED ORDER — AEROCHAMBER MV MISC: 1 refills | Status: AC

## 2022-04-12 MED ORDER — ALBUTEROL SULFATE (2.5 MG/3ML) 0.083% IN NEBU: 2.5000 mg | INHALATION_SOLUTION | RESPIRATORY_TRACT | 0 refills | Status: DC | PRN

## 2022-04-12 MED ORDER — ALBUTEROL SULFATE HFA 108 (90 BASE) MCG/ACT IN AERS: 2.0000 | INHALATION_SPRAY | RESPIRATORY_TRACT | 0 refills | Status: DC | PRN

## 2022-04-12 MED ORDER — ALBUTEROL SULFATE (2.5 MG/3ML) 0.083% IN NEBU: 2.5000 mg | INHALATION_SOLUTION | Freq: Four times a day (QID) | RESPIRATORY_TRACT | 1 refills | Status: DC | PRN

## 2022-04-12 MED ORDER — PREDNISONE 20 MG PO TABS: 40.0000 mg | ORAL_TABLET | Freq: Every day | ORAL | 0 refills | Status: AC

## 2022-04-12 NOTE — ED Provider Notes (Signed)
HPI  SUBJECTIVE:  Paul Stokes is a 66 y.o. male who presents with URI symptoms for 2 weeks with nasal congestion, sinus pain and pressure, postnasal drip, rhinorrhea, cough productive of green sputum, wheezing, shortness of breath, dyspnea on exertion, diffuse chest tightness that resolves with albuterol.  He reports increased amount of sputum.  No fevers. Patient saw his PCP on 10/25 for these symptoms.  Thought to have bronchitis, was advised to continue Mucinex, add Allegra or Claritin, and was sent home with [facility] for 10 days.  Was also thought to have an emphysema exacerbation and was sent home with albuterol every 6 hours.  He has finished the [place], is using his albuterol 4-5 times a day, normally uses it as needed.  He is also compliant with his Advair and taking Tylenol and ibuprofen.  No aggravating factors.  No antipyretic in the past 6 hours.  He is able to sleep at night without waking up coughing.  He states that the chest tightness is identical to previous COPD exacerbations.  He states that prednisone is the only thing that helps when he has these exacerbation.  He is worried that he may have a pneumonia.    He has a past medical history of type 1 diabetes on insulin pump, COPD, hyperlipidemia, pneumonia, hypertension.  No history of coronary disease, MI, DKA.  PCP: [facility].  Past Medical History:  Diagnosis Date   Depression    Diabetes mellitus without complication ([place])    type 1; insulin pump   Emphysema of lung (HCC)    GERD (gastroesophageal reflux disease)    Hyperlipidemia    Prostate disease     Past Surgical History:  Procedure Laterality Date   CATARACT EXTRACTION, BILATERAL     COLONOSCOPY  08/14/2013   7 tubular adenomas   COLONOSCOPY WITH PROPOFOL N/A 09/10/2019   Procedure: COLONOSCOPY WITH PROPOFOL;  Surgeon: [name], MD;  Location: [facility] ENDOSCOPY;  Service: Endoscopy;  Laterality: N/A;   COLONOSCOPY WITH PROPOFOL N/A 09/11/2019    Procedure: COLONOSCOPY WITH PROPOFOL;  Surgeon: Jonathon Bellows, MD;  Location: [facility] ENDOSCOPY;  Service: Gastroenterology;  Laterality: N/A;   HOLEP-LASER ENUCLEATION OF THE PROSTATE WITH MORCELLATION N/A 04/02/2021   Procedure: HOLEP-LASER ENUCLEATION OF THE PROSTATE WITH MORCELLATION;  Surgeon: Billey Co, MD;  Location: [facility] ORS;  Service: Urology;  Laterality: N/A;    Family History  Problem Relation Age of Onset   Diabetes Mother    Diabetes Father     Social History   Tobacco Use   Smoking status: Some Days    Packs/day: 0.25    Years: 47.00    Total pack years: 11.75    Types: Cigarettes    Start date: 05/12/1968    Last attempt to quit: 06/08/2021    Years since quitting: 0.8    Passive exposure: Past   Smokeless tobacco: Never  Vaping Use   Vaping Use: Never used  Substance Use Topics   Alcohol use: No    Alcohol/week: 0.0 standard drinks of alcohol   Drug use: No    No current facility-administered medications for this encounter.  Current Outpatient Medications:    albuterol (PROVENTIL) (2.5 MG/3ML) 0.083% nebulizer solution, Take 3 mLs (2.5 mg total) by nebulization every 6 (six) hours as needed for wheezing or shortness of breath., Disp: 20 mL, Rfl: 1   levofloxacin (LEVAQUIN) 750 MG tablet, Take 1 tablet (750 mg total) by mouth daily for 5 days., Disp: 5 tablet, Rfl:  0   predniSONE (DELTASONE) 20 MG tablet, Take 2 tablets (40 mg total) by mouth daily with breakfast for 5 days., Disp: 10 tablet, Rfl: 0   Spacer/Aero-Holding Chambers (AEROCHAMBER MV) inhaler, Use as instructed, Disp: 1 each, Rfl: 1   albuterol (VENTOLIN HFA) 108 (90 Base) MCG/ACT inhaler, Inhale 2 puffs into the lungs every 4 (four) hours as needed for wheezing or shortness of breath., Disp: 18 g, Rfl: 0   aspirin EC 81 MG tablet, Take 81 mg by mouth daily. Swallow whole., Disp: , Rfl:    BAYER CONTOUR NEXT TEST test strip, , Disp: , Rfl: 1   buPROPion (WELLBUTRIN SR) 150 MG 12 hr tablet, TAKE 1  TABLET BY MOUTH EVERY MORNING AND2 TABLETS AT BEDTIME, Disp: 360 tablet, Rfl: 0   Continuous Blood Gluc Sensor (DEXCOM G6 SENSOR) MISC, by Does not apply route., Disp: , Rfl:    Cyanocobalamin 5000 MCG/ML LIQD, Place under the tongue daily. Liquid, Disp: , Rfl:    fluticasone-salmeterol (ADVAIR HFA) 45-21 MCG/ACT inhaler, Inhale 2 puffs into the lungs 2 (two) times daily., Disp: 1 each, Rfl: 5   gabapentin (NEURONTIN) 300 MG capsule, TAKE 2 CAPULES BY MOUTH THREE TIMES DAILY, Disp: 540 capsule, Rfl: 0   guaiFENesin (MUCINEX PO), Take by mouth as needed., Disp: , Rfl:    HUMALOG 100 UNIT/ML injection, Inject 80 Units into the skin See admin instructions. 80 units per day through insulin pump, Disp: , Rfl:    losartan (COZAAR) 25 MG tablet, TAKE 1 TABLET BY MOUTH ONCE DAILY, Disp: 90 tablet, Rfl: 0   pravastatin (PRAVACHOL) 40 MG tablet, TAKE 1 TABLET BY MOUTH DAILY, Disp: 90 tablet, Rfl: 0   tadalafil (CIALIS) 5 MG tablet, Take 1 tablet (5 mg total) by mouth daily as needed for erectile dysfunction. (Patient not taking: Reported on 01/07/2022), Disp: 90 tablet, Rfl: 3   VITAMIN D PO, Take 5,000 Units by mouth daily., Disp: , Rfl:    zolpidem (AMBIEN) 10 MG tablet, TAKE 1 TABLET BY MOUTH AT BEDTIME., Disp: 30 tablet, Rfl: 5  Allergies  Allergen Reactions   Lisinopril Cough     ROS  As noted in HPI.   Physical Exam  BP (!) 140/71 (BP Location: Left Arm)   Pulse 75   Temp 98 F (36.7 C) (Oral)   Resp 20   SpO2 96%   Constitutional: Well developed, well nourished, no acute distress Eyes:  EOMI, conjunctiva normal bilaterally HENT: Normocephalic, atraumatic,mucus membranes moist congestion.  No maxillary, frontal sinus tenderness Respiratory: Normal inspiratory effort, wheezing, rales right lower lobe.  Fair air movement.  No anterior, lateral chest wall tenderness Cardiovascular: Normal rate, regular rhythm, no murmurs, rubs, gallops GI: nondistended skin: No rash, skin  intact Musculoskeletal: no deformities Neurologic: Alert & oriented x 3, no focal neuro deficits Psychiatric: Speech and behavior appropriate   ED Course   Medications  ipratropium-albuterol (DUONEB) 0.5-2.5 (3) MG/3ML nebulizer solution 3 mL (3 mLs Nebulization Given 04/12/22 1224)    Orders Placed This Encounter  Procedures   DG Chest 2 View    Standing Status:   Standing    Number of Occurrences:   1    Order Specific Question:   Reason for Exam (SYMPTOM  OR DIAGNOSIS REQUIRED)    Answer:   wheez RLL h/o COPD r/o PNA    No results found for this or any previous visit (from the past 24 hour(s)). DG Chest 2 View  Result Date: 04/12/2022 CLINICAL DATA:  wheez RLL h/o COPD r/o PNA EXAM: CHEST - 2 VIEW COMPARISON:  Radiograph 06/22/2021 FINDINGS: Unchanged cardiomediastinal silhouette. There is faint right middle lobe airspace disease. No pleural effusion or evidence of pneumothorax. There is no acute osseous abnormality. Unchanged sclerosis in the left proximal humerus, likely benign. IMPRESSION: Right middle lobe pneumonia. Recommend follow-up PA and lateral chest radiograph in 6-12 weeks to ensure resolution. Electronically Signed   By: Maurine Simmering M.D.   On: 04/12/2022 12:47    ED Clinical Impression  1. Community acquired pneumonia of right middle lobe of lung   2. COPD exacerbation ([place])   3. Centrilobular emphysema [facility])      ED Assessment/Plan     Outside records reviewed.  As noted in HPI.  Chest x-ray to rule out a pneumonia.  DuoNeb.  Will reevaluate.  Reviewed imaging independently.  Right middle lobe pneumonia.  See radiology report for full details.  Patient with a right middle lobe pneumonia and COPD exacerbation.  Will send home on Levaquin 750 mg p.o. daily for 5 days because of the recent antibiotic use and Levaquin will cover both for COPD exacerbation and community-acquired pneumonia.  He has tolerated this before.  Home with regularly scheduled  albuterol inhaler for 4 days, then as needed, 40 mg of prednisone.  He has also tolerated this before without any problem.  He will adjust his insulin pump accordingly.  Reevaluation, patient states that he feels much better.  He has loud wheezing in this right lower lobe.  Discussed  imaging, MDM, treatment plan, and plan for follow-up with patient. Discussed sn/sx that should prompt return to the ED. patient agrees with plan.   Meds ordered this encounter  Medications   ipratropium-albuterol (DUONEB) 0.5-2.5 (3) MG/3ML nebulizer solution 3 mL   albuterol (VENTOLIN HFA) 108 (90 Base) MCG/ACT inhaler    Sig: Inhale 2 puffs into the lungs every 4 (four) hours as needed for wheezing or shortness of breath.    Dispense:  18 g    Refill:  0    DISPENSE GENERIC ALBUTEROL INHALER NOT VENTOLIN   levofloxacin (LEVAQUIN) 750 MG tablet    Sig: Take 1 tablet (750 mg total) by mouth daily for 5 days.    Dispense:  5 tablet    Refill:  0   albuterol (PROVENTIL) (2.5 MG/3ML) 0.083% nebulizer solution    Sig: Take 3 mLs (2.5 mg total) by nebulization every 6 (six) hours as needed for wheezing or shortness of breath.    Dispense:  20 mL    Refill:  1   Spacer/Aero-Holding Chambers (AEROCHAMBER MV) inhaler    Sig: Use as instructed    Dispense:  1 each    Refill:  1   predniSONE (DELTASONE) 20 MG tablet    Sig: Take 2 tablets (40 mg total) by mouth daily with breakfast for 5 days.    Dispense:  10 tablet    Refill:  0      *This clinic note was created using Lobbyist. Therefore, there may be occasional mistakes despite careful proofreading.  ?    Melynda Ripple, MD 04/13/22 [phone]

## 2022-04-12 NOTE — Discharge Instructions (Signed)
2 puffs from your albuterol inhaler using your spacer or nebulizer treatment every 4 hours for 2 days, then every 6 hours for 2 days, then as needed.  Prednisone will help with inflammation in the lungs.  You have a right middle lobe pneumonia on chest x-ray.  Finish Levaquin, even if you feel better.  You will need a repeat x-ray in 4 to 6 weeks to make sure that this pneumonia has resolved.

## 2022-04-12 NOTE — ED Triage Notes (Signed)
For 2 weeks, patient has noticed breathing worse than usual.  This past week has been significantly worse.  Patient gets sob with moving around too much or going into cold air.    Patient had a runny nose for 2 weeks.  Unknown fever.   Reports a history of pneumonia last year, patient concerned this is what may be wrong now and doesn't want to let it get that bad.    Chest does not hurt, but tight and has noticed wheezing.  Patient saw pcp the beginnig of November and had wheezing-was given an antibiotic

## 2022-04-17 DIAGNOSIS — E1165 Type 2 diabetes mellitus with hyperglycemia: Secondary | ICD-10-CM | POA: Diagnosis not present

## 2022-04-19 ENCOUNTER — Encounter: Payer: Self-pay | Admitting: Internal Medicine

## 2022-04-19 ENCOUNTER — Ambulatory Visit (INDEPENDENT_AMBULATORY_CARE_PROVIDER_SITE_OTHER): Payer: Medicare Other | Admitting: Internal Medicine

## 2022-04-19 VITALS — BP 112/60 | HR 79 | Temp 97.6°F | Ht 67.0 in | Wt 186.6 lb

## 2022-04-19 DIAGNOSIS — F17200 Nicotine dependence, unspecified, uncomplicated: Secondary | ICD-10-CM | POA: Diagnosis not present

## 2022-04-19 DIAGNOSIS — J432 Centrilobular emphysema: Secondary | ICD-10-CM | POA: Diagnosis not present

## 2022-04-19 DIAGNOSIS — J189 Pneumonia, unspecified organism: Secondary | ICD-10-CM

## 2022-04-19 NOTE — Progress Notes (Signed)
Date:  04/19/2022   Name:  Paul Stokes   DOB:  1956-04-30   MRN:  [id number]   Chief Complaint: Pneumonia  Pneumonia He complains of chest tightness, cough, difficulty breathing and shortness of breath. There is no hemoptysis or wheezing. This is a new problem. The current episode started 1 to 4 weeks ago. The problem has been gradually improving. Associated symptoms include dyspnea on exertion and malaise/fatigue. Pertinent negatives include no appetite change, chest pain, fever, headaches, sore throat or weight loss.    Lab Results  Component Value Date   NA 139 05/26/2021   K 4.8 05/26/2021   CO2 25 05/26/2021   GLUCOSE 152 (H) 05/26/2021   BUN 14 05/26/2021   CREATININE 1.13 05/26/2021   CALCIUM 9.6 05/26/2021   EGFR 72 05/26/2021   GFRNONAA >60 03/30/2021   Lab Results  Component Value Date   CHOL 159 05/26/2021   HDL 58 05/26/2021   LDLCALC 88 05/26/2021   TRIG 69 05/26/2021   CHOLHDL 2.7 05/26/2021   Lab Results  Component Value Date   TSH 1.510 08/06/2020   Lab Results  Component Value Date   HGBA1C 8.1 (H) 05/26/2021   Lab Results  Component Value Date   WBC 10.1 06/22/2021   HGB 14.4 06/22/2021   HCT 43.2 06/22/2021   MCV 88.2 06/22/2021   PLT 374 06/22/2021   Lab Results  Component Value Date   ALT 19 05/26/2021   AST 13 05/26/2021   ALKPHOS 124 (H) 05/26/2021   BILITOT 0.3 05/26/2021   No results found for: "25OHVITD2", "25OHVITD3", "VD25OH"   Review of Systems  Constitutional:  Positive for fatigue and malaise/fatigue. Negative for appetite change, chills, fever and weight loss.  HENT:  Negative for sore throat.   Respiratory:  Positive for cough, chest tightness and shortness of breath. Negative for hemoptysis and wheezing.   Cardiovascular:  Positive for dyspnea on exertion. Negative for chest pain and palpitations.  Gastrointestinal:  Negative for diarrhea, nausea and vomiting.  Neurological:  Negative for dizziness and headaches.   Psychiatric/Behavioral:  Negative for dysphoric mood and sleep disturbance. The patient is not nervous/anxious.     Patient Active Problem List   Diagnosis Date Noted   Benign essential tremor 07/26/2021   History of gross hematuria 01/05/2021   Centrilobular emphysema ([place]) 12/11/2019   Personal history of colonic polyps    Aortic atherosclerosis ([place]) 08/27/2019   Dyspepsia 08/06/2018   Acute bilateral low back pain with right-sided sciatica 11/29/2017   Mood disorder ([place]) 11/15/2016   BPH with obstruction/lower urinary tract symptoms 06/15/2015   Hyperlipidemia due to type 1 diabetes mellitus ([place]) 06/15/2015   Insomnia, persistent 03/06/2015   Compulsive tobacco user syndrome 03/06/2015   Type 1 diabetes mellitus with sensory neuropathy ([place]) 03/06/2015   Presence of insulin pump 06/08/2014    Allergies  Allergen Reactions   Lisinopril Cough    Past Surgical History:  Procedure Laterality Date   CATARACT EXTRACTION, BILATERAL     COLONOSCOPY  08/14/2013   7 tubular adenomas   COLONOSCOPY WITH PROPOFOL N/A 09/10/2019   Procedure: COLONOSCOPY WITH PROPOFOL;  Surgeon: [name], MD;  Location: [facility] ENDOSCOPY;  Service: Endoscopy;  Laterality: N/A;   COLONOSCOPY WITH PROPOFOL N/A 09/11/2019   Procedure: COLONOSCOPY WITH PROPOFOL;  Surgeon: Jonathon Bellows, MD;  Location: [facility] ENDOSCOPY;  Service: Gastroenterology;  Laterality: N/A;   HOLEP-LASER ENUCLEATION OF THE PROSTATE WITH MORCELLATION N/A 04/02/2021   Procedure: HOLEP-LASER ENUCLEATION OF THE  PROSTATE WITH MORCELLATION;  Surgeon: Billey Co, MD;  Location: [facility] ORS;  Service: Urology;  Laterality: N/A;    Social History   Tobacco Use   Smoking status: Some Days    Packs/day: 0.25    Years: 47.00    Total pack years: 11.75    Types: Cigarettes    Start date: 05/12/1968    Last attempt to quit: 06/08/2021    Years since quitting: 0.8    Passive exposure: Past   Smokeless tobacco: Never  Vaping Use    Vaping Use: Never used  Substance Use Topics   Alcohol use: No    Alcohol/week: 0.0 standard drinks of alcohol   Drug use: No     Medication list has been reviewed and updated.  Current Meds  Medication Sig   albuterol (PROVENTIL) (2.5 MG/3ML) 0.083% nebulizer solution Take 3 mLs (2.5 mg total) by nebulization every 4 (four) hours as needed for wheezing or shortness of breath.   albuterol (VENTOLIN HFA) 108 (90 Base) MCG/ACT inhaler Inhale 2 puffs into the lungs every 4 (four) hours as needed for wheezing or shortness of breath.   aspirin EC 81 MG tablet Take 81 mg by mouth daily. Swallow whole.   BAYER CONTOUR NEXT TEST test strip    buPROPion (WELLBUTRIN SR) 150 MG 12 hr tablet TAKE 1 TABLET BY MOUTH EVERY MORNING AND2 TABLETS AT BEDTIME   Continuous Blood Gluc Sensor (DEXCOM G6 SENSOR) MISC by Does not apply route.   Cyanocobalamin 5000 MCG/ML LIQD Place under the tongue daily. Liquid   fluticasone-salmeterol (ADVAIR HFA) 45-21 MCG/ACT inhaler Inhale 2 puffs into the lungs 2 (two) times daily.   gabapentin (NEURONTIN) 300 MG capsule TAKE 2 CAPULES BY MOUTH THREE TIMES DAILY   HUMALOG 100 UNIT/ML injection Inject 80 Units into the skin See admin instructions. 80 units per day through insulin pump   losartan (COZAAR) 25 MG tablet TAKE 1 TABLET BY MOUTH ONCE DAILY   pravastatin (PRAVACHOL) 40 MG tablet TAKE 1 TABLET BY MOUTH DAILY   Spacer/Aero-Holding Chambers (AEROCHAMBER MV) inhaler Use as instructed   tadalafil (CIALIS) 5 MG tablet Take 1 tablet (5 mg total) by mouth daily as needed for erectile dysfunction.   VITAMIN D PO Take 5,000 Units by mouth daily.   zolpidem (AMBIEN) 10 MG tablet TAKE 1 TABLET BY MOUTH AT BEDTIME.   [DISCONTINUED] guaiFENesin (MUCINEX PO) Take by mouth as needed.       04/19/2022   11:33 AM 03/09/2022    9:07 AM 07/26/2021    1:24 PM 06/22/2021    8:25 AM  GAD 7 : Generalized Anxiety Score  Nervous, Anxious, on Edge 1 0 1 0  Control/stop worrying 1 0 1  0  Worry too much - different things 1 0 1 0  Trouble relaxing 3 0 1 0  Restless 3 0 0 0  Easily annoyed or irritable 2 1 0 0  Afraid - awful might happen 0 0 0 0  Total GAD 7 Score _0 0  Anxiety Difficulty Extremely difficult Not difficult at all Not difficult at all Not difficult at all       04/19/2022   11:33 AM 03/09/2022    9:06 AM 01/07/2022    8:54 AM  Depression screen PHQ 2/9  Decreased Interest 3 1 0  Down, Depressed, Hopeless 0 0 0  PHQ - 2 Score 3 1 0  Altered sleeping _1 Tired, decreased energy 3 3 0  Change in appetite 3 0 0  Feeling bad or failure about yourself  0 0 0  Trouble concentrating 1 0 0  Moving slowly or fidgety/restless 3 0 0  Suicidal thoughts 0 0 0  PHQ-9 Score _0 Difficult doing work/chores Extremely dIfficult Not difficult at all Not difficult at all    BP Readings from Last 3 Encounters:  04/19/22 112/60  04/12/22 (!) 140/71  03/09/22 110/64    Physical Exam Constitutional:      Appearance: Normal appearance.  Neck:     Vascular: No carotid bruit.  Cardiovascular:     Rate and Rhythm: Normal rate and regular rhythm.  Pulmonary:     Breath sounds: Examination of the right-middle field reveals decreased breath sounds. Decreased breath sounds present. No wheezing or rhonchi.  Musculoskeletal:     Cervical back: Normal range of motion.  Lymphadenopathy:     Cervical: No cervical adenopathy.  Neurological:     Mental Status: He is alert.     Wt Readings from Last 3 Encounters:  04/19/22 186 lb 9.6 oz (84.6 kg)  03/09/22 182 lb (82.6 kg)  01/07/22 185 lb (83.9 kg)    BP 112/60   Pulse 79   Temp 97.6 F (36.4 C) (Oral)   Ht _1  (1.702 m)   Wt 186 lb 9.6 oz (84.6 kg)   SpO2 97%   BMI 29.23 kg/m   Assessment and Plan: 1. Community acquired pneumonia of right middle lobe of lung Recently completed Levaquin 750 mg x 5 days and steroid taper He continues to use nebulizer and albuterol. He is still fatigued and  sleeping more.  No fever or chills. Clinically improving with normal O2 sats Recommend continue activity as tolerated Follow up in 5 weeks for CXR   2. Centrilobular emphysema ([place]) Advair is #95 per month Sample of Breztri given - one puff twice a day - call for Rx if helpful  3. Compulsive tobacco user syndrome He is using Bupropion and nicotine patches to help quit No cigarettes in the past 2 days and feels determined to quit completely   Partially dictated using Editor, commissioning. Any errors are unintentional.  Halina Maidens, MD [facility]  04/19/2022

## 2022-04-21 DIAGNOSIS — E1065 Type 1 diabetes mellitus with hyperglycemia: Secondary | ICD-10-CM | POA: Diagnosis not present

## 2022-05-02 ENCOUNTER — Observation Stay
Admission: EM | Admit: 2022-05-02 | Discharge: 2022-05-03 | Disposition: A | Discharge status: Home / Self Care | Payer: Medicare Other | Attending: Internal Medicine | Admitting: Internal Medicine

## 2022-05-02 ENCOUNTER — Other Ambulatory Visit: Payer: Self-pay

## 2022-05-02 ENCOUNTER — Ambulatory Visit
Admission: EM | Admit: 2022-05-02 | Discharge: 2022-05-02 | Disposition: A | Discharge status: Home / Self Care | Payer: Medicare Other

## 2022-05-02 ENCOUNTER — Emergency Department: Payer: Medicare Other

## 2022-05-02 DIAGNOSIS — Z1152 Encounter for screening for COVID-19: Secondary | ICD-10-CM | POA: Insufficient documentation

## 2022-05-02 DIAGNOSIS — J9601 Acute respiratory failure with hypoxia: Secondary | ICD-10-CM | POA: Diagnosis not present

## 2022-05-02 DIAGNOSIS — Z7982 Long term (current) use of aspirin: Secondary | ICD-10-CM | POA: Insufficient documentation

## 2022-05-02 DIAGNOSIS — J9621 Acute and chronic respiratory failure with hypoxia: Secondary | ICD-10-CM | POA: Diagnosis not present

## 2022-05-02 DIAGNOSIS — E785 Hyperlipidemia, unspecified: Secondary | ICD-10-CM | POA: Insufficient documentation

## 2022-05-02 DIAGNOSIS — J441 Chronic obstructive pulmonary disease with (acute) exacerbation: Principal | ICD-10-CM | POA: Insufficient documentation

## 2022-05-02 DIAGNOSIS — E1040 Type 1 diabetes mellitus with diabetic neuropathy, unspecified: Secondary | ICD-10-CM | POA: Diagnosis not present

## 2022-05-02 DIAGNOSIS — Z87891 Personal history of nicotine dependence: Secondary | ICD-10-CM | POA: Insufficient documentation

## 2022-05-02 DIAGNOSIS — I10 Essential (primary) hypertension: Secondary | ICD-10-CM | POA: Insufficient documentation

## 2022-05-02 DIAGNOSIS — J449 Chronic obstructive pulmonary disease, unspecified: Secondary | ICD-10-CM | POA: Diagnosis present

## 2022-05-02 DIAGNOSIS — R0600 Dyspnea, unspecified: Secondary | ICD-10-CM | POA: Diagnosis not present

## 2022-05-02 DIAGNOSIS — Z794 Long term (current) use of insulin: Secondary | ICD-10-CM | POA: Diagnosis not present

## 2022-05-02 DIAGNOSIS — R059 Cough, unspecified: Secondary | ICD-10-CM | POA: Diagnosis not present

## 2022-05-02 DIAGNOSIS — F32A Depression, unspecified: Secondary | ICD-10-CM | POA: Insufficient documentation

## 2022-05-02 DIAGNOSIS — F324 Major depressive disorder, single episode, in partial remission: Secondary | ICD-10-CM | POA: Insufficient documentation

## 2022-05-02 DIAGNOSIS — J09X1 Influenza due to identified novel influenza A virus with pneumonia: Secondary | ICD-10-CM

## 2022-05-02 LAB — COMPREHENSIVE METABOLIC PANEL
ALT: 27 U/L (ref 0–44)
AST: 46 U/L — ABNORMAL HIGH (ref 15–41)
Albumin: 3.7 g/dL (ref 3.5–5.0)
Alkaline Phosphatase: 71 U/L (ref 38–126)
Anion gap: 10 (ref 5–15)
BUN: 20 mg/dL (ref 8–23)
CO2: 26 mmol/L (ref 22–32)
Calcium: 8.9 mg/dL (ref 8.9–10.3)
Chloride: 99 mmol/L (ref 98–111)
Creatinine, Ser: 1.06 mg/dL (ref 0.61–1.24)
GFR, Estimated: >60 mL/min (ref >60)
Glucose, Bld: 165 mg/dL — ABNORMAL HIGH (ref 70–99)
Potassium: 4.1 mmol/L (ref 3.5–5.1)
Sodium: 135 mmol/L (ref 135–145)
Total Bilirubin: 1.3 mg/dL — ABNORMAL HIGH (ref 0.3–1.2)
Total Protein: 6.9 g/dL (ref 6.5–8.1)

## 2022-05-02 LAB — CBC WITH DIFFERENTIAL/PLATELET
Abs Immature Granulocytes: 0.03 10*3/uL (ref 0.00–0.07)
Basophils Absolute: 0.1 10*3/uL (ref 0.0–0.1)
Basophils Relative: 1 %
Eosinophils Absolute: 0.1 10*3/uL (ref 0.0–0.5)
Eosinophils Relative: 1 %
HCT: 43.2 % (ref 39.0–52.0)
Hemoglobin: 14.4 g/dL (ref 13.0–17.0)
Immature Granulocytes: 1 %
Lymphocytes Relative: 19 %
Lymphs Abs: 1.1 10*3/uL (ref 0.7–4.0)
MCH: 29.9 pg (ref 26.0–34.0)
MCHC: 33.3 g/dL (ref 30.0–36.0)
MCV: 89.8 fL (ref 80.0–100.0)
Monocytes Absolute: 1 10*3/uL (ref 0.1–1.0)
Monocytes Relative: 16 %
Neutro Abs: 3.8 10*3/uL (ref 1.7–7.7)
Neutrophils Relative %: 62 %
Platelets: 272 10*3/uL (ref 150–400)
RBC: 4.81 MIL/uL (ref 4.22–5.81)
RDW: 11.4 % — ABNORMAL LOW (ref 11.5–15.5)
WBC: 6.1 10*3/uL (ref 4.0–10.5)
nRBC: 0 % (ref 0.0–0.2)

## 2022-05-02 LAB — TROPONIN I (HIGH SENSITIVITY): Troponin I (High Sensitivity): 6 ng/L (ref <18)

## 2022-05-02 LAB — LACTIC ACID, PLASMA
Lactic Acid, Venous: 1.6 mmol/L (ref 0.5–1.9)
Lactic Acid, Venous: 1.4 mmol/L (ref 0.5–1.9)

## 2022-05-02 LAB — RESP PANEL BY RT-PCR (RSV, FLU A&B, COVID)  RVPGX2
Influenza A by PCR: POSITIVE — AB
Influenza B by PCR: NEGATIVE
Resp Syncytial Virus by PCR: NEGATIVE
SARS Coronavirus 2 by RT PCR: NEGATIVE

## 2022-05-02 LAB — CBG MONITORING, ED: Glucose-Capillary: 196 mg/dL — ABNORMAL HIGH (ref 70–99)

## 2022-05-02 LAB — PROCALCITONIN: Procalcitonin: 0.22 ng/mL

## 2022-05-02 MED ORDER — IPRATROPIUM-ALBUTEROL 0.5-2.5 (3) MG/3ML IN SOLN
3.0000 mL | Freq: Once | RESPIRATORY_TRACT | Status: AC
  Administered 2022-05-02: 3 mL via RESPIRATORY_TRACT
  Filled 2022-05-02: qty 3

## 2022-05-02 MED ORDER — MAGNESIUM HYDROXIDE 400 MG/5ML PO SUSP: 30.0000 mL | Freq: Every day | ORAL | Status: DC | PRN

## 2022-05-02 MED ORDER — METHYLPREDNISOLONE SODIUM SUCC 40 MG IJ SOLR
40.0000 mg | Freq: Two times a day (BID) | INTRAMUSCULAR | Status: DC
  Administered 2022-05-02: 40 mg via INTRAVENOUS
  Filled 2022-05-02 (×2): qty 1

## 2022-05-02 MED ORDER — INSULIN ASPART 100 UNIT/ML IJ SOLN: 0.0000 [IU] | Freq: Every day | INTRAMUSCULAR | Status: DC

## 2022-05-02 MED ORDER — BUPROPION HCL ER (SR) 150 MG PO TB12
300.0000 mg | ORAL_TABLET | Freq: Every day | ORAL | Status: DC
  Administered 2022-05-02: 300 mg via ORAL
  Filled 2022-05-02: qty 2

## 2022-05-02 MED ORDER — SODIUM CHLORIDE 0.9 % IV SOLN: INTRAVENOUS | Status: DC

## 2022-05-02 MED ORDER — INSULIN ASPART 100 UNIT/ML IJ SOLN
0.0000 [IU] | Freq: Three times a day (TID) | INTRAMUSCULAR | Status: DC
  Filled 2022-05-02: qty 1

## 2022-05-02 MED ORDER — BUPROPION HCL ER (SR) 150 MG PO TB12
150.0000 mg | ORAL_TABLET | Freq: Every morning | ORAL | Status: DC
  Administered 2022-05-03: 150 mg via ORAL
  Filled 2022-05-02: qty 1

## 2022-05-02 MED ORDER — GUAIFENESIN ER 600 MG PO TB12
600.0000 mg | ORAL_TABLET | Freq: Two times a day (BID) | ORAL | Status: DC
  Administered 2022-05-02 – 2022-05-03 (×2): 600 mg via ORAL
  Filled 2022-05-02 (×2): qty 1

## 2022-05-02 MED ORDER — ENOXAPARIN SODIUM 40 MG/0.4ML IJ SOSY
40.0000 mg | PREFILLED_SYRINGE | INTRAMUSCULAR | Status: DC
  Administered 2022-05-02: 40 mg via SUBCUTANEOUS
  Filled 2022-05-02: qty 0.4

## 2022-05-02 MED ORDER — ONDANSETRON HCL 4 MG/2ML IJ SOLN
4.0000 mg | Freq: Once | INTRAMUSCULAR | Status: AC
  Administered 2022-05-02: 4 mg via INTRAVENOUS
  Filled 2022-05-02: qty 2

## 2022-05-02 MED ORDER — VITAMIN D 25 MCG (1000 UNIT) PO TABS
5000.0000 [IU] | ORAL_TABLET | Freq: Every day | ORAL | Status: DC
  Administered 2022-05-02 – 2022-05-03 (×2): 5000 [IU] via ORAL
  Filled 2022-05-02 (×2): qty 5

## 2022-05-02 MED ORDER — ZOLPIDEM TARTRATE 5 MG PO TABS
5.0000 mg | ORAL_TABLET | Freq: Every day | ORAL | Status: DC
  Administered 2022-05-02: 5 mg via ORAL
  Filled 2022-05-02: qty 1

## 2022-05-02 MED ORDER — CYANOCOBALAMIN 5000 MCG/ML SL LIQD: Freq: Every day | SUBLINGUAL | Status: DC

## 2022-05-02 MED ORDER — TRAZODONE HCL 50 MG PO TABS: 25.0000 mg | ORAL_TABLET | Freq: Every evening | ORAL | Status: DC | PRN

## 2022-05-02 MED ORDER — PREDNISONE 20 MG PO TABS: 40.0000 mg | ORAL_TABLET | Freq: Every day | ORAL | Status: DC

## 2022-05-02 MED ORDER — GABAPENTIN 300 MG PO CAPS
600.0000 mg | ORAL_CAPSULE | Freq: Three times a day (TID) | ORAL | Status: DC
  Administered 2022-05-02 – 2022-05-03 (×2): 600 mg via ORAL
  Filled 2022-05-02 (×2): qty 2

## 2022-05-02 MED ORDER — ASPIRIN 81 MG PO TBEC
81.0000 mg | DELAYED_RELEASE_TABLET | Freq: Every day | ORAL | Status: DC
  Administered 2022-05-02 – 2022-05-03 (×2): 81 mg via ORAL
  Filled 2022-05-02 (×2): qty 1

## 2022-05-02 MED ORDER — LOSARTAN POTASSIUM 50 MG PO TABS
25.0000 mg | ORAL_TABLET | Freq: Every day | ORAL | Status: DC
  Administered 2022-05-02 – 2022-05-03 (×2): 25 mg via ORAL
  Filled 2022-05-02 (×2): qty 1

## 2022-05-02 MED ORDER — ONDANSETRON HCL 4 MG/2ML IJ SOLN: 4.0000 mg | Freq: Four times a day (QID) | INTRAMUSCULAR | Status: DC | PRN

## 2022-05-02 MED ORDER — IPRATROPIUM-ALBUTEROL 0.5-2.5 (3) MG/3ML IN SOLN
3.0000 mL | Freq: Four times a day (QID) | RESPIRATORY_TRACT | Status: DC
  Administered 2022-05-02 – 2022-05-03 (×3): 3 mL via RESPIRATORY_TRACT
  Filled 2022-05-02 (×4): qty 3

## 2022-05-02 MED ORDER — AZITHROMYCIN 500 MG PO TABS: 500.0000 mg | ORAL_TABLET | Freq: Every day | ORAL | Status: DC

## 2022-05-02 MED ORDER — ONDANSETRON HCL 4 MG PO TABS: 4.0000 mg | ORAL_TABLET | Freq: Four times a day (QID) | ORAL | Status: DC | PRN

## 2022-05-02 MED ORDER — SODIUM CHLORIDE 0.9 % IV BOLUS
1000.0000 mL | Freq: Once | INTRAVENOUS | Status: AC
  Administered 2022-05-02: 1000 mL via INTRAVENOUS

## 2022-05-02 MED ORDER — CYANOCOBALAMIN 500 MCG PO TABS
5000.0000 ug | ORAL_TABLET | Freq: Every day | ORAL | Status: DC
  Administered 2022-05-03: 5000 ug via ORAL
  Filled 2022-05-02: qty 10

## 2022-05-02 MED ORDER — METHYLPREDNISOLONE SODIUM SUCC 125 MG IJ SOLR
125.0000 mg | Freq: Once | INTRAMUSCULAR | Status: AC
  Administered 2022-05-02: 125 mg via INTRAVENOUS
  Filled 2022-05-02: qty 2

## 2022-05-02 MED ORDER — ACETAMINOPHEN 325 MG PO TABS: 650.0000 mg | ORAL_TABLET | Freq: Four times a day (QID) | ORAL | Status: DC | PRN

## 2022-05-02 MED ORDER — SODIUM CHLORIDE 0.9 % IV SOLN
500.0000 mg | INTRAVENOUS | Status: AC
  Administered 2022-05-02: 500 mg via INTRAVENOUS
  Filled 2022-05-02: qty 5

## 2022-05-02 MED ORDER — PRAVASTATIN SODIUM 20 MG PO TABS
40.0000 mg | ORAL_TABLET | Freq: Every day | ORAL | Status: DC
  Administered 2022-05-02 – 2022-05-03 (×2): 40 mg via ORAL
  Filled 2022-05-02 (×2): qty 2

## 2022-05-02 MED ORDER — HYDROCOD POLI-CHLORPHE POLI ER 10-8 MG/5ML PO SUER
5.0000 mL | Freq: Two times a day (BID) | ORAL | Status: DC | PRN
  Administered 2022-05-03: 5 mL via ORAL
  Filled 2022-05-02: qty 5

## 2022-05-02 MED ORDER — ACETAMINOPHEN 650 MG RE SUPP: 650.0000 mg | Freq: Four times a day (QID) | RECTAL | Status: DC | PRN

## 2022-05-02 NOTE — ED Notes (Signed)
Patient is being discharged from the Urgent Care and sent to the Emergency Department via POV . Per Annie Main, patient is in need of higher level of care due to providers assessment. Patient is aware and verbalizes understanding of plan of care. There were no vitals filed for this visit.

## 2022-05-02 NOTE — ED Notes (Addendum)
O2 88-90 while ambulating

## 2022-05-02 NOTE — ED Provider Notes (Signed)
Paul Stokes    CSN: [id number] Arrival date & time: 05/02/22  1314      History   Chief Complaint No chief complaint on file.   HPI Paul Stokes is a 66 y.o. male.   HPI  Patient is to urgent care with report of continued fatigue, fever, difficulty breathing after treatment with Levaquin on 1128 for community-acquired pneumonia of right middle lobe.  Patient was seen by PCP on 04/19/2022 and told to continue with nebulizer and albuterol, Advair, given sample of Breztri.  He was told at that time that there was no other antibiotic treatment available for him.  Past Medical History:  Diagnosis Date   Depression    Diabetes mellitus without complication ([place])    type 1; insulin pump   Emphysema of lung ([facility])    GERD (gastroesophageal reflux disease)    Hyperlipidemia    Prostate disease     Patient Active Problem List   Diagnosis Date Noted   Benign essential tremor 07/26/2021   History of gross hematuria 01/05/2021   Centrilobular emphysema ([place]) 12/11/2019   Personal history of colonic polyps    Aortic atherosclerosis ([place]) 08/27/2019   Dyspepsia 08/06/2018   Acute bilateral low back pain with right-sided sciatica 11/29/2017   Mood disorder ([place]) 11/15/2016   BPH with obstruction/lower urinary tract symptoms 06/15/2015   Hyperlipidemia due to type 1 diabetes mellitus ([place]) 06/15/2015   Insomnia, persistent 03/06/2015   Compulsive tobacco user syndrome 03/06/2015   Type 1 diabetes mellitus with sensory neuropathy ([place]) 03/06/2015   Presence of insulin pump 06/08/2014    Past Surgical History:  Procedure Laterality Date   CATARACT EXTRACTION, BILATERAL     COLONOSCOPY  08/14/2013   7 tubular adenomas   COLONOSCOPY WITH PROPOFOL N/A 09/10/2019   Procedure: COLONOSCOPY WITH PROPOFOL;  Surgeon: [name], MD;  Location: [facility] ENDOSCOPY;  Service: Endoscopy;  Laterality: N/A;   COLONOSCOPY WITH PROPOFOL N/A 09/11/2019   Procedure: COLONOSCOPY WITH  PROPOFOL;  Surgeon: Jonathon Bellows, MD;  Location: [facility] ENDOSCOPY;  Service: Gastroenterology;  Laterality: N/A;   HOLEP-LASER ENUCLEATION OF THE PROSTATE WITH MORCELLATION N/A 04/02/2021   Procedure: HOLEP-LASER ENUCLEATION OF THE PROSTATE WITH MORCELLATION;  Surgeon: Billey Co, MD;  Location: [facility] ORS;  Service: Urology;  Laterality: N/A;       Home Medications    Prior to Admission medications   Medication Sig Start Date End Date Taking? Authorizing Provider  albuterol (PROVENTIL) (2.5 MG/3ML) 0.083% nebulizer solution Take 3 mLs (2.5 mg total) by nebulization every 4 (four) hours as needed for wheezing or shortness of breath. 04/12/22   Melynda Ripple, MD  albuterol (VENTOLIN HFA) 108 (90 Base) MCG/ACT inhaler Inhale 2 puffs into the lungs every 4 (four) hours as needed for wheezing or shortness of breath. 04/12/22   Melynda Ripple, MD  aspirin EC 81 MG tablet Take 81 mg by mouth daily. Swallow whole.    [provider]  BAYER CONTOUR NEXT TEST test strip  01/01/15   [provider]  buPROPion (WELLBUTRIN SR) 150 MG 12 hr tablet TAKE 1 TABLET BY MOUTH EVERY MORNING AND2 TABLETS AT BEDTIME 01/07/22   Glean Hess, MD  Continuous Blood Gluc Sensor (DEXCOM G6 SENSOR) MISC by Does not apply route.    [provider]  Cyanocobalamin 5000 MCG/ML LIQD Place under the tongue daily. Liquid    [provider]  fluticasone-salmeterol (ADVAIR HFA) 45-21 MCG/ACT inhaler Inhale 2 puffs into the lungs 2 (two)  times daily. 07/26/21   Glean Hess, MD  gabapentin (NEURONTIN) 300 MG capsule TAKE 2 CAPULES BY MOUTH THREE TIMES DAILY 04/05/22   Glean Hess, MD  HUMALOG 100 UNIT/ML injection Inject 80 Units into the skin See admin instructions. 80 units per day through insulin pump 10/06/20   [provider]  losartan (COZAAR) 25 MG tablet TAKE 1 TABLET BY MOUTH ONCE DAILY 04/05/22   Glean Hess, MD  pravastatin (PRAVACHOL) 40 MG tablet  TAKE 1 TABLET BY MOUTH DAILY 02/17/22   Glean Hess, MD  Spacer/Aero-Holding Chambers (AEROCHAMBER MV) inhaler Use as instructed 04/12/22   Melynda Ripple, MD  tadalafil (CIALIS) 5 MG tablet Take 1 tablet (5 mg total) by mouth daily as needed for erectile dysfunction. 12/09/21   Billey Co, MD  VITAMIN D PO Take 5,000 Units by mouth daily.    [provider]  zolpidem (AMBIEN) 10 MG tablet TAKE 1 TABLET BY MOUTH AT BEDTIME. 02/18/22   Glean Hess, MD    Family History Family History  Problem Relation Age of Onset   Diabetes Mother    Diabetes Father     Social History Social History   Tobacco Use   Smoking status: Some Days    Packs/day: 0.25    Years: 47.00    Total pack years: 11.75    Types: Cigarettes    Start date: 05/12/1968    Last attempt to quit: 06/08/2021    Years since quitting: 0.8    Passive exposure: Past   Smokeless tobacco: Never  Vaping Use   Vaping Use: Never used  Substance Use Topics   Alcohol use: No    Alcohol/week: 0.0 standard drinks of alcohol   Drug use: No     Allergies   Lisinopril   Review of Systems Review of Systems   Physical Exam Triage Vital Signs ED Triage Vitals  Enc Vitals Group     BP      Pulse      Resp      Temp      Temp src      SpO2      Weight      Height      Head Circumference      Peak Flow      Pain Score      Pain Loc      Pain Edu?      Excl. in [place]?    No data found.  Updated Vital Signs There were no vitals taken for this visit.  Visual Acuity Right Eye Distance:   Left Eye Distance:   Bilateral Distance:    Right Eye Near:   Left Eye Near:    Bilateral Near:     Physical Exam Vitals reviewed.  Constitutional:      Appearance: He is ill-appearing.  Cardiovascular:     Rate and Rhythm: Normal rate and regular rhythm.  Pulmonary:     Effort: Pulmonary effort is normal.     Breath sounds: Wheezing and rhonchi present.  Skin:    General: Skin is warm and  dry.  Neurological:     General: No focal deficit present.     Mental Status: He is alert and oriented to person, place, and time.  Psychiatric:        Mood and Affect: Mood normal.        Behavior: Behavior normal.      UC Treatments / Results  Labs (all  labs ordered are listed, but only abnormal results are displayed) Labs Reviewed - No data to display  EKG   Radiology No results found.  Procedures Procedures (including critical care time)  Medications Ordered in UC Medications - No data to display  Initial Impression / Assessment and Plan / UC Course  I have reviewed the triage vital signs and the nursing notes.  Pertinent labs & imaging results that were available during my care of the patient were reviewed by me and considered in my medical decision making (see chart for details).   Patient is afebrile here without recent antipyretics. Satting 96% on room air. Overall is ill appearing, though well hydrated and without respiratory distress. Pulmonary exam is remarkable for severe cough, rhonchorus breath sounds especially on exhalation.   Concern for failed treatment of community-acquired pneumonia versus COPD exacerbation.  He has already completed treatment with Levaquin 2 weeks ago and am referring patient to ED for evaluation.  Final Clinical Impressions(s) / UC Diagnoses   Final diagnoses:  None   Discharge Instructions   None    ED Prescriptions   None    PDMP not reviewed this encounter.   Rose Phi, [place] 05/02/22 1445

## 2022-05-02 NOTE — H&P (Signed)
Paul Stokes   PATIENT NAME: Paul Stokes    MR#:  [id number]  DATE OF BIRTH:  1955-10-06  DATE OF ADMISSION:  05/02/2022  PRIMARY CARE PHYSICIAN: Glean Hess, MD   Patient is coming from: Home  REQUESTING/REFERRING PHYSICIAN: Duffy Bruce, MD  CHIEF COMPLAINT:   Chief Complaint  Patient presents with   Cough    HISTORY OF PRESENT ILLNESS:  Paul Stokes is a 66 y.o. Caucasian male with medical history significant for COPD, GERD, dyslipidemia, depression, type 1 diabetes mellitus on insulin pump, presented to the ER with acute onset of worsening dyspnea with associated cough occasionally productive of clear sputum with wheezing for the last 4 days.  He admitted to generalized body aching as well as occasional nausea and vomiting with diarrhea.  He had fever and chills yesterday.  He denies any abdominal pain.  No chest pain or palpitations.  No dysuria, oliguria or hematuria or flank pain.  ED course: When he came to the ER respiratory rate was 22 with otherwise normal vital signs and later on pulse oximetry was down to 88% and up to 99% on 2 L of O2 nasal cannula.  Labs revealed positive influenza A antigen and negative influenza B, RSV PCR and COVID-19 PCR.  CBC was within normal.  Procalcitonin was 0.22 and lactic acid 1.6 and later 1.4.  High sensitive troponin I was 6 CMP was remarkable for a blood glucose of 165 and AST 46 with total bili 1.3.  The EKG as reviewed by me : Normal sinus rhythm with a rate of 99 with Q waves septally. Imaging: Chest x-ray showed no acute cardiopulmonary disease.  The patient was given DuoNebs X.3, 125 mg of IV Solu-Medrol, 4 mg of IV Zofran and 1 L bolus of IV normal saline.  He will be admitted to a medical telemetry bed for further evaluation and management PAST MEDICAL HISTORY:   Past Medical History:  Diagnosis Date   Depression    Diabetes mellitus without complication ([place])    type 1; insulin pump   Emphysema of lung (HCC)     GERD (gastroesophageal reflux disease)    Hyperlipidemia    Prostate disease     PAST SURGICAL HISTORY:   Past Surgical History:  Procedure Laterality Date   CATARACT EXTRACTION, BILATERAL     COLONOSCOPY  08/14/2013   7 tubular adenomas   COLONOSCOPY WITH PROPOFOL N/A 09/10/2019   Procedure: COLONOSCOPY WITH PROPOFOL;  Surgeon: [name], MD;  Location: [facility] ENDOSCOPY;  Service: Endoscopy;  Laterality: N/A;   COLONOSCOPY WITH PROPOFOL N/A 09/11/2019   Procedure: COLONOSCOPY WITH PROPOFOL;  Surgeon: Jonathon Bellows, MD;  Location: [facility] ENDOSCOPY;  Service: Gastroenterology;  Laterality: N/A;   HOLEP-LASER ENUCLEATION OF THE PROSTATE WITH MORCELLATION N/A 04/02/2021   Procedure: HOLEP-LASER ENUCLEATION OF THE PROSTATE WITH MORCELLATION;  Surgeon: Billey Co, MD;  Location: [facility] ORS;  Service: Urology;  Laterality: N/A;    SOCIAL HISTORY:   Social History   Tobacco Use   Smoking status: Some Days    Packs/day: 0.25    Years: 47.00    Total pack years: 11.75    Types: Cigarettes    Start date: 05/12/1968    Last attempt to quit: 06/08/2021    Years since quitting: 0.9    Passive exposure: Past   Smokeless tobacco: Never  Substance Use Topics   Alcohol use: No    Alcohol/week: 0.0 standard drinks of alcohol  FAMILY HISTORY:   Family History  Problem Relation Age of Onset   Diabetes Mother    Diabetes Father     DRUG ALLERGIES:   Allergies  Allergen Reactions   Lisinopril Cough    REVIEW OF SYSTEMS:   ROS As per history of present illness. All pertinent systems were reviewed above. Constitutional, HEENT, cardiovascular, respiratory, GI, GU, musculoskeletal, neuro, psychiatric, endocrine, integumentary and hematologic systems were reviewed and are otherwise negative/unremarkable except for positive findings mentioned above in the HPI.   MEDICATIONS AT HOME:   Prior to Admission medications   Medication Sig Start Date End Date Taking? Authorizing  Provider  albuterol (PROVENTIL) (2.5 MG/3ML) 0.083% nebulizer solution Take 3 mLs (2.5 mg total) by nebulization every 4 (four) hours as needed for wheezing or shortness of breath. 04/12/22   Melynda Ripple, MD  albuterol (VENTOLIN HFA) 108 (90 Base) MCG/ACT inhaler Inhale 2 puffs into the lungs every 4 (four) hours as needed for wheezing or shortness of breath. 04/12/22   Melynda Ripple, MD  aspirin EC 81 MG tablet Take 81 mg by mouth daily. Swallow whole.    [provider]  BAYER CONTOUR NEXT TEST test strip  01/01/15   [provider]  buPROPion (WELLBUTRIN SR) 150 MG 12 hr tablet TAKE 1 TABLET BY MOUTH EVERY MORNING AND2 TABLETS AT BEDTIME 01/07/22   Glean Hess, MD  Continuous Blood Gluc Sensor (DEXCOM G6 SENSOR) MISC by Does not apply route.    [provider]  Cyanocobalamin 5000 MCG/ML LIQD Place under the tongue daily. Liquid    [provider]  fluticasone-salmeterol (ADVAIR HFA) 45-21 MCG/ACT inhaler Inhale 2 puffs into the lungs 2 (two) times daily. 07/26/21   Glean Hess, MD  gabapentin (NEURONTIN) 300 MG capsule TAKE 2 CAPULES BY MOUTH THREE TIMES DAILY 04/05/22   Glean Hess, MD  HUMALOG 100 UNIT/ML injection Inject 80 Units into the skin See admin instructions. 80 units per day through insulin pump 10/06/20   [provider]  losartan (COZAAR) 25 MG tablet TAKE 1 TABLET BY MOUTH ONCE DAILY 04/05/22   Glean Hess, MD  pravastatin (PRAVACHOL) 40 MG tablet TAKE 1 TABLET BY MOUTH DAILY 02/17/22   Glean Hess, MD  Spacer/Aero-Holding Chambers (AEROCHAMBER MV) inhaler Use as instructed 04/12/22   Melynda Ripple, MD  tadalafil (CIALIS) 5 MG tablet Take 1 tablet (5 mg total) by mouth daily as needed for erectile dysfunction. 12/09/21   Billey Co, MD  VITAMIN D PO Take 5,000 Units by mouth daily.    [provider]  zolpidem (AMBIEN) 10 MG tablet TAKE 1 TABLET BY MOUTH AT BEDTIME. 02/18/22   Glean Hess, MD      VITAL SIGNS:  Blood pressure (!) 121/55, pulse 86, temperature 98.5 F (36.9 C), temperature source Oral, resp. rate 20, height '5\' 7"'$  (1.702 m), weight 84.6 kg, SpO2 97 %.  PHYSICAL EXAMINATION:  Physical Exam  GENERAL:  66 y.o.-year-old Caucasian male patient lying in the bed with mild respiratory distress with conversational dyspnea. EYES: Pupils equal, round, reactive to light and accommodation. No scleral icterus. Extraocular muscles intact.  HEENT: Head atraumatic, normocephalic. Oropharynx and nasopharynx clear.  NECK:  Supple, no jugular venous distention. No thyroid enlargement, no tenderness.  LUNGS: Mild diminished bibasal breath sounds with diffuse extra wheezes and diminished expiratory airflow and harsh vesicular breathing.  No use of accessory muscles of respiration.  CARDIOVASCULAR: Regular rate and rhythm, S1, S2 normal. No  murmurs, rubs, or gallops.  ABDOMEN: Soft, nondistended, nontender. Bowel sounds present. No organomegaly or mass.  EXTREMITIES: No pedal edema, cyanosis, or clubbing.  NEUROLOGIC: Cranial nerves II through XII are intact. Muscle strength 5/5 in all extremities. Sensation intact. Gait not checked.  PSYCHIATRIC: The patient is alert and oriented x 3.  Normal affect and good eye contact. SKIN: No obvious rash, lesion, or ulcer.   LABORATORY PANEL:   CBC Recent Labs  Lab 05/02/22 1748  WBC 6.1  HGB 14.4  HCT 43.2  PLT 272   ------------------------------------------------------------------------------------------------------------------  Chemistries  Recent Labs  Lab 05/02/22 1748  NA 135  K 4.1  CL 99  CO2 26  GLUCOSE 165*  BUN 20  CREATININE 1.06  CALCIUM 8.9  AST 46*  ALT 27  ALKPHOS 71  BILITOT 1.3*   ------------------------------------------------------------------------------------------------------------------  Cardiac Enzymes No results for input(s): "TROPONINI" in the last 168  hours. ------------------------------------------------------------------------------------------------------------------  RADIOLOGY:  DG Chest 2 View  Result Date: 05/02/2022 CLINICAL DATA:  Cough for 2 weeks. EXAM: CHEST - 2 VIEW COMPARISON:  None Available. FINDINGS: The heart size and mediastinal contours are within normal limits. Both lungs are clear. No acute osseous abnormality. IMPRESSION: No active cardiopulmonary disease. Electronically Signed   By: Keane Police D.O.   On: 05/02/2022 17:16      IMPRESSION AND PLAN:  Assessment and Plan: * COPD exacerbation ([place]) - This like secondary to his influenza A. - He will be admitted to a medical telemetry bed. - We will continue on p.o. Tamiflu. - We will continue steroid therapy with IV Solu-Medrol. - We will continue nebulizers with DuoNebs 4 times daily and every 4 hours as needed. - Mucolytic therapy will be provided.   Acute respiratory failure with hypoxia (HCC) - This is clearly secondary to #1. - O2 protocol will be followed.  Depression - We will continue his Wellbutrin SR.  Dyslipidemia - We will continue statin therapy.  Essential hypertension - We will continue his hypertensives.  Type 1 diabetes mellitus with sensory neuropathy (HCC) - We will continue his insulin pump. - We will continue Neurontin.   DVT prophylaxis: Lovenox.  Advanced Care Planning:  Code Status: full code.  Family Communication:  The plan of care was discussed in details with the patient (and family). I answered all questions. The patient agreed to proceed with the above mentioned plan. Further management will depend upon hospital course. Disposition Plan: Back to previous home environment Consults called: none.  All the records are reviewed and case discussed with ED provider.  Status is: Inpatient.   At the time of the admission, it appears that the appropriate admission status for this patient is inpatient.  This is judged to be  reasonable and necessary in order to provide the required intensity of service to ensure the patient's safety given the presenting symptoms, physical exam findings and initial radiographic and laboratory data in the context of comorbid conditions.  The patient requires inpatient status due to high intensity of service, high risk of further deterioration and high frequency of surveillance required.  I certify that at the time of admission, it is my clinical judgment that the patient will require inpatient hospital care extending more than 2 midnights.                            Dispo: The patient is from: Home  Anticipated d/c is to: Home              Patient currently is not medically stable to d/c.              Difficult to place patient: No  Christel Mormon M.D on 05/03/2022 at 3:38 AM  [facility]   From 7 PM-7 AM, contact night-coverage [email or web address]  CC: Primary care physician; Glean Hess, MD

## 2022-05-02 NOTE — Discharge Instructions (Addendum)
Recommending you go to the emergency room for evaluation of community-acquired pneumonia, possible failed treatment with levofloxacin.

## 2022-05-02 NOTE — ED Triage Notes (Signed)
Pt. Presents to UC w/ c/o fatigue, sore throat, and a fever for the past 2 weeks. Pt. Was diagnosed w/ pneumonia 2 weeks ago as well.

## 2022-05-02 NOTE — ED Triage Notes (Signed)
Pt presents to the ED via POV due to flu-like symptoms. Pt was seen at [facility] today for similar symptoms and was told to come to ED. Pt was diagnosed with pneumonia  about 2 weeks and was given antibiotics and has not gotten better. Pt states at home COVID test was negative yesterday. Pt N/V/D. Pt A&Ox4

## 2022-05-02 NOTE — ED Provider Notes (Signed)
[facility] Provider Note    Event Date/Time   First MD Initiated Contact with Patient 05/02/22 1722     (approximate)   History   Cough   HPI  Paul Stokes is a 66 y.o. male here with cough.  The patient has a history of COPD.  He states he has a history of recurrent pneumonias.  Over the last several weeks, since the end of November, he has had cough and wheezing.  He was seen by his PCP initially and put on 5 days of steroids and Levaquin.  He was seen again and told to continue breathing treatments.  Over the last 24 hours, his cough is worsened and is now had nausea and 1 episode of emesis.  He states he has spiked fevers up to 102 at home in the last 24 hours.  This seems to be getting worse.  Denies any abdominal pain.  Of note, his wife was recently diagnosed with influenza.  He has not been using his breathing treatments over the last 2 days just because he is felt so unwell.  No other complaints.  EXTR.     Physical Exam   Triage Vital Signs: ED Triage Vitals  Enc Vitals Group     BP 05/02/22 1516 135/73     Pulse Rate 05/02/22 1516 90     Resp 05/02/22 1516 20     Temp 05/02/22 1516 97.9 F (36.6 C)     Temp Source 05/02/22 1516 Oral     SpO2 05/02/22 1516 98 %     Weight 05/02/22 1720 186 lb 8.2 oz (84.6 kg)     Height 05/02/22 1720 '5\' 7"'$  (1.702 m)     Head Circumference --      Peak Flow --      Pain Score 05/02/22 1520 0     Pain Loc --      Pain Edu? --      Excl. in [place]? --     Most recent vital signs: Vitals:   05/03/22 0115 05/03/22 0130  BP:    Pulse: 91 86  Resp: (!) 23 20  Temp:    SpO2: 95% 97%     General: Awake, no distress.  CV:  Good peripheral perfusion.  Regular rate and rhythm.  No murmurs. Resp:  Normal effort.  Tachypnea.  Diminished aeration.  No focal lung findings. Abd:  No distention.  No tenderness. Other:  No lower extremity edema.  No tenderness.   ED Results / Procedures / Treatments    Labs (all labs ordered are listed, but only abnormal results are displayed) Labs Reviewed  RESP PANEL BY RT-PCR (RSV, FLU A&B, COVID)  RVPGX2 - Abnormal; Notable for the following components:      Result Value   Influenza A by PCR POSITIVE (*)    All other components within normal limits  COMPREHENSIVE METABOLIC PANEL - Abnormal; Notable for the following components:   Glucose, Bld 165 (*)    AST 46 (*)    Total Bilirubin 1.3 (*)    All other components within normal limits  CBC WITH DIFFERENTIAL/PLATELET - Abnormal; Notable for the following components:   RDW 11.4 (*)    All other components within normal limits  CBG MONITORING, ED - Abnormal; Notable for the following components:   Glucose-Capillary 196 (*)    All other components within normal limits  CULTURE, BLOOD (SINGLE)  LACTIC ACID, PLASMA  LACTIC ACID, PLASMA  PROCALCITONIN  HIV ANTIBODY (ROUTINE TESTING W REFLEX)  BASIC METABOLIC PANEL  CBC  HEMOGLOBIN A1C  TROPONIN I (HIGH SENSITIVITY)     EKG Normal sinus rhythm, VR 99. PR 134, QRS 80, QTc 441. No acute St elevations. Non specific St depressions in inferior leads. No acute St elevaitons.    RADIOLOGY CXR: No active disease   I also independently reviewed and agree with radiologist interpretations.   PROCEDURES:  Critical Care performed: No  Procedures    MEDICATIONS ORDERED IN ED: Medications  aspirin EC tablet 81 mg (81 mg Oral Given 05/02/22 2336)  losartan (COZAAR) tablet 25 mg (25 mg Oral Given 05/02/22 2335)  pravastatin (PRAVACHOL) tablet 40 mg (40 mg Oral Given 05/02/22 2337)  buPROPion (WELLBUTRIN SR) 12 hr tablet 150 mg (has no administration in time range)  zolpidem (AMBIEN) tablet 5 mg (5 mg Oral Given 05/02/22 2339)  gabapentin (NEURONTIN) capsule 600 mg (600 mg Oral Given 05/02/22 2337)  cholecalciferol (VITAMIN D3) 25 MCG (1000 UNIT) tablet 5,000 Units (5,000 Units Oral Given 05/02/22 2338)  ipratropium-albuterol (DUONEB)  0.5-2.5 (3) MG/3ML nebulizer solution 3 mL (3 mLs Nebulization Given 05/02/22 2347)  methylPREDNISolone sodium succinate (SOLU-MEDROL) 40 mg/mL injection 40 mg (40 mg Intravenous Given 05/02/22 2347)    Followed by  predniSONE (DELTASONE) tablet 40 mg (has no administration in time range)  azithromycin (ZITHROMAX) 500 mg in sodium chloride 0.9 % 250 mL IVPB (0 mg Intravenous Stopped 05/03/22 0050)    Followed by  azithromycin (ZITHROMAX) tablet 500 mg (has no administration in time range)  enoxaparin (LOVENOX) injection 40 mg (40 mg Subcutaneous Given 05/02/22 2339)  0.9 %  sodium chloride infusion ( Intravenous New Bag/Given 05/02/22 2348)  acetaminophen (TYLENOL) tablet 650 mg (has no administration in time range)    Or  acetaminophen (TYLENOL) suppository 650 mg (has no administration in time range)  traZODone (DESYREL) tablet 25 mg (has no administration in time range)  magnesium hydroxide (MILK OF MAGNESIA) suspension 30 mL (has no administration in time range)  ondansetron (ZOFRAN) tablet 4 mg (has no administration in time range)    Or  ondansetron (ZOFRAN) injection 4 mg (has no administration in time range)  guaiFENesin (MUCINEX) 12 hr tablet 600 mg (600 mg Oral Given 05/02/22 2339)  chlorpheniramine-HYDROcodone (TUSSIONEX) 10-8 MG/5ML suspension 5 mL (has no administration in time range)  insulin aspart (novoLOG) injection 0-9 Units (has no administration in time range)  insulin aspart (novoLOG) injection 0-5 Units ( Subcutaneous Not Given 05/02/22 2348)  buPROPion (WELLBUTRIN SR) 12 hr tablet 300 mg (300 mg Oral Given 05/02/22 2340)  cyanocobalamin (VITAMIN B12) tablet 5,000 mcg (has no administration in time range)  sodium chloride 0.9 % bolus 1,000 mL (0 mLs Intravenous Stopped 05/02/22 2006)  ondansetron (ZOFRAN) injection 4 mg (4 mg Intravenous Given 05/02/22 1803)  methylPREDNISolone sodium succinate (SOLU-MEDROL) 125 mg/2 mL injection 125 mg (125 mg Intravenous Given  05/02/22 1803)  ipratropium-albuterol (DUONEB) 0.5-2.5 (3) MG/3ML nebulizer solution 3 mL (3 mLs Nebulization Given 05/02/22 1808)  ipratropium-albuterol (DUONEB) 0.5-2.5 (3) MG/3ML nebulizer solution 3 mL (3 mLs Nebulization Given 05/02/22 1807)  ipratropium-albuterol (DUONEB) 0.5-2.5 (3) MG/3ML nebulizer solution 3 mL (3 mLs Nebulization Given 05/02/22 1807)     IMPRESSION / MDM / ASSESSMENT AND PLAN / ED COURSE  I reviewed the triage vital signs and the nursing notes.  Differential diagnosis includes, but is not limited to, PNA, COPD exacerbation, influenza, PE.  Patient's presentation is most consistent with acute presentation with potential threat to life or bodily function.  66 yo F with PMHx COPD, DM, here with cough, SOB. CXR is clear. Labs show normal WBC, Hgb. CMP unremarkable. Influenza A positive. CXR is clear. However, pt is tachypneic, hypoxic on exam despite treatment. Will admit for IV steroids, abx, ongoing management.   FINAL CLINICAL IMPRESSION(S) / ED DIAGNOSES   Final diagnoses:  COPD exacerbation ([place])     Rx / DC Orders   ED Discharge Orders     None        Note:  This document was F prepared using Dragon voice recognition software and may include unintentional dictation errors.   Duffy Bruce, MD 05/03/22 [phone]

## 2022-05-03 DIAGNOSIS — J09X1 Influenza due to identified novel influenza A virus with pneumonia: Secondary | ICD-10-CM | POA: Diagnosis not present

## 2022-05-03 DIAGNOSIS — J9601 Acute respiratory failure with hypoxia: Secondary | ICD-10-CM | POA: Diagnosis not present

## 2022-05-03 DIAGNOSIS — F32A Depression, unspecified: Secondary | ICD-10-CM | POA: Insufficient documentation

## 2022-05-03 DIAGNOSIS — E785 Hyperlipidemia, unspecified: Secondary | ICD-10-CM | POA: Insufficient documentation

## 2022-05-03 DIAGNOSIS — F324 Major depressive disorder, single episode, in partial remission: Secondary | ICD-10-CM | POA: Insufficient documentation

## 2022-05-03 DIAGNOSIS — J441 Chronic obstructive pulmonary disease with (acute) exacerbation: Secondary | ICD-10-CM | POA: Diagnosis not present

## 2022-05-03 DIAGNOSIS — I10 Essential (primary) hypertension: Secondary | ICD-10-CM | POA: Insufficient documentation

## 2022-05-03 LAB — BASIC METABOLIC PANEL
Anion gap: 8 (ref 5–15)
BUN: 18 mg/dL (ref 8–23)
CO2: 23 mmol/L (ref 22–32)
Calcium: 8.1 mg/dL — ABNORMAL LOW (ref 8.9–10.3)
Chloride: 103 mmol/L (ref 98–111)
Creatinine, Ser: 1.05 mg/dL (ref 0.61–1.24)
GFR, Estimated: >60 mL/min (ref >60)
Glucose, Bld: 344 mg/dL — ABNORMAL HIGH (ref 70–99)
Potassium: 4.1 mmol/L (ref 3.5–5.1)
Sodium: 134 mmol/L — ABNORMAL LOW (ref 135–145)

## 2022-05-03 LAB — CBC
HCT: 41.1 % (ref 39.0–52.0)
Hemoglobin: 13.2 g/dL (ref 13.0–17.0)
MCH: 29.1 pg (ref 26.0–34.0)
MCHC: 32.1 g/dL (ref 30.0–36.0)
MCV: 90.7 fL (ref 80.0–100.0)
Platelets: 286 10*3/uL (ref 150–400)
RBC: 4.53 MIL/uL (ref 4.22–5.81)
RDW: 11.3 % — ABNORMAL LOW (ref 11.5–15.5)
WBC: 5.6 10*3/uL (ref 4.0–10.5)
nRBC: 0 % (ref 0.0–0.2)

## 2022-05-03 LAB — CBG MONITORING, ED
Glucose-Capillary: 320 mg/dL — ABNORMAL HIGH (ref 70–99)
Glucose-Capillary: 454 mg/dL — ABNORMAL HIGH (ref 70–99)
Glucose-Capillary: 336 mg/dL — ABNORMAL HIGH (ref 70–99)

## 2022-05-03 LAB — HIV ANTIBODY (ROUTINE TESTING W REFLEX): HIV Screen 4th Generation wRfx: NONREACTIVE

## 2022-05-03 MED ORDER — INSULIN ASPART 100 UNIT/ML IJ SOLN
10.0000 [IU] | Freq: Once | INTRAMUSCULAR | Status: AC
  Administered 2022-05-03: 10 [IU] via SUBCUTANEOUS
  Filled 2022-05-03: qty 1

## 2022-05-03 MED ORDER — OSELTAMIVIR PHOSPHATE 75 MG PO CAPS
75.0000 mg | ORAL_CAPSULE | Freq: Two times a day (BID) | ORAL | Status: DC
  Administered 2022-05-03: 75 mg via ORAL
  Filled 2022-05-03: qty 1

## 2022-05-03 MED ORDER — AZITHROMYCIN 500 MG PO TABS: 500.0000 mg | ORAL_TABLET | Freq: Every day | ORAL | 0 refills | Status: AC

## 2022-05-03 MED ORDER — OSELTAMIVIR PHOSPHATE 75 MG PO CAPS: 75.0000 mg | ORAL_CAPSULE | Freq: Two times a day (BID) | ORAL | 0 refills | Status: AC

## 2022-05-03 MED ORDER — INSULIN ASPART 100 UNIT/ML IJ SOLN
8.0000 [IU] | Freq: Once | INTRAMUSCULAR | Status: AC
  Administered 2022-05-03: 8 [IU] via SUBCUTANEOUS
  Filled 2022-05-03: qty 1

## 2022-05-03 NOTE — Care Management CC44 (Signed)
Condition Code 44 Documentation Completed  Patient Details  Name: CIARAN BEGAY MRN: [id number] Date of Birth: September 07, 1955   Condition Code 44 given:  Yes Patient signature on Condition Code 44 notice:  Yes Documentation of 2 MD's agreement:  Yes Code 44 added to claim:  Yes    Shelbie Hutching, RN 05/03/2022, 4:02 PM

## 2022-05-03 NOTE — Care Management Obs Status (Signed)
[facility] NOTIFICATION   Patient Details  Name: Paul Stokes MRN: [id number] Date of Birth: February 13, 1956   Medicare Observation Status Notification Given:  Yes    Shelbie Hutching, RN 05/03/2022, 4:02 PM

## 2022-05-03 NOTE — Assessment & Plan Note (Addendum)
-   due to viral PNA - O2 weaned off prior to discharge and ambulated with no desats

## 2022-05-03 NOTE — ED Notes (Signed)
Pt did ambulate with monitor without O2 sats dropped to 91%.

## 2022-05-03 NOTE — Assessment & Plan Note (Signed)
-   We will continue statin therapy. 

## 2022-05-03 NOTE — Assessment & Plan Note (Signed)
-   We will continue his Wellbutrin SR.

## 2022-05-03 NOTE — Assessment & Plan Note (Signed)
-   We will continue his hypertensives.

## 2022-05-03 NOTE — Discharge Summary (Signed)
Physician Discharge Summary   Paul Stokes DTO:[id number] DOB: 02/26/1956 DOA: 05/02/2022  PCP: Glean Hess, MD  Admit date: 05/02/2022 Discharge date: 05/03/2022 Barriers to discharge: none  Admitted From: Home Disposition:  Home Discharging physician: Dwyane Dee, MD  Recommendations for Outpatient Follow-up:  Continue routine care  Home Health:  Equipment/Devices:   Discharge Condition: stable CODE STATUS: Full Diet recommendation:  Diet Orders (From admission, onward)     Start     Ordered   05/03/22 0000  Diet - low sodium heart healthy        05/03/22 1502   05/03/22 0000  Diet Carb Modified        05/03/22 1502   05/02/22 2215  Diet heart healthy/carb modified Room service appropriate? Yes; Fluid consistency: Thin  Diet effective now       Question Answer Comment  Diet-HS Snack? Nothing   Room service appropriate? Yes   Fluid consistency: Thin      05/02/22 2215            Hospital Course: No notes on file  Assessment and Plan: * COPD exacerbation ([place]) - This like secondary to his influenza A. - continue tamiflu and azithro at discharge  - no need for steroids; no further wheezing and CBGs very sensitive to steroid use  Acute respiratory failure with hypoxia (HCC) - due to viral PNA - O2 weaned off prior to discharge and ambulated with no desats  Depression - We will continue his Wellbutrin SR.  Dyslipidemia - We will continue statin therapy.  Essential hypertension - We will continue his hypertensives.  Type 1 diabetes mellitus with sensory neuropathy (HCC) - Continued on insulin pump       The patient's chronic medical conditions were treated accordingly per the patient's home medication regimen except as noted.  On day of discharge, patient was felt deemed stable for discharge. Patient/family member advised to call PCP or come back to ER if needed.   Principal Diagnosis: COPD exacerbation [facility])  Discharge  Diagnoses: Active Hospital Problems   Diagnosis Date Noted   COPD exacerbation ([place]) 05/02/2022    Priority: 1.   Acute respiratory failure with hypoxia ([place]) 05/03/2022    Priority: 2.   Essential hypertension 05/03/2022   Dyslipidemia 05/03/2022   Depression 05/03/2022   Influenza A with pneumonia 05/03/2022   Type 1 diabetes mellitus with sensory neuropathy ([place]) 03/06/2015    Resolved Hospital Problems  No resolved problems to display.     Discharge Instructions     Diet - low sodium heart healthy   Complete by: As directed    Diet Carb Modified   Complete by: As directed    Increase activity slowly   Complete by: As directed       Allergies as of 05/03/2022       Reactions   Lisinopril Cough        Medication List     TAKE these medications    Advair HFA 45-21 MCG/ACT inhaler Generic drug: fluticasone-salmeterol Inhale 2 puffs into the lungs 2 (two) times daily.   AeroChamber MV inhaler Use as instructed   albuterol 108 (90 Base) MCG/ACT inhaler Commonly known as: VENTOLIN HFA Inhale 2 puffs into the lungs every 4 (four) hours as needed for wheezing or shortness of breath.   albuterol (2.5 MG/3ML) 0.083% nebulizer solution Commonly known as: PROVENTIL Take 3 mLs (2.5 mg total) by nebulization every 4 (four) hours as needed for wheezing or shortness of breath.  aspirin EC 81 MG tablet Take 81 mg by mouth daily. Swallow whole.   azithromycin 500 MG tablet Commonly known as: ZITHROMAX Take 1 tablet (500 mg total) by mouth daily for 4 days.   Bayer Contour Next Test test strip Generic drug: glucose blood   buPROPion 150 MG 12 hr tablet Commonly known as: WELLBUTRIN SR TAKE 1 TABLET BY MOUTH EVERY MORNING AND2 TABLETS AT BEDTIME   Cyanocobalamin 5000 MCG/ML Liqd Place under the tongue daily. Liquid   Dexcom G6 Sensor Misc by Does not apply route.   gabapentin 300 MG capsule Commonly known as: NEURONTIN TAKE 2 CAPULES BY MOUTH THREE TIMES  DAILY   HumaLOG 100 UNIT/ML injection Generic drug: insulin lispro Inject 80 Units into the skin See admin instructions. 80 units per day through insulin pump   losartan 25 MG tablet Commonly known as: COZAAR TAKE 1 TABLET BY MOUTH ONCE DAILY   oseltamivir 75 MG capsule Commonly known as: TAMIFLU Take 1 capsule (75 mg total) by mouth 2 (two) times daily for 5 days.   pravastatin 40 MG tablet Commonly known as: PRAVACHOL TAKE 1 TABLET BY MOUTH DAILY   tadalafil 5 MG tablet Commonly known as: CIALIS Take 1 tablet (5 mg total) by mouth daily as needed for erectile dysfunction.   VITAMIN D PO Take 5,000 Units by mouth daily.   zolpidem 10 MG tablet Commonly known as: AMBIEN TAKE 1 TABLET BY MOUTH AT BEDTIME.        Allergies  Allergen Reactions   Lisinopril Cough    Consultations:   Procedures:   Discharge Exam: BP (!) 108/57   Pulse 75   Temp 98.3 F (36.8 C) (Oral)   Resp 18   Ht '5\' 7"'$  (1.702 m)   Wt 84.6 kg   SpO2 93%   BMI 29.21 kg/m  Physical Exam Constitutional:      General: He is not in acute distress.    Appearance: Normal appearance.  HENT:     Head: Normocephalic and atraumatic.     Mouth/Throat:     Mouth: Mucous membranes are moist.  Eyes:     Extraocular Movements: Extraocular movements intact.  Cardiovascular:     Rate and Rhythm: Normal rate and regular rhythm.     Heart sounds: Normal heart sounds.  Pulmonary:     Effort: Pulmonary effort is normal. No respiratory distress.     Breath sounds: Rhonchi present. No wheezing.  Abdominal:     General: Bowel sounds are normal. There is no distension.     Palpations: Abdomen is soft.     Tenderness: There is no abdominal tenderness.  Musculoskeletal:        General: Normal range of motion.     Cervical back: Normal range of motion and neck supple.  Skin:    General: Skin is warm and dry.  Neurological:     General: No focal deficit present.     Mental Status: He is alert.   Psychiatric:        Mood and Affect: Mood normal.        Behavior: Behavior normal.      The results of significant diagnostics from this hospitalization (including imaging, microbiology, ancillary and laboratory) are listed below for reference.   Microbiology: Recent Results (from the past 240 hour(s))  Resp panel by RT-PCR (RSV, Flu A&B, Covid) Anterior Nasal Swab     Status: Abnormal   Collection Time: 05/02/22  5:48 PM   Specimen: Anterior Nasal  Swab  Result Value Ref Range Status   SARS Coronavirus 2 by RT PCR NEGATIVE NEGATIVE Final    Comment: (NOTE) SARS-CoV-2 target nucleic acids are NOT DETECTED.  The SARS-CoV-2 RNA is generally detectable in upper respiratory specimens during the acute phase of infection. The lowest concentration of SARS-CoV-2 viral copies this assay can detect is 138 copies/mL. A negative result does not preclude SARS-Cov-2 infection and should not be used as the sole basis for treatment or other patient management decisions. A negative result may occur with  improper specimen collection/handling, submission of specimen other than nasopharyngeal swab, presence of viral mutation(s) within the areas targeted by this assay, and inadequate number of viral copies(<138 copies/mL). A negative result must be combined with clinical observations, patient history, and epidemiological information. The expected result is Negative.  Fact Sheet for Patients:  EntrepreneurPulse.com.au  Fact Sheet for Healthcare Providers:  IncredibleEmployment.be  This test is no t yet approved or cleared by the [place] FDA and  has been authorized for detection and/or diagnosis of SARS-CoV-2 by FDA under an Emergency Use Authorization (EUA). This EUA will remain  in effect (meaning this test can be used) for the duration of the COVID-19 declaration under Section 564(b)(1) of the Act, 21 U.S.C.section 360bbb-3(b)(1), unless the  authorization is terminated  or revoked sooner.       Influenza A by PCR POSITIVE (A) NEGATIVE Final   Influenza B by PCR NEGATIVE NEGATIVE Final    Comment: (NOTE) The Xpert Xpress SARS-CoV-2/FLU/RSV plus assay is intended as an aid in the diagnosis of influenza from Nasopharyngeal swab specimens and should not be used as a sole basis for treatment. Nasal washings and aspirates are unacceptable for Xpert Xpress SARS-CoV-2/FLU/RSV testing.  Fact Sheet for Patients: EntrepreneurPulse.com.au  Fact Sheet for Healthcare Providers: IncredibleEmployment.be  This test is not yet approved or cleared by the [place] FDA and has been authorized for detection and/or diagnosis of SARS-CoV-2 by FDA under an Emergency Use Authorization (EUA). This EUA will remain in effect (meaning this test can be used) for the duration of the COVID-19 declaration under Section 564(b)(1) of the Act, 21 U.S.C. section 360bbb-3(b)(1), unless the authorization is terminated or revoked.     Resp Syncytial Virus by PCR NEGATIVE NEGATIVE Final    Comment: (NOTE) Fact Sheet for Patients: EntrepreneurPulse.com.au  Fact Sheet for Healthcare Providers: IncredibleEmployment.be  This test is not yet approved or cleared by the [place] FDA and has been authorized for detection and/or diagnosis of SARS-CoV-2 by FDA under an Emergency Use Authorization (EUA). This EUA will remain in effect (meaning this test can be used) for the duration of the COVID-19 declaration under Section 564(b)(1) of the Act, 21 U.S.C. section 360bbb-3(b)(1), unless the authorization is terminated or revoked.  Performed at [facility], [address] [place], [place] [address]   Blood culture (single)     Status: None (Preliminary result)   Collection Time: 05/02/22  5:48 PM   Specimen: Left Antecubital; Blood  Result Value Ref Range Status    Specimen Description LEFT ANTECUBITAL  Final   Special Requests   Final    BOTTLES DRAWN AEROBIC AND ANAEROBIC Blood Culture results may not be optimal due to an excessive volume of blood received in culture bottles   Culture   Final    NO GROWTH < 24 HOURS Performed at [facility], [address], [place], [place] [address]    Report Status PENDING  Incomplete  Labs: BNP (last 3 results) No results for input(s): "BNP" in the last 8760 hours. Basic Metabolic Panel: Recent Labs  Lab 05/02/22 1748 05/03/22 0452  NA 135 134*  K 4.1 4.1  CL 99 103  CO2 26 23  GLUCOSE 165* 344*  BUN 20 18  CREATININE 1.06 1.05  CALCIUM 8.9 8.1*   Liver Function Tests: Recent Labs  Lab 05/02/22 1748  AST 46*  ALT 27  ALKPHOS 71  BILITOT 1.3*  PROT 6.9  ALBUMIN 3.7   No results for input(s): "LIPASE", "AMYLASE" in the last 168 hours. No results for input(s): "AMMONIA" in the last 168 hours. CBC: Recent Labs  Lab 05/02/22 1748 05/03/22 0452  WBC 6.1 5.6  NEUTROABS 3.8  --   HGB 14.4 13.2  HCT 43.2 41.1  MCV 89.8 90.7  PLT 272 286   Cardiac Enzymes: No results for input(s): "CKTOTAL", "CKMB", "CKMBINDEX", "TROPONINI" in the last 168 hours. BNP: Invalid input(s): "POCBNP" CBG: Recent Labs  Lab 05/02/22 2301 05/03/22 0731 05/03/22 1219 05/03/22 1459  GLUCAP 196* 320* 454* 336*   D-Dimer No results for input(s): "DDIMER" in the last 72 hours. Hgb A1c No results for input(s): "HGBA1C" in the last 72 hours. Lipid Profile No results for input(s): "CHOL", "HDL", "LDLCALC", "TRIG", "CHOLHDL", "LDLDIRECT" in the last 72 hours. Thyroid function studies No results for input(s): "TSH", "T4TOTAL", "T3FREE", "THYROIDAB" in the last 72 hours.  Invalid input(s): "FREET3" Anemia work up No results for input(s): "VITAMINB12", "FOLATE", "FERRITIN", "TIBC", "IRON", "RETICCTPCT" in the last 72 hours. Urinalysis    Component Value Date/Time   COLORURINE YELLOW 01/12/2021  1000   APPEARANCEUR Clear 03/17/2021 0848   LABSPEC 1.020 01/12/2021 1000   PHURINE 6.0 01/12/2021 1000   GLUCOSEU 2+ (A) 03/17/2021 0848   HGBUR NEGATIVE 01/12/2021 1000   BILIRUBINUR Negative 03/17/2021 0848   KETONESUR NEGATIVE 01/12/2021 1000   PROTEINUR Negative 03/17/2021 0848   PROTEINUR NEGATIVE 01/12/2021 1000   UROBILINOGEN 0.2 01/05/2021 1131   NITRITE Negative 03/17/2021 0848   NITRITE NEGATIVE 01/12/2021 1000   LEUKOCYTESUR Negative 03/17/2021 0848   LEUKOCYTESUR NEGATIVE 01/12/2021 1000   Sepsis Labs Recent Labs  Lab 05/02/22 1748 05/03/22 0452  WBC 6.1 5.6   Microbiology Recent Results (from the past 240 hour(s))  Resp panel by RT-PCR (RSV, Flu A&B, Covid) Anterior Nasal Swab     Status: Abnormal   Collection Time: 05/02/22  5:48 PM   Specimen: Anterior Nasal Swab  Result Value Ref Range Status   SARS Coronavirus 2 by RT PCR NEGATIVE NEGATIVE Final    Comment: (NOTE) SARS-CoV-2 target nucleic acids are NOT DETECTED.  The SARS-CoV-2 RNA is generally detectable in upper respiratory specimens during the acute phase of infection. The lowest concentration of SARS-CoV-2 viral copies this assay can detect is 138 copies/mL. A negative result does not preclude SARS-Cov-2 infection and should not be used as the sole basis for treatment or other patient management decisions. A negative result may occur with  improper specimen collection/handling, submission of specimen other than nasopharyngeal swab, presence of viral mutation(s) within the areas targeted by this assay, and inadequate number of viral copies(<138 copies/mL). A negative result must be combined with clinical observations, patient history, and epidemiological information. The expected result is Negative.  Fact Sheet for Patients:  EntrepreneurPulse.com.au  Fact Sheet for Healthcare Providers:  IncredibleEmployment.be  This test is no t yet approved or cleared by  the [place] FDA and  has been authorized for detection and/or diagnosis  of SARS-CoV-2 by FDA under an Emergency Use Authorization (EUA). This EUA will remain  in effect (meaning this test can be used) for the duration of the COVID-19 declaration under Section 564(b)(1) of the Act, 21 U.S.C.section 360bbb-3(b)(1), unless the authorization is terminated  or revoked sooner.       Influenza A by PCR POSITIVE (A) NEGATIVE Final   Influenza B by PCR NEGATIVE NEGATIVE Final    Comment: (NOTE) The Xpert Xpress SARS-CoV-2/FLU/RSV plus assay is intended as an aid in the diagnosis of influenza from Nasopharyngeal swab specimens and should not be used as a sole basis for treatment. Nasal washings and aspirates are unacceptable for Xpert Xpress SARS-CoV-2/FLU/RSV testing.  Fact Sheet for Patients: EntrepreneurPulse.com.au  Fact Sheet for Healthcare Providers: IncredibleEmployment.be  This test is not yet approved or cleared by the [place] FDA and has been authorized for detection and/or diagnosis of SARS-CoV-2 by FDA under an Emergency Use Authorization (EUA). This EUA will remain in effect (meaning this test can be used) for the duration of the COVID-19 declaration under Section 564(b)(1) of the Act, 21 U.S.C. section 360bbb-3(b)(1), unless the authorization is terminated or revoked.     Resp Syncytial Virus by PCR NEGATIVE NEGATIVE Final    Comment: (NOTE) Fact Sheet for Patients: EntrepreneurPulse.com.au  Fact Sheet for Healthcare Providers: IncredibleEmployment.be  This test is not yet approved or cleared by the [place] FDA and has been authorized for detection and/or diagnosis of SARS-CoV-2 by FDA under an Emergency Use Authorization (EUA). This EUA will remain in effect (meaning this test can be used) for the duration of the COVID-19 declaration under Section 564(b)(1) of the Act, 21  U.S.C. section 360bbb-3(b)(1), unless the authorization is terminated or revoked.  Performed at [facility], [address] [place], [place] [address]   Blood culture (single)     Status: None (Preliminary result)   Collection Time: 05/02/22  5:48 PM   Specimen: Left Antecubital; Blood  Result Value Ref Range Status   Specimen Description LEFT ANTECUBITAL  Final   Special Requests   Final    BOTTLES DRAWN AEROBIC AND ANAEROBIC Blood Culture results may not be optimal due to an excessive volume of blood received in culture bottles   Culture   Final    NO GROWTH < 24 HOURS Performed at [facility], [address], [place], [place] [address]    Report Status PENDING  Incomplete    Procedures/Studies: DG Chest 2 View  Result Date: 05/02/2022 CLINICAL DATA:  Cough for 2 weeks. EXAM: CHEST - 2 VIEW COMPARISON:  None Available. FINDINGS: The heart size and mediastinal contours are within normal limits. Both lungs are clear. No acute osseous abnormality. IMPRESSION: No active cardiopulmonary disease. Electronically Signed   By: Keane Police D.O.   On: 05/02/2022 17:16   DG Chest 2 View  Result Date: 04/12/2022 CLINICAL DATA:  whe[place] RLL h/o COPD r/o PNA EXAM: CHEST - 2 VIEW COMPARISON:  Radiograph 06/22/2021 FINDINGS: Unchanged cardiomediastinal silhouette. There is faint right middle lobe airspace disease. No pleural effusion or evidence of pneumothorax. There is no acute osseous abnormality. Unchanged sclerosis in the left proximal humerus, likely benign. IMPRESSION: Right middle lobe pneumonia. Recommend follow-up PA and lateral chest radiograph in 6-12 weeks to ensure resolution. Electronically Signed   By: Maurine Simmering M.D.   On: 04/12/2022 12:47     Time coordinating discharge: Over 30 minutes    Dwyane Dee, MD  [facility] 05/03/2022, 4:41 PM

## 2022-05-03 NOTE — TOC Initial Note (Signed)
Transition of Care [facility]) - Initial/Assessment Note    Patient Details  Name: Paul Stokes MRN: [id number] Date of Birth: Sep 23, 1955  [facility]) CM/SW Contact:    Shelbie Hutching, RN Phone Number: 05/03/2022, 4:40 PM  Clinical Narrative:                 Patient is from home with his wife.  Wife is at home with the flu.  He has a friend that will be coming to pick him up today. No TOC needs identified.         Patient Goals and CMS Choice        Expected Discharge Plan and Services           Expected Discharge Date: 05/03/22                                    Prior Living Arrangements/Services                       Activities of Daily Living      Permission Sought/Granted                  Emotional Assessment              Admission diagnosis:  COPD exacerbation [facility]) [J44.1] Patient Active Problem List   Diagnosis Date Noted   Acute respiratory failure with hypoxia ([place]) 05/03/2022   Essential hypertension 05/03/2022   Dyslipidemia 05/03/2022   Depression 05/03/2022   Influenza A with pneumonia 05/03/2022   COPD exacerbation ([place]) 05/02/2022   Benign essential tremor 07/26/2021   History of gross hematuria 01/05/2021   Centrilobular emphysema ([place]) 12/11/2019   Personal history of colonic polyps    Aortic atherosclerosis ([place]) 08/27/2019   Dyspepsia 08/06/2018   Acute bilateral low back pain with right-sided sciatica 11/29/2017   Mood disorder ([place]) 11/15/2016   BPH with obstruction/lower urinary tract symptoms 06/15/2015   Hyperlipidemia due to type 1 diabetes mellitus ([place]) 06/15/2015   Insomnia, persistent 03/06/2015   Compulsive tobacco user syndrome 03/06/2015   Type 1 diabetes mellitus with sensory neuropathy ([place]) 03/06/2015   Presence of insulin pump 06/08/2014   PCP:  Glean Hess, MD Pharmacy:   [place], [place] [address] [place]  [address] Phone: [phone] Fax: [phone]  [facility] [place], [place] [address] Phone: [phone] Fax: [place], [place] [address] [place] [address] [place] [address] Phone: [phone] Fax: [phone]     Social Determinants of Health (SDOH) Interventions    Readmission Risk Interventions     No data to display

## 2022-05-03 NOTE — Assessment & Plan Note (Addendum)
-   Continued on insulin pump

## 2022-05-03 NOTE — Inpatient Diabetes Management (Addendum)
Inpatient Diabetes Program Recommendations  AACE/ADA: New Consensus Statement on Inpatient Glycemic Control (2015)  Target Ranges:  Prepandial:   less than 140 mg/dL      Peak postprandial:   less than 180 mg/dL (1-2 hours)      Critically ill patients:  140 - 180 mg/dL   Lab Results  Component Value Date   GLUCAP 320 (H) 05/03/2022   HGBA1C 8.1 (H) 05/26/2021    Review of Glycemic Control  Diabetes history: DM1  Outpatient Diabetes medications: Tandem X2 insulin pump  Current orders for Inpatient glycemic control: Insulin pump  Inpatient Diabetes Program Recommendations:   Patient currently in ED with cough. Received Solumedrol @ 9741 pm last hs so CBGs have increased post administration. Patient sees Dr. Gabriel Carina for endocrinology with last visit 03/14/22.  Patient wears a Tandem X2 insulin pump. -Basal rates  12 AM 1.25 units/hr Correction 40 Carb ratio 3.8 Target 110  Dr. Gabriel Carina noted on this visit that patient does not always bolus for meals as prescribed. A1c 8.1 on 03/14/22  If patient admitted, Will need insulin pump order set  12:15 Spoke with patient regarding insulin pump. Patient shared that his Dexcom 6 was expired and insulin pump does not adjust based on glucose readings. Patient is not used to managing insulin pump in manual mode. Patient has his transmitted with him and has sensors @ home and all other supplies. Patient received Novolog 8 units for elevated CBG and patient aware to check CBG as soon as he arrives home. Patient is being discharged home now. RN to report CBG  to MD and will follow as needed.  Thank you, Nani Gasser. Jodi Criscuolo, RN, MSN, CDE  Diabetes Coordinator Inpatient Glycemic Control Team Team Pager [phone] (8am-5pm) 05/03/2022 12:09 PM

## 2022-05-03 NOTE — Assessment & Plan Note (Addendum)
-   This like secondary to his influenza A. - continue tamiflu and azithro at discharge  - no need for steroids; no further wheezing and CBGs very sensitive to steroid use

## 2022-05-04 ENCOUNTER — Telehealth: Payer: Self-pay

## 2022-05-04 LAB — HEMOGLOBIN A1C
Hgb A1c MFr Bld: 8.6 % — ABNORMAL HIGH (ref 4.8–5.6)
Mean Plasma Glucose: 200 mg/dL

## 2022-05-04 NOTE — Telephone Encounter (Signed)
Transition Care Management Unsuccessful Follow-up Telephone Call  Date of discharge and from where:  05/03/2022 [facility]  Attempts:  1st Attempt  Reason for unsuccessful TCM follow-up call:  Left voice message

## 2022-05-04 NOTE — Telephone Encounter (Signed)
Transition Care Management Unsuccessful Follow-up Telephoone  Discharge from where:  [facility]  Attempts:  3rd Attempt  Reason for unsuccessful TCM follow-up call:  Left voice message

## 2022-05-04 NOTE — Telephone Encounter (Signed)
Transition Care Management Unsuccessful Follow-up Telephone Call  Date of discharge and from where:  [facility] 05/03/2022  Attempts:  2nd Attempt  Reason for unsuccessful TCM follow-up call:  Left voice message

## 2022-05-07 ENCOUNTER — Encounter: Payer: Self-pay | Admitting: Internal Medicine

## 2022-05-07 LAB — CULTURE, BLOOD (SINGLE): Culture: NO GROWTH

## 2022-05-08 ENCOUNTER — Other Ambulatory Visit: Payer: Self-pay | Admitting: Internal Medicine

## 2022-05-08 DIAGNOSIS — J432 Centrilobular emphysema: Secondary | ICD-10-CM

## 2022-05-08 MED ORDER — PREDNISONE 10 MG PO TABS: ORAL_TABLET | ORAL | 0 refills | Status: AC

## 2022-05-17 ENCOUNTER — Other Ambulatory Visit: Payer: Self-pay | Admitting: Internal Medicine

## 2022-05-18 ENCOUNTER — Other Ambulatory Visit: Payer: Self-pay | Admitting: Internal Medicine

## 2022-05-18 DIAGNOSIS — E1165 Type 2 diabetes mellitus with hyperglycemia: Secondary | ICD-10-CM | POA: Diagnosis not present

## 2022-05-20 ENCOUNTER — Ambulatory Visit: Payer: Self-pay

## 2022-05-20 ENCOUNTER — Telehealth (INDEPENDENT_AMBULATORY_CARE_PROVIDER_SITE_OTHER): Payer: Medicare Other | Admitting: Internal Medicine

## 2022-05-20 ENCOUNTER — Other Ambulatory Visit: Payer: Self-pay

## 2022-05-20 ENCOUNTER — Encounter: Payer: Self-pay | Admitting: Internal Medicine

## 2022-05-20 VITALS — Ht 67.0 in

## 2022-05-20 DIAGNOSIS — U071 COVID-19: Secondary | ICD-10-CM

## 2022-05-20 DIAGNOSIS — J449 Chronic obstructive pulmonary disease, unspecified: Secondary | ICD-10-CM | POA: Diagnosis not present

## 2022-05-20 MED ORDER — MOLNUPIRAVIR EUA 200MG CAPSULE: 4.0000 | ORAL_CAPSULE | Freq: Two times a day (BID) | ORAL | 0 refills | Status: AC

## 2022-05-20 MED ORDER — ALBUTEROL SULFATE HFA 108 (90 BASE) MCG/ACT IN AERS: 2.0000 | INHALATION_SPRAY | RESPIRATORY_TRACT | 5 refills | Status: AC | PRN

## 2022-05-20 MED ORDER — BREZTRI AEROSPHERE 160-9-4.8 MCG/ACT IN AERO: 2.0000 | INHALATION_SPRAY | Freq: Two times a day (BID) | RESPIRATORY_TRACT | 11 refills | Status: DC

## 2022-05-20 MED ORDER — PREDNISONE 10 MG PO TABS: ORAL_TABLET | ORAL | 0 refills | Status: AC

## 2022-05-20 NOTE — Progress Notes (Signed)
Date:  05/20/2022   Name:  Paul Stokes   DOB:  04/22/1956   MRN:  [id number]  This encounter was conducted via video encounter due to the need for social distancing in light of the Covid-19 pandemic.  The patient was correctly identified.  I advised that I am conducting the visit from a secure room in my office at [facility] clinic.  The patient is located at home. The limitations of this form of encounter were discussed with the patient and he/she agreed to proceed.  Some vital signs will be absent.  Chief Complaint: Covid Positive (Today with home test )  Treated by UC 12/18 for Influenza A.  Has had 2 rounds of oral steroids as the only thing that has brought relief.  Last taper completed 05/14/22.  Now worsening again and tested positive for Covid 19 this am.  Cough This is a chronic problem. The problem has been rapidly worsening. The problem occurs every few minutes. The cough is Non-productive. Associated symptoms include shortness of breath. Pertinent negatives include no chest pain, fever, headaches or wheezing. He has tried oral steroids, a beta-agonist inhaler and steroid inhaler for the symptoms. The treatment provided mild relief. His past medical history is significant for COPD.    Lab Results  Component Value Date   NA 134 (L) 05/03/2022   K 4.1 05/03/2022   CO2 23 05/03/2022   GLUCOSE 344 (H) 05/03/2022   BUN 18 05/03/2022   CREATININE 1.05 05/03/2022   CALCIUM 8.1 (L) 05/03/2022   EGFR 72 05/26/2021   GFRNONAA >60 05/03/2022   Lab Results  Component Value Date   CHOL 159 05/26/2021   HDL 58 05/26/2021   LDLCALC 88 05/26/2021   TRIG 69 05/26/2021   CHOLHDL 2.7 05/26/2021   Lab Results  Component Value Date   TSH 1.510 08/06/2020   Lab Results  Component Value Date   HGBA1C 8.6 (H) 05/02/2022   Lab Results  Component Value Date   WBC 5.6 05/03/2022   HGB 13.2 05/03/2022   HCT 41.1 05/03/2022   MCV 90.7 05/03/2022   PLT 286 05/03/2022   Lab  Results  Component Value Date   ALT 27 05/02/2022   AST 46 (H) 05/02/2022   ALKPHOS 71 05/02/2022   BILITOT 1.3 (H) 05/02/2022   No results found for: "25OHVITD2", "25OHVITD3", "VD25OH"   Review of Systems  Constitutional:  Positive for fatigue. Negative for diaphoresis and fever.  HENT:  Negative for sinus pressure.   Respiratory:  Positive for cough, chest tightness and shortness of breath. Negative for wheezing.   Cardiovascular:  Negative for chest pain and palpitations.  Gastrointestinal:  Negative for diarrhea, nausea and vomiting.  Neurological:  Negative for dizziness and headaches.  Psychiatric/Behavioral:  Negative for sleep disturbance.     Patient Active Problem List   Diagnosis Date Noted   Essential hypertension 05/03/2022   Dyslipidemia 05/03/2022   Depression 05/03/2022   Influenza A with pneumonia 05/03/2022   Chronic obstructive pulmonary disease ([place]) 05/02/2022   Benign essential tremor 07/26/2021   History of gross hematuria 01/05/2021   Personal history of colonic polyps    Aortic atherosclerosis ([place]) 08/27/2019   Dyspepsia 08/06/2018   Acute bilateral low back pain with right-sided sciatica 11/29/2017   Mood disorder ([place]) 11/15/2016   BPH with obstruction/lower urinary tract symptoms 06/15/2015   Hyperlipidemia due to type 1 diabetes mellitus ([place]) 06/15/2015   Insomnia, persistent 03/06/2015   Compulsive tobacco user syndrome  03/06/2015   Type 1 diabetes mellitus with sensory neuropathy (HCC) 03/06/2015   Presence of insulin pump 06/08/2014    Allergies  Allergen Reactions   Lisinopril Cough    Past Surgical History:  Procedure Laterality Date   CATARACT EXTRACTION, BILATERAL     COLONOSCOPY  08/14/2013   7 tubular adenomas   COLONOSCOPY WITH PROPOFOL N/A 09/10/2019   Procedure: COLONOSCOPY WITH PROPOFOL;  Surgeon: [name], MD;  Location: [facility] ENDOSCOPY;  Service: Endoscopy;  Laterality: N/A;   COLONOSCOPY WITH PROPOFOL N/A  09/11/2019   Procedure: COLONOSCOPY WITH PROPOFOL;  Surgeon: Jonathon Bellows, MD;  Location: [facility] ENDOSCOPY;  Service: Gastroenterology;  Laterality: N/A;   HOLEP-LASER ENUCLEATION OF THE PROSTATE WITH MORCELLATION N/A 04/02/2021   Procedure: HOLEP-LASER ENUCLEATION OF THE PROSTATE WITH MORCELLATION;  Surgeon: Billey Co, MD;  Location: [facility] ORS;  Service: Urology;  Laterality: N/A;    Social History   Tobacco Use   Smoking status: Some Days    Packs/day: 0.25    Years: 47.00    Total pack years: 11.75    Types: Cigarettes    Start date: 05/12/1968    Last attempt to quit: 06/08/2021    Years since quitting: 0.9    Passive exposure: Past   Smokeless tobacco: Never  Vaping Use   Vaping Use: Never used  Substance Use Topics   Alcohol use: No    Alcohol/week: 0.0 standard drinks of alcohol   Drug use: No     Medication list has been reviewed and updated.  Current Meds  Medication Sig   albuterol (PROVENTIL) (2.5 MG/3ML) 0.083% nebulizer solution TAKE 3 MLS BY NEBULIZATION EVERY 4 HOURSAS NEEDED FOR WHEEZING OR SHORTNESS OF BREATH   aspirin EC 81 MG tablet Take 81 mg by mouth daily. Swallow whole.   BAYER CONTOUR NEXT TEST test strip    Budeson-Glycopyrrol-Formoterol (BREZTRI AEROSPHERE) 160-9-4.8 MCG/ACT AERO Inhale 2 puffs into the lungs 2 (two) times daily.   Continuous Blood Gluc Sensor (DEXCOM G6 SENSOR) MISC by Does not apply route.   Cyanocobalamin 5000 MCG/ML LIQD Place under the tongue daily. Liquid   gabapentin (NEURONTIN) 300 MG capsule TAKE 2 CAPULES BY MOUTH THREE TIMES DAILY   HUMALOG 100 UNIT/ML injection Inject 80 Units into the skin See admin instructions. 80 units per day through insulin pump   losartan (COZAAR) 25 MG tablet TAKE 1 TABLET BY MOUTH ONCE DAILY   molnupiravir EUA (LAGEVRIO) 200 mg CAPS capsule Take 4 capsules (800 mg total) by mouth 2 (two) times daily for 5 days.   pravastatin (PRAVACHOL) 40 MG tablet TAKE 1 TABLET BY MOUTH DAILY   predniSONE  (DELTASONE) 10 MG tablet Take 6 tablets (60 mg total) by mouth daily with breakfast for 2 days, THEN 5 tablets (50 mg total) daily with breakfast for 2 days, THEN 4 tablets (40 mg total) daily with breakfast for 2 days, THEN 3 tablets (30 mg total) daily with breakfast for 2 days, THEN 2 tablets (20 mg total) daily with breakfast for 2 days, THEN 1 tablet (10 mg total) daily with breakfast for 2 days.   Spacer/Aero-Holding Chambers (AEROCHAMBER MV) inhaler Use as instructed   tadalafil (CIALIS) 5 MG tablet Take 1 tablet (5 mg total) by mouth daily as needed for erectile dysfunction.   VITAMIN D PO Take 5,000 Units by mouth daily.   zolpidem (AMBIEN) 10 MG tablet TAKE 1 TABLET BY MOUTH AT BEDTIME.   [DISCONTINUED] albuterol (VENTOLIN HFA) 108 (90 Base) MCG/ACT inhaler Inhale  2 puffs into the lungs every 4 (four) hours as needed for wheezing or shortness of breath.   [DISCONTINUED] fluticasone-salmeterol (ADVAIR HFA) 45-21 MCG/ACT inhaler Inhale 2 puffs into the lungs 2 (two) times daily.       05/20/2022   11:13 AM 04/19/2022   11:33 AM 03/09/2022    9:07 AM 07/26/2021    1:24 PM  GAD 7 : Generalized Anxiety Score  Nervous, Anxious, on Edge 1 1 0 1  Control/stop worrying 1 1 0 1  Worry too much - different things 1 1 0 1  Trouble relaxing 2 3 0 1  Restless 2 3 0 0  Easily annoyed or irritable '1 2 1 '$ 0  Afraid - awful might happen 0 0 0 0  Total GAD 7 Score '8 11 1 4  '$ Anxiety Difficulty  Extremely difficult Not difficult at all Not difficult at all       05/20/2022   11:13 AM 04/19/2022   11:33 AM 03/09/2022    9:06 AM  Depression screen PHQ 2/9  Decreased Interest '3 3 1  '$ Down, Depressed, Hopeless 0 0 0  PHQ - 2 Score '3 3 1  '$ Altered sleeping '1 1 2  '$ Tired, decreased energy '3 3 3  '$ Change in appetite 3 3 0  Feeling bad or failure about yourself  0 0 0  Trouble concentrating 1 1 0  Moving slowly or fidgety/restless  3 0  Suicidal thoughts 0 0 0  PHQ-9 Score '11 14 6  '$ Difficult doing  work/chores Not difficult at all Extremely dIfficult Not difficult at all    BP Readings from Last 3 Encounters:  05/03/22 (!) 108/57  05/02/22 125/72  04/19/22 112/60    Physical Exam Constitutional:      Appearance: He is ill-appearing.  Pulmonary:     Effort: Pulmonary effort is normal. Prolonged expiration present. No respiratory distress.     Comments: No cough or audible wheezing noted Neurological:     General: No focal deficit present.     Mental Status: He is alert.     Wt Readings from Last 3 Encounters:  05/02/22 186 lb 8.2 oz (84.6 kg)  04/19/22 186 lb 9.6 oz (84.6 kg)  03/09/22 182 lb (82.6 kg)    Ht '5\' 7"'$  (1.702 m)   BMI 29.21 kg/m   Assessment and Plan: Problem List Items Addressed This Visit       Respiratory   Chronic obstructive pulmonary disease (HCC)    Persistent shortness of breath after CAP, Influenza A and now Covid-19 Will give an additional steroid taper to last until Pulmonary appointment Breztri bid instead of Advair      Relevant Medications   predniSONE (DELTASONE) 10 MG tablet   Budeson-Glycopyrrol-Formoterol (BREZTRI AEROSPHERE) 160-9-4.8 MCG/ACT AERO   albuterol (VENTOLIN HFA) 108 (90 Base) MCG/ACT inhaler   Other Visit Diagnoses     COVID-19 virus infection    -  Primary   Within the 5 day treatment window will give Mulnupiravir and steroids   Relevant Medications   predniSONE (DELTASONE) 10 MG tablet   molnupiravir EUA (LAGEVRIO) 200 mg CAPS capsule       I spent 12 minutes on this encounter, 100% via video.  Partially dictated using Editor, commissioning. Any errors are unintentional.  Halina Maidens, MD [facility]  05/20/2022

## 2022-05-20 NOTE — Telephone Encounter (Signed)
Reviewed chart and see pt had VV at 1130 with PCP for COVID positive. Will close NT encounter.

## 2022-05-20 NOTE — Telephone Encounter (Signed)
Patient called, left VM to return the call to the office to discuss symptoms with a nurse.  Summary: cold and flu like symptoms / rx req   The patient has tested positive for COVID 19 today 05/20/22  The patient shares that they are having coughing as well as  body and head aching  The patient would like to be prescribed something for their discomfort  Please contact further when possible

## 2022-05-20 NOTE — Assessment & Plan Note (Signed)
Persistent shortness of breath after CAP, Influenza A and now Covid-19 Will give an additional steroid taper to last until Pulmonary appointment Breztri bid instead of Advair

## 2022-05-20 NOTE — Telephone Encounter (Signed)
2nd attempt, Patient called, left VM to return the call to the office to discuss symptoms with a nurse.  

## 2022-05-24 ENCOUNTER — Ambulatory Visit: Payer: Medicare Other | Admitting: Internal Medicine

## 2022-05-27 DIAGNOSIS — Z87891 Personal history of nicotine dependence: Secondary | ICD-10-CM | POA: Diagnosis not present

## 2022-05-27 DIAGNOSIS — J449 Chronic obstructive pulmonary disease, unspecified: Secondary | ICD-10-CM | POA: Diagnosis not present

## 2022-05-27 DIAGNOSIS — R053 Chronic cough: Secondary | ICD-10-CM | POA: Diagnosis not present

## 2022-05-27 DIAGNOSIS — R0602 Shortness of breath: Secondary | ICD-10-CM | POA: Diagnosis not present

## 2022-05-30 ENCOUNTER — Ambulatory Visit (INDEPENDENT_AMBULATORY_CARE_PROVIDER_SITE_OTHER): Payer: Medicare Other | Admitting: Internal Medicine

## 2022-05-30 ENCOUNTER — Encounter: Payer: Self-pay | Admitting: Internal Medicine

## 2022-05-30 VITALS — BP 122/68 | HR 84 | Ht 67.0 in | Wt 191.8 lb

## 2022-05-30 DIAGNOSIS — E785 Hyperlipidemia, unspecified: Secondary | ICD-10-CM

## 2022-05-30 DIAGNOSIS — I10 Essential (primary) hypertension: Secondary | ICD-10-CM | POA: Diagnosis not present

## 2022-05-30 DIAGNOSIS — J441 Chronic obstructive pulmonary disease with (acute) exacerbation: Secondary | ICD-10-CM

## 2022-05-30 DIAGNOSIS — Z125 Encounter for screening for malignant neoplasm of prostate: Secondary | ICD-10-CM

## 2022-05-30 DIAGNOSIS — F39 Unspecified mood [affective] disorder: Secondary | ICD-10-CM | POA: Diagnosis not present

## 2022-05-30 DIAGNOSIS — I700 Atherosclerosis of aorta: Secondary | ICD-10-CM | POA: Diagnosis not present

## 2022-05-30 DIAGNOSIS — Z0000 Encounter for general adult medical examination without abnormal findings: Secondary | ICD-10-CM

## 2022-05-30 DIAGNOSIS — E1069 Type 1 diabetes mellitus with other specified complication: Secondary | ICD-10-CM | POA: Diagnosis not present

## 2022-05-30 DIAGNOSIS — E1040 Type 1 diabetes mellitus with diabetic neuropathy, unspecified: Secondary | ICD-10-CM

## 2022-05-30 MED ORDER — PRAVASTATIN SODIUM 40 MG PO TABS: 40.0000 mg | ORAL_TABLET | Freq: Every day | ORAL | 3 refills | Status: DC

## 2022-05-30 MED ORDER — BENZONATATE 100 MG PO CAPS: 100.0000 mg | ORAL_CAPSULE | Freq: Three times a day (TID) | ORAL | 0 refills | Status: AC

## 2022-05-30 NOTE — Assessment & Plan Note (Signed)
Clinically stable exam with well controlled BP on losartan. Tolerating medications without side effects. Pt to continue current regimen and low sodium diet.  

## 2022-05-30 NOTE — Progress Notes (Signed)
Date:  05/30/2022   Name:  Paul Stokes   DOB:  1955-11-29   MRN:  [id number]   Chief Complaint: Annual Exam Paul Stokes is a 67 y.o. male who presents today for his Complete Annual Exam. He feels well. He reports exercising - none. He reports he is sleeping fairly well. He is finally recovering from his respiratory illnesses.    Colonoscopy: 51025 repeat 5 yrs  Immunization History  Administered Date(s) Administered   Influenza Inj Mdck Quad Pf 03/12/2018, 02/22/2019   Influenza Split 03/05/2020   Influenza,inj,Quad PF,6+ Mos 03/12/2018   Influenza-Unspecified 03/15/2015, 03/06/2019, 05/27/2021   PFIZER Comirnaty(Gray Top)Covid-19 Tri-Sucrose Vaccine 07/30/2019, 08/20/2019   PFIZER(Purple Top)SARS-COV-2 Vaccination 07/30/2019, 08/20/2019   PNEUMOCOCCAL CONJUGATE-20 05/27/2022   Pneumococcal Polysaccharide-23 05/17/2010, 03/05/2020   Zoster, Live 05/23/2013   Health Maintenance Due  Topic Date Due   DTaP/Tdap/Td (1 - Tdap) Never done   Zoster Vaccines- Shingrix (1 of 2) Never done   COVID-19 Vaccine (5 - 2023-24 season) 01/14/2022   Diabetic kidney evaluation - Urine ACR  05/26/2022    Lab Results  Component Value Date   PSA1 3.1 08/06/2018   PSA1 3.3 06/15/2015   PSA 2.6 05/23/2013    Hypertension This is a chronic problem. The problem is controlled. Associated symptoms include shortness of breath. Pertinent negatives include no chest pain, headaches or palpitations.  Hyperlipidemia Associated symptoms include shortness of breath. Pertinent negatives include no chest pain or myalgias. Current antihyperlipidemic treatment includes statins. The current treatment provides moderate improvement of lipids.  Diabetes He has type 1 diabetes mellitus. Pertinent negatives for hypoglycemia include no dizziness, headaches or nervousness/anxiousness. Pertinent negatives for diabetes include no chest pain and no fatigue.  COPD - recent pneumonia then Influenza A followed by Covid  29.  Seen by Pulmonary.  On steroid taper and will then take prednisone 10 mg daily.  Lab Results  Component Value Date   NA 134 (L) 05/03/2022   K 4.1 05/03/2022   CO2 23 05/03/2022   GLUCOSE 344 (H) 05/03/2022   BUN 18 05/03/2022   CREATININE 1.05 05/03/2022   CALCIUM 8.1 (L) 05/03/2022   EGFR 72 05/26/2021   GFRNONAA >60 05/03/2022   Lab Results  Component Value Date   CHOL 159 05/26/2021   HDL 58 05/26/2021   LDLCALC 88 05/26/2021   TRIG 69 05/26/2021   CHOLHDL 2.7 05/26/2021   Lab Results  Component Value Date   TSH 1.510 08/06/2020   Lab Results  Component Value Date   HGBA1C 8.6 (H) 05/02/2022   Lab Results  Component Value Date   WBC 5.6 05/03/2022   HGB 13.2 05/03/2022   HCT 41.1 05/03/2022   MCV 90.7 05/03/2022   PLT 286 05/03/2022   Lab Results  Component Value Date   ALT 27 05/02/2022   AST 46 (H) 05/02/2022   ALKPHOS 71 05/02/2022   BILITOT 1.3 (H) 05/02/2022   No results found for: "25OHVITD2", "25OHVITD3", "VD25OH"   Review of Systems  Constitutional:  Negative for appetite change, chills, diaphoresis, fatigue and unexpected weight change.  HENT:  Positive for hearing loss and tinnitus. Negative for trouble swallowing and voice change.   Eyes:  Negative for visual disturbance.  Respiratory:  Positive for cough and shortness of breath. Negative for choking and wheezing.   Cardiovascular:  Negative for chest pain, palpitations and leg swelling.  Gastrointestinal:  Negative for abdominal pain, blood in stool, constipation and diarrhea.  Genitourinary:  Negative for  difficulty urinating, dysuria and frequency.  Musculoskeletal:  Negative for arthralgias, back pain and myalgias.  Skin:  Negative for color change and rash.  Neurological:  Negative for dizziness, syncope and headaches.  Hematological:  Negative for adenopathy.  Psychiatric/Behavioral:  Positive for sleep disturbance. Negative for dysphoric mood. The patient is not nervous/anxious.      Patient Active Problem List   Diagnosis Date Noted   Essential hypertension 05/03/2022   Dyslipidemia 05/03/2022   Depression 05/03/2022   Influenza A with pneumonia 05/03/2022   Chronic obstructive pulmonary disease ([place]) 05/02/2022   Benign essential tremor 07/26/2021   History of gross hematuria 01/05/2021   Personal history of colonic polyps    Aortic atherosclerosis ([place]) 08/27/2019   Dyspepsia 08/06/2018   Acute bilateral low back pain with right-sided sciatica 11/29/2017   Mood disorder ([place]) 11/15/2016   BPH with obstruction/lower urinary tract symptoms 06/15/2015   Hyperlipidemia due to type 1 diabetes mellitus ([place]) 06/15/2015   Insomnia, persistent 03/06/2015   Compulsive tobacco user syndrome 03/06/2015   Type 1 diabetes mellitus with sensory neuropathy ([place]) 03/06/2015   Presence of insulin pump 06/08/2014    Allergies  Allergen Reactions   Lisinopril Cough    Past Surgical History:  Procedure Laterality Date   CATARACT EXTRACTION, BILATERAL     COLONOSCOPY  08/14/2013   7 tubular adenomas   COLONOSCOPY WITH PROPOFOL N/A 09/10/2019   Procedure: COLONOSCOPY WITH PROPOFOL;  Surgeon: [name], MD;  Location: [place] ENDOSCOPY;  Service: Endoscopy;  Laterality: N/A;   COLONOSCOPY WITH PROPOFOL N/A 09/11/2019   Procedure: COLONOSCOPY WITH PROPOFOL;  Surgeon: Jonathon Bellows, MD;  Location[facility]COPY;  Service: Gastroenterology;  Laterality: N/A;   HOLEP-LASER ENUCLEATION OF THE PROSTATE WITH MORCELLATION N/A 04/02/2021   Procedure: HOLEP-LASER ENUCLEATION OF THE PROSTATE WITH MORCELLATION;  Surgeon: Billey Co, MD;  Location: [facility] ORS;  Service: Urology;  Laterality: N/A;    Social History   Tobacco Use   Smoking status: Former    Packs/day: 0.25    Years: 52.00    Total pack years: 13.00    Types: Cigarettes    Start date: 05/12/1968    Quit date: 04/13/2022    Years since quitting: 0.1    Passive exposure: Past   Smokeless tobacco: Never   Vaping Use   Vaping Use: Never used  Substance Use Topics   Alcohol use: No    Alcohol/week: 0.0 standard drinks of alcohol   Drug use: No     Medication list has been reviewed and updated.  Current Meds  Medication Sig   albuterol (PROVENTIL) (2.5 MG/3ML) 0.083% nebulizer solution TAKE 3 MLS BY NEBULIZATION EVERY 4 HOURSAS NEEDED FOR WHEEZING OR SHORTNESS OF BREATH   albuterol (VENTOLIN HFA) 108 (90 Base) MCG/ACT inhaler Inhale 2 puffs into the lungs every 4 (four) hours as needed for wheezing or shortness of breath.   aspirin EC 81 MG tablet Take 81 mg by mouth daily. Swallow whole.   BAYER CONTOUR NEXT TEST test strip    benzonatate (TESSALON) 100 MG capsule Take 1 capsule (100 mg total) by mouth 3 (three) times daily for 20 days.   Budeson-Glycopyrrol-Formoterol (BREZTRI AEROSPHERE) 160-9-4.8 MCG/ACT AERO Inhale 2 puffs into the lungs 2 (two) times daily.   Continuous Blood Gluc Sensor (DEXCOM G6 SENSOR) MISC by Does not apply route.   Cyanocobalamin 5000 MCG/ML LIQD Place under the tongue daily. Liquid   gabapentin (NEURONTIN) 300 MG capsule TAKE 2 CAPULES BY MOUTH THREE TIMES DAILY  HUMALOG 100 UNIT/ML injection Inject 80 Units into the skin See admin instructions. 80 units per day through insulin pump   losartan (COZAAR) 25 MG tablet TAKE 1 TABLET BY MOUTH ONCE DAILY   predniSONE (DELTASONE) 10 MG tablet Take 6 tablets (60 mg total) by mouth daily with breakfast for 2 days, THEN 5 tablets (50 mg total) daily with breakfast for 2 days, THEN 4 tablets (40 mg total) daily with breakfast for 2 days, THEN 3 tablets (30 mg total) daily with breakfast for 2 days, THEN 2 tablets (20 mg total) daily with breakfast for 2 days, THEN 1 tablet (10 mg total) daily with breakfast for 2 days.   predniSONE (DELTASONE) 10 MG tablet Take 10 mg by mouth daily with breakfast.   Spacer/Aero-Holding Chambers (AEROCHAMBER MV) inhaler Use as instructed   tadalafil (CIALIS) 5 MG tablet Take 1 tablet (5 mg  total) by mouth daily as needed for erectile dysfunction.   VITAMIN D PO Take 5,000 Units by mouth daily.   zolpidem (AMBIEN) 10 MG tablet TAKE 1 TABLET BY MOUTH AT BEDTIME.   [DISCONTINUED] pravastatin (PRAVACHOL) 40 MG tablet TAKE 1 TABLET BY MOUTH DAILY       05/30/2022    8:14 AM 05/20/2022   11:13 AM 04/19/2022   11:33 AM 03/09/2022    9:07 AM  GAD 7 : Generalized Anxiety Score  Nervous, Anxious, on Edge '2 1 1 '$ 0  Control/stop worrying '1 1 1 '$ 0  Worry too much - different things '1 1 1 '$ 0  Trouble relaxing '2 2 3 '$ 0  Restless '2 2 3 '$ 0  Easily annoyed or irritable '2 1 2 1  '$ Afraid - awful might happen 1 0 0 0  Total GAD 7 Score '11 8 11 1  '$ Anxiety Difficulty Somewhat difficult  Extremely difficult Not difficult at all       05/30/2022    8:14 AM 05/20/2022   11:13 AM 04/19/2022   11:33 AM  Depression screen PHQ 2/9  Decreased Interest '3 3 3  '$ Down, Depressed, Hopeless 1 0 0  PHQ - 2 Score '4 3 3  '$ Altered sleeping '1 1 1  '$ Tired, decreased energy '3 3 3  '$ Change in appetite '1 3 3  '$ Feeling bad or failure about yourself  0 0 0  Trouble concentrating '1 1 1  '$ Moving slowly or fidgety/restless 1  3  Suicidal thoughts 0 0 0  PHQ-9 Score '11 11 14  '$ Difficult doing work/chores Very difficult Not difficult at all Extremely dIfficult    BP Readings from Last 3 Encounters:  05/30/22 122/68  05/03/22 (!) 108/57  05/02/22 125/72    Physical Exam Vitals and nursing note reviewed.  Constitutional:      Appearance: Normal appearance. He is well-developed.  HENT:     Head: Normocephalic.     Right Ear: Tympanic membrane, ear canal and external ear normal.     Left Ear: Tympanic membrane, ear canal and external ear normal.     Nose: Nose normal.  Eyes:     Conjunctiva/sclera: Conjunctivae normal.     Pupils: Pupils are equal, round, and reactive to light.  Neck:     Thyroid: No thyromegaly.     Vascular: No carotid bruit.  Cardiovascular:     Rate and Rhythm: Normal rate and regular rhythm.      Heart sounds: Normal heart sounds.  Pulmonary:     Effort: Pulmonary effort is normal.     Breath sounds: Decreased breath  sounds present. No wheezing or rhonchi.  Chest:  Breasts:    Right: No mass.     Left: No mass.  Abdominal:     General: Bowel sounds are normal.     Palpations: Abdomen is soft.     Tenderness: There is no abdominal tenderness.  Musculoskeletal:        General: Normal range of motion.     Cervical back: Normal range of motion and neck supple.  Lymphadenopathy:     Cervical: No cervical adenopathy.  Skin:    General: Skin is warm and dry.     Capillary Refill: Capillary refill takes less than 2 seconds.  Neurological:     General: No focal deficit present.     Mental Status: He is alert and oriented to person, place, and time.     Deep Tendon Reflexes: Reflexes are normal and symmetric.  Psychiatric:        Attention and Perception: Attention normal.        Mood and Affect: Mood normal.        Thought Content: Thought content normal.     Wt Readings from Last 3 Encounters:  05/30/22 191 lb 12.8 oz (87 kg)  05/02/22 186 lb 8.2 oz (84.6 kg)  04/19/22 186 lb 9.6 oz (84.6 kg)    BP 122/68   Pulse 84   Ht '5\' 7"'$  (1.702 m)   Wt 191 lb 12.8 oz (87 kg)   SpO2 93%   BMI 30.04 kg/m   Assessment and Plan: Problem List Items Addressed This Visit       Cardiovascular and Mediastinum   Aortic atherosclerosis ([place])   Relevant Medications   pravastatin (PRAVACHOL) 40 MG tablet   Essential hypertension    Clinically stable exam with well controlled BP on losartan. Tolerating medications without side effects. Pt to continue current regimen and low sodium diet.       Relevant Medications   pravastatin (PRAVACHOL) 40 MG tablet     Endocrine   Hyperlipidemia due to type 1 diabetes mellitus ([place])    Tolerating statin medication without side effects at this time LDL is  Lab Results  Component Value Date   LDLCALC 88 05/26/2021  with a goal of <  70. Current dose is appropriate but will be adjusted if needed.       Relevant Medications   pravastatin (PRAVACHOL) 40 MG tablet   Other Relevant Orders   Lipid panel   Type 1 diabetes mellitus with sensory neuropathy ([place])    Followed by Endo Has insulin pump 12/23 A1C 8.6 Foot exam due      Relevant Medications   pravastatin (PRAVACHOL) 40 MG tablet   Other Relevant Orders   Comprehensive metabolic panel   Microalbumin / creatinine urine ratio     Other   Mood disorder ([place])   Other Visit Diagnoses     Annual physical exam    -  Primary   Prostate cancer screening       Relevant Orders   PSA   Chronic obstructive pulmonary disease with (acute) exacerbation (HCC)   (Chronic)     staring on daily prednisone   Relevant Medications   predniSONE (DELTASONE) 10 MG tablet   benzonatate (TESSALON) 100 MG capsule        Partially dictated using Editor, commissioning. Any errors are unintentional.  Halina Maidens, MD [facility]  05/30/2022

## 2022-05-30 NOTE — Assessment & Plan Note (Signed)
Tolerating statin medication without side effects at this time LDL is  Lab Results  Component Value Date   LDLCALC 88 05/26/2021   with a goal of < 70. Current dose is appropriate but will be adjusted if needed.

## 2022-05-30 NOTE — Assessment & Plan Note (Signed)
Followed by Endo Has insulin pump 12/23 A1C 8.6 Foot exam due

## 2022-05-31 LAB — COMPREHENSIVE METABOLIC PANEL
ALT: 23 IU/L (ref 0–44)
AST: 7 IU/L (ref 0–40)
Albumin/Globulin Ratio: 2 (ref 1.2–2.2)
Albumin: 3.8 g/dL — ABNORMAL LOW (ref 3.9–4.9)
Alkaline Phosphatase: 89 IU/L (ref 44–121)
BUN/Creatinine Ratio: 26 — ABNORMAL HIGH (ref 10–24)
BUN: 25 mg/dL (ref 8–27)
Bilirubin Total: 0.7 mg/dL (ref 0.0–1.2)
CO2: 25 mmol/L (ref 20–29)
Calcium: 9.3 mg/dL (ref 8.6–10.2)
Chloride: 101 mmol/L (ref 96–106)
Creatinine, Ser: 0.97 mg/dL (ref 0.76–1.27)
Globulin, Total: 1.9 g/dL (ref 1.5–4.5)
Glucose: 129 mg/dL — ABNORMAL HIGH (ref 70–99)
Potassium: 4.6 mmol/L (ref 3.5–5.2)
Sodium: 139 mmol/L (ref 134–144)
Total Protein: 5.7 g/dL — ABNORMAL LOW (ref 6.0–8.5)
eGFR: 86 mL/min/{1.73_m2} (ref >59)

## 2022-05-31 LAB — LIPID PANEL
Chol/HDL Ratio: 2 ratio (ref 0.0–5.0)
Cholesterol, Total: 154 mg/dL (ref 100–199)
HDL: 77 mg/dL (ref >39)
LDL Chol Calc (NIH): 62 mg/dL (ref 0–99)
Triglycerides: 80 mg/dL (ref 0–149)
VLDL Cholesterol Cal: 15 mg/dL (ref 5–40)

## 2022-05-31 LAB — PSA: Prostate Specific Ag, Serum: 0.7 ng/mL (ref 0.0–4.0)

## 2022-05-31 LAB — MICROALBUMIN / CREATININE URINE RATIO
Creatinine, Urine: 174.2 mg/dL
Microalb/Creat Ratio: 3 mg/g creat (ref 0–29)
Microalbumin, Urine: 5.1 ug/mL

## 2022-06-01 ENCOUNTER — Ambulatory Visit: Payer: Self-pay

## 2022-06-01 NOTE — Telephone Encounter (Signed)
2nd attempt, Patient called, left VM to return the call to the office to discuss symptoms with a nurse.  

## 2022-06-01 NOTE — Telephone Encounter (Signed)
The patient shares with their wife that they are continuing to experience cough and congestion as well as discomfort  The patient was previously seen on 05/30/22 and prescribed medication for their symptoms which has been ineffective  Please contact the patient further at [phone] when possible  Left message to call back about symptoms.

## 2022-06-01 NOTE — Telephone Encounter (Signed)
  Chief Complaint: fatigue Symptoms: fatigue, feeling tired and exhausted  Frequency: ongoing some days are better than others, today a worse day  Pertinent Negatives: NA Disposition: '[]'$ ED /'[]'$ Urgent Care (no appt availability in office) / '[]'$ Appointment(In office/virtual)/ '[]'$  [facility]/ '[x]'$ Home Care/ '[]'$ Refused Recommended Disposition /'[]'$ [facility]/ '[]'$  Follow-up with PCP Additional Notes: pt states his cough is improving but he just feels extremely fatigued. Not having worsening SOB but just tired when walking from room to room. Pt states he has been sick ongoing since Nov with PNA, Flu and COVID most recent.  Pt states he felt fine that day and yesterday was bad day and today has just been worse. Advised pt to drink plenty of fluids, rest, and if no improvement by Monday to call back and see what Dr. Army Melia has since pt was just in office on 05/30/22.  Reason for Disposition  Mild weakness or fatigue with acute minor illness (e.g., colds)  Answer Assessment - Initial Assessment Questions 1. DESCRIPTION: "Describe how you are feeling."     Just feeling fatigued and tired  2. SEVERITY: "How bad is it?"  "Can you stand and walk?"   - MILD (0-3): Feels weak or tired, but does not interfere with work, school or normal activities.   - MODERATE (4-7): Able to stand and walk; weakness interferes with work, school, or normal activities.   - SEVERE (8-10): Unable to stand or walk; unable to do usual activities.     Moderate  3. ONSET: "When did these symptoms begin?" (e.g., hours, days, weeks, months)     Ongoing for weeks, gets better some days and others are worse  4. CAUSE: "What do you think is causing the weakness or fatigue?" (e.g., not drinking enough fluids, medical problem, trouble sleeping)     Had PNA, Flu and COVID since Nov.  5. NEW MEDICINES:  "Have you started on any new medicines recently?" (e.g., opioid pain medicines, benzodiazepines, muscle relaxants,  antidepressants, antihistamines, neuroleptics, beta blockers)     no 6. OTHER SYMPTOMS: "Do you have any other symptoms?" (e.g., chest pain, fever, cough, SOB, vomiting, diarrhea, bleeding, other areas of pain)  Protocols used: Weakness (Generalized) and Fatigue-A-AH

## 2022-06-02 ENCOUNTER — Telehealth: Payer: Self-pay | Admitting: Internal Medicine

## 2022-06-02 NOTE — Telephone Encounter (Signed)
Copied from [place] [id number]. Topic: General - Other >> Jun 01, 2022  4:45 PM Leilani Able wrote: Reason for CRM: Pt had called in as office number on phone/ was re labs but pt states his labs and everything is good but he had felt ok on Monday and now he just really feels bad again. Seemed unsure wether to make appt again, with COPD how long should this last FU if warrants [phone] Seems to want to talk to Baxter Flattery.

## 2022-06-02 NOTE — Telephone Encounter (Signed)
Please call the patient and scheduled him with first available provider for cough.  Thank you

## 2022-06-10 ENCOUNTER — Encounter: Payer: Self-pay | Admitting: *Deleted

## 2022-06-18 DIAGNOSIS — E1165 Type 2 diabetes mellitus with hyperglycemia: Secondary | ICD-10-CM | POA: Diagnosis not present

## 2022-06-20 ENCOUNTER — Other Ambulatory Visit: Payer: Self-pay | Admitting: Internal Medicine

## 2022-06-20 ENCOUNTER — Encounter: Payer: Self-pay | Admitting: Internal Medicine

## 2022-06-20 DIAGNOSIS — J449 Chronic obstructive pulmonary disease, unspecified: Secondary | ICD-10-CM

## 2022-06-20 MED ORDER — PREDNISONE 10 MG PO TABS: ORAL_TABLET | ORAL | 0 refills | Status: AC

## 2022-06-29 DIAGNOSIS — J449 Chronic obstructive pulmonary disease, unspecified: Secondary | ICD-10-CM | POA: Diagnosis not present

## 2022-06-29 DIAGNOSIS — R0602 Shortness of breath: Secondary | ICD-10-CM | POA: Diagnosis not present

## 2022-06-29 DIAGNOSIS — F17210 Nicotine dependence, cigarettes, uncomplicated: Secondary | ICD-10-CM | POA: Diagnosis not present

## 2022-07-04 DIAGNOSIS — E1065 Type 1 diabetes mellitus with hyperglycemia: Secondary | ICD-10-CM | POA: Diagnosis not present

## 2022-07-12 DIAGNOSIS — E10649 Type 1 diabetes mellitus with hypoglycemia without coma: Secondary | ICD-10-CM | POA: Diagnosis not present

## 2022-07-12 DIAGNOSIS — E1040 Type 1 diabetes mellitus with diabetic neuropathy, unspecified: Secondary | ICD-10-CM | POA: Diagnosis not present

## 2022-07-12 DIAGNOSIS — E1065 Type 1 diabetes mellitus with hyperglycemia: Secondary | ICD-10-CM | POA: Diagnosis not present

## 2022-07-12 DIAGNOSIS — E1059 Type 1 diabetes mellitus with other circulatory complications: Secondary | ICD-10-CM | POA: Diagnosis not present

## 2022-07-12 DIAGNOSIS — E782 Mixed hyperlipidemia: Secondary | ICD-10-CM | POA: Diagnosis not present

## 2022-07-15 ENCOUNTER — Other Ambulatory Visit: Payer: Self-pay | Admitting: Internal Medicine

## 2022-07-15 DIAGNOSIS — R809 Proteinuria, unspecified: Secondary | ICD-10-CM

## 2022-07-15 MED ORDER — LOSARTAN POTASSIUM 25 MG PO TABS: 25.0000 mg | ORAL_TABLET | Freq: Every day | ORAL | 1 refills | Status: DC

## 2022-07-15 NOTE — Telephone Encounter (Signed)
Medication Refill - Medication: losartan (COZAAR) 25 MG tablet   Has the patient contacted their pharmacy? Yes.     Preferred Pharmacy (with phone number or street name):  [place], [place] - [place] [place] Phone: [phone]  Fax: [phone]     Has the patient been seen for an appointment in the last year OR does the patient have an upcoming appointment? Yes.    The patient states he has 4 pills left. Please assist patient further

## 2022-07-15 NOTE — Telephone Encounter (Signed)
[facility] called, spoke with [place], [facility]. Gave refill request order for Losartan '25mg'$  QD #90/1 RF.

## 2022-07-15 NOTE — Telephone Encounter (Signed)
Requested Prescriptions  Pending Prescriptions Disp Refills   losartan (COZAAR) 25 MG tablet 90 tablet 1    Sig: Take 1 tablet (25 mg total) by mouth daily.     Cardiovascular:  Angiotensin Receptor Blockers Passed - 07/15/2022  1:39 PM      Passed - Cr in normal range and within 180 days    Creatinine, Ser  Date Value Ref Range Status  05/30/2022 0.97 0.76 - 1.27 mg/dL Final         Passed - K in normal range and within 180 days    Potassium  Date Value Ref Range Status  05/30/2022 4.6 3.5 - 5.2 mmol/L Final         Passed - Patient is not pregnant      Passed - Last BP in normal range    BP Readings from Last 1 Encounters:  05/30/22 122/68         Passed - Valid encounter within last 6 months    Recent Outpatient Visits           1 month ago Annual physical exam   [place] [facility] at [facility], Jesse Sans, MD   1 month ago COVID-19 virus infection   [facility] at [facility], Jesse Sans, MD   2 months ago Community acquired pneumonia of right middle lobe of lung   [place] at [facility], Jesse Sans, MD   4 months ago [place] at [facility], Jesse Sans, MD   11 months ago Centrilobular emphysema [facility])   [place] at [facility], Jesse Sans, MD       Future Appointments             In 4 months Army Melia, Jesse Sans, MD [place] at [facility], [facility]   In 10 months Army Melia, Jesse Sans, MD [place] at [facility], [facility]

## 2022-07-17 DIAGNOSIS — E1165 Type 2 diabetes mellitus with hyperglycemia: Secondary | ICD-10-CM | POA: Diagnosis not present

## 2022-08-12 ENCOUNTER — Other Ambulatory Visit: Payer: Self-pay | Admitting: Internal Medicine

## 2022-08-12 DIAGNOSIS — G4700 Insomnia, unspecified: Secondary | ICD-10-CM

## 2022-08-12 DIAGNOSIS — E1040 Type 1 diabetes mellitus with diabetic neuropathy, unspecified: Secondary | ICD-10-CM

## 2022-08-17 DIAGNOSIS — E1165 Type 2 diabetes mellitus with hyperglycemia: Secondary | ICD-10-CM | POA: Diagnosis not present

## 2022-08-29 ENCOUNTER — Other Ambulatory Visit: Payer: Self-pay

## 2022-08-29 DIAGNOSIS — Z87891 Personal history of nicotine dependence: Secondary | ICD-10-CM

## 2022-08-29 DIAGNOSIS — Z122 Encounter for screening for malignant neoplasm of respiratory organs: Secondary | ICD-10-CM

## 2022-09-16 DIAGNOSIS — E1165 Type 2 diabetes mellitus with hyperglycemia: Secondary | ICD-10-CM | POA: Diagnosis not present

## 2022-09-26 DIAGNOSIS — H524 Presbyopia: Secondary | ICD-10-CM | POA: Diagnosis not present

## 2022-09-26 DIAGNOSIS — E103291 Type 1 diabetes mellitus with mild nonproliferative diabetic retinopathy without macular edema, right eye: Secondary | ICD-10-CM | POA: Diagnosis not present

## 2022-09-26 DIAGNOSIS — Z961 Presence of intraocular lens: Secondary | ICD-10-CM | POA: Diagnosis not present

## 2022-09-26 DIAGNOSIS — H43813 Vitreous degeneration, bilateral: Secondary | ICD-10-CM | POA: Diagnosis not present

## 2022-09-27 ENCOUNTER — Ambulatory Visit
Admission: RE | Admit: 2022-09-27 | Discharge: 2022-09-27 | Disposition: A | Discharge status: Home / Self Care | Payer: Medicare Other | Source: Ambulatory Visit | Attending: Internal Medicine | Admitting: Internal Medicine

## 2022-09-27 DIAGNOSIS — Z122 Encounter for screening for malignant neoplasm of respiratory organs: Secondary | ICD-10-CM | POA: Diagnosis not present

## 2022-09-27 DIAGNOSIS — Z87891 Personal history of nicotine dependence: Secondary | ICD-10-CM

## 2022-10-02 ENCOUNTER — Other Ambulatory Visit: Payer: Self-pay | Admitting: Acute Care

## 2022-10-02 DIAGNOSIS — Z87891 Personal history of nicotine dependence: Secondary | ICD-10-CM

## 2022-10-02 DIAGNOSIS — Z122 Encounter for screening for malignant neoplasm of respiratory organs: Secondary | ICD-10-CM

## 2022-10-04 DIAGNOSIS — E1040 Type 1 diabetes mellitus with diabetic neuropathy, unspecified: Secondary | ICD-10-CM | POA: Diagnosis not present

## 2022-10-04 LAB — HEMOGLOBIN A1C: Hemoglobin A1C: 7.8

## 2022-10-05 DIAGNOSIS — J449 Chronic obstructive pulmonary disease, unspecified: Secondary | ICD-10-CM | POA: Diagnosis not present

## 2022-10-05 DIAGNOSIS — I2584 Coronary atherosclerosis due to calcified coronary lesion: Secondary | ICD-10-CM | POA: Diagnosis not present

## 2022-10-05 DIAGNOSIS — I2510 Atherosclerotic heart disease of native coronary artery without angina pectoris: Secondary | ICD-10-CM | POA: Diagnosis not present

## 2022-10-05 DIAGNOSIS — E1065 Type 1 diabetes mellitus with hyperglycemia: Secondary | ICD-10-CM | POA: Diagnosis not present

## 2022-10-05 DIAGNOSIS — R079 Chest pain, unspecified: Secondary | ICD-10-CM | POA: Diagnosis not present

## 2022-10-06 ENCOUNTER — Other Ambulatory Visit: Payer: Self-pay | Admitting: Internal Medicine

## 2022-10-06 DIAGNOSIS — R809 Proteinuria, unspecified: Secondary | ICD-10-CM

## 2022-10-06 NOTE — Telephone Encounter (Signed)
Requested Prescriptions  Refused Prescriptions Disp Refills   losartan (COZAAR) 25 MG tablet [Pharmacy Med Name: LOSARTAN POTASSIUM 25 MG TAB] 90 tablet 1    Sig: TAKE ONE TABLET (25 MG) BY MOUTH EVERY DAY     Cardiovascular:  Angiotensin Receptor Blockers Passed - 10/06/2022  5:04 PM      Passed - Cr in normal range and within 180 days    Creatinine, Ser  Date Value Ref Range Status  05/30/2022 0.97 0.76 - 1.27 mg/dL Final         Passed - K in normal range and within 180 days    Potassium  Date Value Ref Range Status  05/30/2022 4.6 3.5 - 5.2 mmol/L Final         Passed - Patient is not pregnant      Passed - Last BP in normal range    BP Readings from Last 1 Encounters:  05/30/22 122/68         Passed - Valid encounter within last 6 months    Recent Outpatient Visits           4 months ago Annual physical exam   [place] [facility] at [facility], Nyoka Cowden, MD   4 months ago COVID-19 virus infection   [facility] at [facility], Nyoka Cowden, MD   5 months ago Community acquired pneumonia of right middle lobe of lung   [place] [facility] at [facility], Nyoka Cowden, MD   7 months ago Bronchitis   [facility] at [facility], Nyoka Cowden, MD   1 year ago Centrilobular emphysema [facility])   [place] [facility] at [facility], Nyoka Cowden, MD       Future Appointments             In 1 month Judithann Graves, Nyoka Cowden, MD [facility] at [facility], [facility]   In 7 months Judithann Graves, Nyoka Cowden, MD [facility] at [facility], [facility]

## 2022-10-11 DIAGNOSIS — E1059 Type 1 diabetes mellitus with other circulatory complications: Secondary | ICD-10-CM | POA: Diagnosis not present

## 2022-10-11 DIAGNOSIS — Z9641 Presence of insulin pump (external) (internal): Secondary | ICD-10-CM | POA: Diagnosis not present

## 2022-10-11 DIAGNOSIS — E1040 Type 1 diabetes mellitus with diabetic neuropathy, unspecified: Secondary | ICD-10-CM | POA: Diagnosis not present

## 2022-10-11 DIAGNOSIS — E782 Mixed hyperlipidemia: Secondary | ICD-10-CM | POA: Diagnosis not present

## 2022-10-11 DIAGNOSIS — E10649 Type 1 diabetes mellitus with hypoglycemia without coma: Secondary | ICD-10-CM | POA: Diagnosis not present

## 2022-10-12 ENCOUNTER — Other Ambulatory Visit: Payer: Self-pay | Admitting: Internal Medicine

## 2022-10-12 DIAGNOSIS — I2089 Other forms of angina pectoris: Secondary | ICD-10-CM | POA: Diagnosis not present

## 2022-10-12 DIAGNOSIS — J449 Chronic obstructive pulmonary disease, unspecified: Secondary | ICD-10-CM | POA: Diagnosis not present

## 2022-10-12 DIAGNOSIS — I700 Atherosclerosis of aorta: Secondary | ICD-10-CM | POA: Diagnosis not present

## 2022-10-12 DIAGNOSIS — R0789 Other chest pain: Secondary | ICD-10-CM | POA: Diagnosis not present

## 2022-10-12 DIAGNOSIS — I10 Essential (primary) hypertension: Secondary | ICD-10-CM | POA: Diagnosis not present

## 2022-10-12 DIAGNOSIS — E10649 Type 1 diabetes mellitus with hypoglycemia without coma: Secondary | ICD-10-CM | POA: Diagnosis not present

## 2022-10-12 DIAGNOSIS — R0602 Shortness of breath: Secondary | ICD-10-CM | POA: Diagnosis not present

## 2022-10-12 DIAGNOSIS — E782 Mixed hyperlipidemia: Secondary | ICD-10-CM | POA: Diagnosis not present

## 2022-10-12 DIAGNOSIS — R079 Chest pain, unspecified: Secondary | ICD-10-CM

## 2022-10-17 DIAGNOSIS — E1165 Type 2 diabetes mellitus with hyperglycemia: Secondary | ICD-10-CM | POA: Diagnosis not present

## 2022-10-31 DIAGNOSIS — R0602 Shortness of breath: Secondary | ICD-10-CM | POA: Diagnosis not present

## 2022-11-07 ENCOUNTER — Telehealth (HOSPITAL_COMMUNITY): Payer: Self-pay | Admitting: *Deleted

## 2022-11-07 NOTE — Telephone Encounter (Signed)
Attempted to call patient regarding upcoming cardiac CT appointment. °Left message on voicemail with name and callback number ° °Abrham Maslowski RN Navigator Cardiac Imaging °[facility] °[phone] Office °[phone] Cell ° °

## 2022-11-08 ENCOUNTER — Other Ambulatory Visit (HOSPITAL_COMMUNITY): Payer: Self-pay | Admitting: *Deleted

## 2022-11-08 ENCOUNTER — Encounter (HOSPITAL_COMMUNITY): Payer: Self-pay

## 2022-11-08 ENCOUNTER — Telehealth (HOSPITAL_COMMUNITY): Payer: Self-pay | Admitting: *Deleted

## 2022-11-08 MED ORDER — IVABRADINE HCL 5 MG PO TABS: ORAL_TABLET | ORAL | 0 refills | Status: DC

## 2022-11-08 MED ORDER — METOPROLOL TARTRATE 100 MG PO TABS: ORAL_TABLET | ORAL | 0 refills | Status: DC

## 2022-11-08 NOTE — Telephone Encounter (Signed)
Reaching out to patient to offer assistance regarding upcoming cardiac imaging study; pt verbalizes understanding of appt date/time, parking situation and where to check in, pre-test NPO status and medications ordered, and verified current allergies; name and call back number provided for further questions should they arise  Hollister Wessler RN Navigator Cardiac Imaging [facility] [phone] office [phone] cell  Patient to take 100mg metoprolol tartrate and 10mg ivabradine two hours prior to his cardiac CT scan. 

## 2022-11-09 ENCOUNTER — Ambulatory Visit
Admission: RE | Admit: 2022-11-09 | Discharge: 2022-11-09 | Disposition: A | Discharge status: Home / Self Care | Payer: Medicare Other | Source: Ambulatory Visit | Attending: Internal Medicine | Admitting: Internal Medicine

## 2022-11-09 DIAGNOSIS — I2089 Other forms of angina pectoris: Secondary | ICD-10-CM | POA: Insufficient documentation

## 2022-11-09 DIAGNOSIS — R079 Chest pain, unspecified: Secondary | ICD-10-CM | POA: Insufficient documentation

## 2022-11-09 MED ORDER — NITROGLYCERIN 0.4 MG SL SUBL
0.8000 mg | SUBLINGUAL_TABLET | Freq: Once | SUBLINGUAL | Status: AC
  Administered 2022-11-09: 0.8 mg via SUBLINGUAL

## 2022-11-09 MED ORDER — IOHEXOL 350 MG/ML SOLN
100.0000 mL | Freq: Once | INTRAVENOUS | Status: AC | PRN
  Administered 2022-11-09: 100 mL via INTRAVENOUS

## 2022-11-09 NOTE — Progress Notes (Signed)
Pt tolerated procedure well. Pt ABCs intact. Pt denies any complaints. Pt encouraged to drink plenty of water throughout the day. Pt ambulatory with steady gait.  

## 2022-11-16 DIAGNOSIS — F17200 Nicotine dependence, unspecified, uncomplicated: Secondary | ICD-10-CM | POA: Diagnosis not present

## 2022-11-16 DIAGNOSIS — I2089 Other forms of angina pectoris: Secondary | ICD-10-CM | POA: Diagnosis not present

## 2022-11-16 DIAGNOSIS — E10649 Type 1 diabetes mellitus with hypoglycemia without coma: Secondary | ICD-10-CM | POA: Diagnosis not present

## 2022-11-16 DIAGNOSIS — E1040 Type 1 diabetes mellitus with diabetic neuropathy, unspecified: Secondary | ICD-10-CM | POA: Diagnosis not present

## 2022-11-16 DIAGNOSIS — I700 Atherosclerosis of aorta: Secondary | ICD-10-CM | POA: Diagnosis not present

## 2022-11-16 DIAGNOSIS — R0602 Shortness of breath: Secondary | ICD-10-CM | POA: Diagnosis not present

## 2022-11-16 DIAGNOSIS — E782 Mixed hyperlipidemia: Secondary | ICD-10-CM | POA: Diagnosis not present

## 2022-11-16 DIAGNOSIS — R0789 Other chest pain: Secondary | ICD-10-CM | POA: Diagnosis not present

## 2022-11-16 DIAGNOSIS — E1165 Type 2 diabetes mellitus with hyperglycemia: Secondary | ICD-10-CM | POA: Diagnosis not present

## 2022-11-16 DIAGNOSIS — J449 Chronic obstructive pulmonary disease, unspecified: Secondary | ICD-10-CM | POA: Diagnosis not present

## 2022-11-28 ENCOUNTER — Ambulatory Visit (INDEPENDENT_AMBULATORY_CARE_PROVIDER_SITE_OTHER): Payer: Medicare Other | Admitting: Internal Medicine

## 2022-11-28 ENCOUNTER — Encounter: Payer: Self-pay | Admitting: Internal Medicine

## 2022-11-28 VITALS — BP 124/78 | HR 86 | Ht 67.0 in | Wt 196.0 lb

## 2022-11-28 DIAGNOSIS — J449 Chronic obstructive pulmonary disease, unspecified: Secondary | ICD-10-CM | POA: Diagnosis not present

## 2022-11-28 DIAGNOSIS — I10 Essential (primary) hypertension: Secondary | ICD-10-CM

## 2022-11-28 NOTE — Progress Notes (Signed)
Date:  11/28/2022   Name:  Paul Stokes   DOB:  Oct 09, 1955   MRN:  [id number]   Chief Complaint: Hypertension  Hypertension This is a chronic problem. The problem is controlled. Associated symptoms include shortness of breath. Pertinent negatives include no chest pain, headaches or palpitations. Past treatments include beta blockers and angiotensin blockers.    Lab Results  Component Value Date   NA 139 05/30/2022   K 4.6 05/30/2022   CO2 25 05/30/2022   GLUCOSE 129 (H) 05/30/2022   BUN 25 05/30/2022   CREATININE 0.97 05/30/2022   CALCIUM 9.3 05/30/2022   EGFR 86 05/30/2022   GFRNONAA >60 05/03/2022   Lab Results  Component Value Date   CHOL 154 05/30/2022   HDL 77 05/30/2022   LDLCALC 62 05/30/2022   TRIG 80 05/30/2022   CHOLHDL 2.0 05/30/2022   Lab Results  Component Value Date   TSH 1.510 08/06/2020   Lab Results  Component Value Date   HGBA1C 7.8 10/04/2022   Lab Results  Component Value Date   WBC 5.6 05/03/2022   HGB 13.2 05/03/2022   HCT 41.1 05/03/2022   MCV 90.7 05/03/2022   PLT 286 05/03/2022   Lab Results  Component Value Date   ALT 23 05/30/2022   AST 7 05/30/2022   ALKPHOS 89 05/30/2022   BILITOT 0.7 05/30/2022   No results found for: "25OHVITD2", "25OHVITD3", "VD25OH"   Review of Systems  Constitutional:  Negative for fatigue and unexpected weight change.  HENT:  Negative for nosebleeds and trouble swallowing.   Eyes:  Negative for visual disturbance.  Respiratory:  Positive for shortness of breath. Negative for cough, chest tightness and wheezing.   Cardiovascular:  Negative for chest pain, palpitations and leg swelling.  Gastrointestinal:  Negative for abdominal pain, constipation and diarrhea.  Neurological:  Negative for dizziness, weakness, light-headedness and headaches.  Psychiatric/Behavioral:  Positive for sleep disturbance.     Patient Active Problem List   Diagnosis Date Noted   Essential hypertension 05/03/2022    Dyslipidemia 05/03/2022   Depression 05/03/2022   Chronic obstructive pulmonary disease (HCC) 05/02/2022   Benign essential tremor 07/26/2021   History of gross hematuria 01/05/2021   Personal history of colonic polyps    Aortic atherosclerosis (HCC) 08/27/2019   Dyspepsia 08/06/2018   Acute bilateral low back pain with right-sided sciatica 11/29/2017   Mood disorder (HCC) 11/15/2016   BPH with obstruction/lower urinary tract symptoms 06/15/2015   Hyperlipidemia due to type 1 diabetes mellitus (HCC) 06/15/2015   Insomnia, persistent 03/06/2015   Compulsive tobacco user syndrome 03/06/2015   Type 1 diabetes mellitus with sensory neuropathy (HCC) 03/06/2015   Presence of insulin pump 06/08/2014    Allergies  Allergen Reactions   Lisinopril Cough    Past Surgical History:  Procedure Laterality Date   CATARACT EXTRACTION, BILATERAL     COLONOSCOPY  08/14/2013   7 tubular adenomas   COLONOSCOPY WITH PROPOFOL N/A 09/10/2019   Procedure: COLONOSCOPY WITH PROPOFOL;  Surgeon: Midge Minium, MD;  Location: [facility];  Service: Endoscopy;  Laterality: N/A;   COLONOSCOPY WITH PROPOFOL N/A 09/11/2019   Procedure: COLONOSCOPY WITH PROPOFOL;  Surgeon: Wyline Mood, MD;  Location: [facility] ENDOSCOPY;  Service: Gastroenterology;  Laterality: N/A;   HOLEP-LASER ENUCLEATION OF THE PROSTATE WITH MORCELLATION N/A 04/02/2021   Procedure: HOLEP-LASER ENUCLEATION OF THE PROSTATE WITH MORCELLATION;  Surgeon: Sondra Come, MD;  Location: [facility] ORS;  Service: Urology;  Laterality: N/A;    Social History  Tobacco Use   Smoking status: Former    Current packs/day: 0.00    Average packs/day: 0.3 packs/day for 53.9 years (13.5 ttl pk-yrs)    Types: Cigarettes    Start date: 05/12/1968    Quit date: 04/13/2022    Years since quitting: 0.6    Passive exposure: Past   Smokeless tobacco: Never  Vaping Use   Vaping status: Never Used  Substance Use Topics   Alcohol use: No    Alcohol/week: 0.0  standard drinks of alcohol   Drug use: No     Medication list has been reviewed and updated.  Current Meds  Medication Sig   albuterol (PROVENTIL) (2.5 MG/3ML) 0.083% nebulizer solution TAKE 3 MLS BY NEBULIZATION EVERY 4 HOURSAS NEEDED FOR WHEEZING OR SHORTNESS OF BREATH   albuterol (VENTOLIN HFA) 108 (90 Base) MCG/ACT inhaler Inhale 2 puffs into the lungs every 4 (four) hours as needed for wheezing or shortness of breath.   aspirin EC 81 MG tablet Take 81 mg by mouth daily. Swallow whole.   BAYER CONTOUR NEXT TEST test strip    buPROPion (WELLBUTRIN SR) 150 MG 12 hr tablet TAKE 1 TABLET BY MOUTH EVERY MORNING AND2 TABLETS AT BEDTIME   Continuous Blood Gluc Sensor (DEXCOM G6 SENSOR) MISC by Does not apply route.   Cyanocobalamin 5000 MCG/ML LIQD Place under the tongue daily. Liquid   Fluticasone-Umeclidin-Vilant (TRELEGY ELLIPTA) 200-62.5-25 MCG/ACT AEPB Inhale into the lungs.   gabapentin (NEURONTIN) 300 MG capsule TAKE 2 CAPULES BY MOUTH THREE TIMES DAILY   HUMALOG 100 UNIT/ML injection Inject 80 Units into the skin See admin instructions. 80 units per day through insulin pump   losartan (COZAAR) 25 MG tablet Take 1 tablet (25 mg total) by mouth daily.   metoprolol tartrate (LOPRESSOR) 100 MG tablet Take tablet (100mg ) TWO hours prior to your cardiac CT scan.   pravastatin (PRAVACHOL) 40 MG tablet Take 1 tablet (40 mg total) by mouth daily.   predniSONE (DELTASONE) 10 MG tablet Take 10 mg by mouth daily with breakfast.   Spacer/Aero-Holding Chambers (AEROCHAMBER MV) inhaler Use as instructed   tadalafil (CIALIS) 5 MG tablet Take 1 tablet (5 mg total) by mouth daily as needed for erectile dysfunction.   VITAMIN D PO Take 5,000 Units by mouth daily.   zolpidem (AMBIEN) 10 MG tablet TAKE 1 TABLET BY MOUTH AT BEDTIME.   [DISCONTINUED] Budeson-Glycopyrrol-Formoterol (BREZTRI AEROSPHERE) 160-9-4.8 MCG/ACT AERO Inhale 2 puffs into the lungs 2 (two) times daily.   [DISCONTINUED] ivabradine  (CORLANOR) 5 MG TABS tablet Take tablets (10mg ) TWO hours prior to your cardiac CT scan.       11/28/2022    8:03 AM 05/30/2022    8:14 AM 05/20/2022   11:13 AM 04/19/2022   11:33 AM  GAD 7 : Generalized Anxiety Score  Nervous, Anxious, on Edge 2 2 1 1   Control/stop worrying 2 1 1 1   Worry too much - different things 2 1 1 1   Trouble relaxing 1 2 2 3   Restless 1 2 2 3   Easily annoyed or irritable 1 2 1 2   Afraid - awful might happen 0 1 0 0  Total GAD 7 Score 9 11 8 11   Anxiety Difficulty Not difficult at all Somewhat difficult  Extremely difficult       11/28/2022    8:03 AM 05/30/2022    8:14 AM 05/20/2022   11:13 AM  Depression screen PHQ 2/9  Decreased Interest 1 3 3   Down, Depressed, Hopeless 0  1 0  PHQ - 2 Score 1 4 3   Altered sleeping 2 1 1   Tired, decreased energy 2 3 3   Change in appetite 3 1 3   Feeling bad or failure about yourself  0 0 0  Trouble concentrating 1 1 1   Moving slowly or fidgety/restless 1 1   Suicidal thoughts 0 0 0  PHQ-9 Score 10 11 11   Difficult doing work/chores Not difficult at all Very difficult Not difficult at all    BP Readings from Last 3 Encounters:  11/28/22 124/78  11/09/22 108/67  05/30/22 122/68    Physical Exam Vitals and nursing note reviewed.  Constitutional:      General: He is not in acute distress.    Appearance: He is well-developed.  HENT:     Head: Normocephalic and atraumatic.  Neck:     Vascular: No carotid bruit.  Cardiovascular:     Rate and Rhythm: Normal rate and regular rhythm.  Pulmonary:     Effort: Pulmonary effort is normal. No respiratory distress.     Breath sounds: Normal breath sounds. No wheezing or rhonchi.  Musculoskeletal:     Cervical back: Normal range of motion.     Right lower leg: No edema.     Left lower leg: No edema.  Lymphadenopathy:     Cervical: No cervical adenopathy.  Skin:    General: Skin is warm and dry.     Findings: No rash.  Neurological:     General: No focal deficit  present.     Mental Status: He is alert and oriented to person, place, and time.  Psychiatric:        Mood and Affect: Mood normal.        Behavior: Behavior normal.     Wt Readings from Last 3 Encounters:  11/28/22 196 lb (88.9 kg)  09/27/22 192 lb (87.1 kg)  05/30/22 191 lb 12.8 oz (87 kg)    BP 124/78   Pulse 86   Ht 5\' 7"  (1.702 m)   Wt 196 lb (88.9 kg)   SpO2 97%   BMI 30.70 kg/m   Assessment and Plan:  Problem List Items Addressed This Visit     Essential hypertension - Primary    Normal exam with stable BP on losartan and metoprolol. No concerns or side effects to current medication. No change in regimen; continue low sodium diet.       Chronic obstructive pulmonary disease (HCC)    COPD symptoms stable, can walk slowly for long distances. On Trelegy daily. Using nebulizer only PRN      Relevant Medications   Fluticasone-Umeclidin-Vilant (TRELEGY ELLIPTA) 200-62.5-25 MCG/ACT AEPB    No follow-ups on file.    Reubin Milan, MD [facility] Primary Care and Sports Medicine [place]

## 2022-11-28 NOTE — Assessment & Plan Note (Signed)
Normal exam with stable BP on losartan and metoprolol. No concerns or side effects to current medication. No change in regimen; continue low sodium diet.

## 2022-11-28 NOTE — Assessment & Plan Note (Addendum)
COPD symptoms stable, can walk slowly for long distances. On Trelegy daily.  Has prednisone to take if needed. Using nebulizer only PRN

## 2022-12-17 DIAGNOSIS — E1165 Type 2 diabetes mellitus with hyperglycemia: Secondary | ICD-10-CM | POA: Diagnosis not present

## 2022-12-29 ENCOUNTER — Other Ambulatory Visit: Payer: Self-pay | Admitting: Internal Medicine

## 2022-12-29 DIAGNOSIS — E1040 Type 1 diabetes mellitus with diabetic neuropathy, unspecified: Secondary | ICD-10-CM

## 2022-12-30 NOTE — Telephone Encounter (Signed)
Requested Prescriptions  Refused Prescriptions Disp Refills   gabapentin (NEURONTIN) 300 MG capsule [Pharmacy Med Name: GABAPENTIN 300 MG CAP] 540 capsule 1    Sig: TAKE 2 CAPULES BY MOUTH THREE TIMES DAILY     Neurology: Anticonvulsants - gabapentin Passed - 12/29/2022  5:23 PM      Passed - Cr in normal range and within 360 days    Creatinine, Ser  Date Value Ref Range Status  05/30/2022 0.97 0.76 - 1.27 mg/dL Final         Passed - Completed PHQ-2 or PHQ-9 in the last 360 days      Passed - Valid encounter within last 12 months    Recent Outpatient Visits           1 month ago Essential hypertension   [place] [facility] at [facility], Nyoka Cowden, MD   7 months ago Annual physical exam   [facility] at [facility], Nyoka Cowden, MD   7 months ago COVID-19 virus infection   [facility] at [facility], Nyoka Cowden, MD   8 months ago Community acquired pneumonia of right middle lobe of lung   [facility] at [facility], Nyoka Cowden, MD   9 months ago Bronchitis   [facility] at [facility], Nyoka Cowden, MD       Future Appointments             In 5 months Judithann Graves, Nyoka Cowden, MD [facility] Medicine at [facility], [facility]

## 2023-01-11 ENCOUNTER — Ambulatory Visit (INDEPENDENT_AMBULATORY_CARE_PROVIDER_SITE_OTHER): Payer: Medicare Other

## 2023-01-11 VITALS — BP 120/68 | Ht 67.0 in | Wt 191.2 lb

## 2023-01-11 DIAGNOSIS — Z23 Encounter for immunization: Secondary | ICD-10-CM

## 2023-01-11 DIAGNOSIS — Z0000 Encounter for general adult medical examination without abnormal findings: Secondary | ICD-10-CM | POA: Diagnosis not present

## 2023-01-11 NOTE — Patient Instructions (Addendum)
Mr. Chiappa , Thank you for taking time to come for your Medicare Wellness Visit. I appreciate your ongoing commitment to your health goals. Please review the following plan we discussed and let me know if I can assist you in the future.   Referrals/Orders/Follow-Ups/Clinician Recommendations: got flu shot  This is a list of the screening recommended for you and due dates:  Health Maintenance  Topic Date Due   DTaP/Tdap/Td vaccine (1 - Tdap) Never done   Zoster (Shingles) Vaccine (1 of 2) 11/12/2004   COVID-19 Vaccine (5 - 2023-24 season) 01/14/2022   Eye exam for diabetics  01/28/2023   Hemoglobin A1C  04/06/2023   Yearly kidney function blood test for diabetes  05/31/2023   Yearly kidney health urinalysis for diabetes  05/31/2023   Complete foot exam   05/31/2023   Medicare Annual Wellness Visit  01/11/2024   Colon Cancer Screening  09/10/2024   Pneumonia Vaccine  Completed   Flu Shot  Completed   Hepatitis C Screening  Completed   HPV Vaccine  Aged Out    Advanced directives: (ACP Link)Information on Advanced Care Planning can be found at [facility] of [place] Advance Health Care Directives Advance Health Care Directives ([email or web address]   Next Medicare Annual Wellness Visit scheduled for next year: Yes    01/17/24 @ 1:00 pm in person

## 2023-01-11 NOTE — Progress Notes (Signed)
Subjective:   Paul Stokes is a 67 y.o. male who presents for Medicare Annual/Subsequent preventive examination.  Visit Complete: In person  Review of Systems     Cardiac Risk Factors include: advanced age (>19men, >31 women);diabetes mellitus;dyslipidemia;male gender;smoking/ tobacco exposure     Objective:    Today's Vitals   01/11/23 1340 01/11/23 1341  BP: 120/68   Weight: 191 lb 3.2 oz (86.7 kg)   Height: 5\' 7"  (1.702 m)   PainSc:  0-No pain   Body mass index is 29.95 kg/m.     01/11/2023    1:45 PM 05/02/2022    5:20 PM 04/02/2021    7:17 AM 03/30/2021    2:14 PM 12/30/2020    8:37 AM 10/03/2019   11:26 AM 09/29/2019    3:55 PM  Advanced Directives  Does Patient Have a Medical Advance Directive? No No No No No No No  Would patient like information on creating a medical advance directive? No - Patient declined No - Patient declined No - Patient declined No - Patient declined Yes (MAU/Ambulatory/Procedural Areas - Information given) No - Patient declined     Current Medications (verified) Outpatient Encounter Medications as of 01/11/2023  Medication Sig   albuterol (PROVENTIL) (2.5 MG/3ML) 0.083% nebulizer solution TAKE 3 MLS BY NEBULIZATION EVERY 4 HOURSAS NEEDED FOR WHEEZING OR SHORTNESS OF BREATH   albuterol (VENTOLIN HFA) 108 (90 Base) MCG/ACT inhaler Inhale 2 puffs into the lungs every 4 (four) hours as needed for wheezing or shortness of breath.   aspirin EC 81 MG tablet Take 81 mg by mouth daily. Swallow whole.   BAYER CONTOUR NEXT TEST test strip    buPROPion (WELLBUTRIN SR) 150 MG 12 hr tablet TAKE 1 TABLET BY MOUTH EVERY MORNING AND2 TABLETS AT BEDTIME   Continuous Blood Gluc Sensor (DEXCOM G6 SENSOR) MISC by Does not apply route.   Cyanocobalamin 5000 MCG/ML LIQD Place under the tongue daily. Liquid   Fluticasone-Umeclidin-Vilant (TRELEGY ELLIPTA) 200-62.5-25 MCG/ACT AEPB Inhale into the lungs.   gabapentin (NEURONTIN) 300 MG capsule TAKE 2 CAPULES BY  MOUTH THREE TIMES DAILY   HUMALOG 100 UNIT/ML injection Inject 80 Units into the skin See admin instructions. 80 units per day through insulin pump   losartan (COZAAR) 25 MG tablet Take 1 tablet (25 mg total) by mouth daily.   metoprolol tartrate (LOPRESSOR) 100 MG tablet Take tablet (100mg ) TWO hours prior to your cardiac CT scan.   pravastatin (PRAVACHOL) 40 MG tablet Take 1 tablet (40 mg total) by mouth daily.   Spacer/Aero-Holding Chambers (AEROCHAMBER MV) inhaler Use as instructed   tadalafil (CIALIS) 5 MG tablet Take 1 tablet (5 mg total) by mouth daily as needed for erectile dysfunction.   VITAMIN D PO Take 5,000 Units by mouth daily.   zolpidem (AMBIEN) 10 MG tablet TAKE 1 TABLET BY MOUTH AT BEDTIME.   predniSONE (DELTASONE) 10 MG tablet Take 10 mg by mouth daily with breakfast. (Patient not taking: Reported on 01/11/2023)   No facility-administered encounter medications on file as of 01/11/2023.    Allergies (verified) Lisinopril   History: Past Medical History:  Diagnosis Date   Depression    Diabetes mellitus without complication (HCC)    type 1; insulin pump   Emphysema of lung (HCC)    GERD (gastroesophageal reflux disease)    Hyperlipidemia    Prostate disease    Past Surgical History:  Procedure Laterality Date   CATARACT EXTRACTION, BILATERAL     COLONOSCOPY  08/14/2013  7 tubular adenomas   COLONOSCOPY WITH PROPOFOL N/A 09/10/2019   Procedure: COLONOSCOPY WITH PROPOFOL;  Surgeon: Midge Minium, MD;  Location: [facility] ENDOSCOPY;  Service: Endoscopy;  Laterality: N/A;   COLONOSCOPY WITH PROPOFOL N/A 09/11/2019   Procedure: COLONOSCOPY WITH PROPOFOL;  Surgeon: Wyline Mood, MD;  Location: [facility] ENDOSCOPY;  Service: Gastroenterology;  Laterality: N/A;   HOLEP-LASER ENUCLEATION OF THE PROSTATE WITH MORCELLATION N/A 04/02/2021   Procedure: HOLEP-LASER ENUCLEATION OF THE PROSTATE WITH MORCELLATION;  Surgeon: Sondra Come, MD;  Location: [facility] ORS;  Service: Urology;   Laterality: N/A;   Family History  Problem Relation Age of Onset   Diabetes Mother    Diabetes Father    Social History   Socioeconomic History   Marital status: Married    Spouse name: Lola   Number of children: 5   Years of education: Not on file   Highest education level: 10th grade  Occupational History   Occupation: part-time  Tobacco Use   Smoking status: Former    Current packs/day: 0.00    Average packs/day: 0.3 packs/day for 53.9 years (13.5 ttl pk-yrs)    Types: Cigarettes    Start date: 05/12/1968    Quit date: 04/13/2022    Years since quitting: 0.7    Passive exposure: Past   Smokeless tobacco: Never  Vaping Use   Vaping status: Never Used  Substance and Sexual Activity   Alcohol use: No    Alcohol/week: 0.0 standard drinks of alcohol   Drug use: No   Sexual activity: Not Currently  Other Topics Concern   Not on file  Social History Narrative   Not on file   Social Determinants of Health   Financial Resource Strain: Low Risk  (01/11/2023)   Overall Financial Resource Strain (CARDIA)    Difficulty of Paying Living Expenses: Not hard at all  Food Insecurity: No Food Insecurity (01/11/2023)   Hunger Vital Sign    Worried About Running Out of Food in the Last Year: Never true    Ran Out of Food in the Last Year: Never true  Transportation Needs: No Transportation Needs (01/11/2023)   PRAPARE - Administrator, Civil Service (Medical): No    Lack of Transportation (Non-Medical): No  Physical Activity: Sufficiently Active (01/11/2023)   Exercise Vital Sign    Days of Exercise per Week: 3 days    Minutes of Exercise per Session: 60 min  Stress: No Stress Concern Present (01/11/2023)   [employer] of Occupational Health - Occupational Stress Questionnaire    Feeling of Stress : Not at all  Social Connections: Moderately Isolated (01/11/2023)   Social Connection and Isolation Panel [NHANES]    Frequency of Communication with Friends and  Family: More than three times a week    Frequency of Social Gatherings with Friends and Family: Once a week    Attends Religious Services: Never    Database administrator or Organizations: No    Attends Engineer, structural: Never    Marital Status: Married    Tobacco Counseling Counseling given: Not Answered   Clinical Intake:  Pre-visit preparation completed: Yes  Pain : No/denies pain Pain Score: 0-No pain     Nutritional Risks: None Diabetes: Yes CBG done?: No Did pt. bring in CBG monitor from home?: No  How often do you need to have someone help you when you read instructions, pamphlets, or other written materials from your doctor or pharmacy?: 1 - Never  Interpreter Needed?:  No  Information entered by :: Kennedy Bucker, LPN   Activities of Daily Living    01/11/2023    1:45 PM  In your present state of health, do you have any difficulty performing the following activities:  Hearing? 0  Vision? 0  Difficulty concentrating or making decisions? 0  Walking or climbing stairs? 0  Dressing or bathing? 0  Doing errands, shopping? 0  Preparing Food and eating ? N  Using the Toilet? N  In the past six months, have you accidently leaked urine? N  Do you have problems with loss of bowel control? N  Managing your Medications? N  Managing your Finances? N  Housekeeping or managing your Housekeeping? N    Patient Care Team: Reubin Milan, MD as PCP - General (Internal Medicine) Tedd Sias Marlana Salvage, MD as Physician Assistant (Endocrinology) Domingo Madeira, [place] (Optometry) Sondra Come, MD as Consulting Physician (Urology)  Indicate any recent Medical Services you may have received from other than [facility] providers in the past year (date may be approximate).     Assessment:   This is a routine wellness examination for [place].  Hearing/Vision screen Hearing Screening - Comments:: No aids Vision Screening - Comments:: Wears glasses all the time-  [facility]  Dietary issues and exercise activities discussed:     Goals Addressed             This Visit's Progress    DIET - EAT MORE FRUITS AND VEGETABLES         Depression Screen    01/11/2023    1:43 PM 11/28/2022    8:03 AM 05/30/2022    8:14 AM 05/20/2022   11:13 AM 04/19/2022   11:33 AM 03/09/2022    9:06 AM 01/07/2022    8:54 AM  PHQ 2/9 Scores  PHQ - 2 Score 0 1 4 3 3 1  0  PHQ- 9 Score 0 10 11 11 14 6 2     Fall Risk    01/11/2023    1:45 PM 11/28/2022    8:03 AM 05/30/2022    8:14 AM 05/20/2022   11:14 AM 04/19/2022   11:33 AM  Fall Risk   Falls in the past year? 0 0 0 0 0  Number falls in past yr: 0 0 0 0 0  Injury with Fall? 0 0 0 0 0  Risk for fall due to : No Fall Risks No Fall Risks History of fall(s) No Fall Risks No Fall Risks  Follow up Falls prevention discussed;Falls evaluation completed Falls evaluation completed Falls evaluation completed Falls evaluation completed Falls evaluation completed    MEDICARE RISK AT HOME: Medicare Risk at Home Any stairs in or around the home?: Yes If so, are there any without handrails?: No Home free of loose throw rugs in walkways, pet beds, electrical cords, etc?: Yes Adequate lighting in your home to reduce risk of falls?: Yes Life alert?: No Use of a cane, walker or w/c?: Yes (cane, if legs weak) Grab bars in the bathroom?: Yes Shower chair or bench in shower?: No Elevated toilet seat or a handicapped toilet?: Yes  TIMED UP AND GO:  Was the test performed?  Yes  Length of time to ambulate 10 feet: 4 sec Gait steady and fast without use of assistive device    Cognitive Function:        01/11/2023    1:47 PM 01/07/2022    8:42 AM 12/30/2020    8:44 AM 08/06/2018  9:01 AM  6CIT Screen  What Year? 0 points 0 points 0 points 0 points  What month? 0 points 0 points 0 points 0 points  What time? 0 points 0 points 0 points 0 points  Count back from 20 0 points 0 points 0 points 0 points  Months in reverse  0 points 0 points 0 points 0 points  Repeat phrase 2 points 0 points 0 points 0 points  Total Score 2 points 0 points 0 points 0 points    Immunizations Immunization History  Administered Date(s) Administered   Fluad Trivalent(High Dose 65+) 01/11/2023   Influenza Inj Mdck Quad Pf 03/12/2018, 02/22/2019   Influenza Split 03/05/2020   Influenza,inj,Quad PF,6+ Mos 03/12/2018   Influenza-Unspecified 03/15/2015, 03/06/2019, 05/27/2021   PFIZER Comirnaty(Gray Top)Covid-19 Tri-Sucrose Vaccine 07/30/2019, 08/20/2019   PFIZER(Purple Top)SARS-COV-2 Vaccination 07/30/2019, 08/20/2019   PNEUMOCOCCAL CONJUGATE-20 05/27/2022   Pneumococcal Polysaccharide-23 05/17/2010, 03/05/2020   Respiratory Syncytial Virus Vaccine,Recomb Aduvanted(Arexvy) 06/29/2022   Zoster, Live 05/23/2013    TDAP status: Due, Education has been provided regarding the importance of this vaccine. Advised may receive this vaccine at local pharmacy or Health Dept. Aware to provide a copy of the vaccination record if obtained from local pharmacy or Health Dept. Verbalized acceptance and understanding.  Flu Vaccine status: Completed at today's visit  Pneumococcal vaccine status: Up to date  Covid-19 vaccine status: Completed vaccines  Qualifies for Shingles Vaccine? Yes   Zostavax completed Yes   Shingrix Completed?: No.    Education has been provided regarding the importance of this vaccine. Patient has been advised to call insurance company to determine out of pocket expense if they have not yet received this vaccine. Advised may also receive vaccine at local pharmacy or Health Dept. Verbalized acceptance and understanding.  Screening Tests Health Maintenance  Topic Date Due   DTaP/Tdap/Td (1 - Tdap) Never done   Zoster Vaccines- Shingrix (1 of 2) 11/12/2004   COVID-19 Vaccine (5 - 2023-24 season) 01/14/2022   OPHTHALMOLOGY EXAM  01/28/2023   HEMOGLOBIN A1C  04/06/2023   Diabetic kidney evaluation - eGFR measurement   05/31/2023   Diabetic kidney evaluation - Urine ACR  05/31/2023   FOOT EXAM  05/31/2023   Medicare Annual Wellness (AWV)  01/11/2024   Colonoscopy  09/10/2024   Pneumonia Vaccine 37+ Years old  Completed   INFLUENZA VACCINE  Completed   Hepatitis C Screening  Completed   HPV VACCINES  Aged Out    Health Maintenance  Health Maintenance Due  Topic Date Due   DTaP/Tdap/Td (1 - Tdap) Never done   Zoster Vaccines- Shingrix (1 of 2) 11/12/2004   COVID-19 Vaccine (5 - 2023-24 season) 01/14/2022    Colorectal cancer screening: Type of screening: Colonoscopy. Completed 09/11/19. Repeat every 5 years  Lung Cancer Screening: (Low Dose CT Chest recommended if Age 50-80 years, 20 pack-year currently smoking OR have quit w/in 15years.) does not qualify.   Additional Screening:  Hepatitis C Screening: does qualify; Completed 06/15/15  Vision Screening: Recommended annual ophthalmology exams for early detection of glaucoma and other disorders of the eye. Is the patient up to date with their annual eye exam?  Yes  Who is the provider or what is the name of the office in which the patient attends annual eye exams? [facility] If pt is not established with a provider, would they like to be referred to a provider to establish care? No .   Dental Screening: Recommended annual dental exams for proper oral hygiene  Diabetic Foot Exam: Diabetic Foot Exam: Completed 05/30/22  Community Resource Referral / Chronic Care Management: CRR required this visit?  No   CCM required this visit?  No     Plan:     I have personally reviewed and noted the following in the patient's chart:   Medical and social history Use of alcohol, tobacco or illicit drugs  Current medications and supplements including opioid prescriptions. Patient is not currently taking opioid prescriptions. Functional ability and status Nutritional status Physical activity Advanced directives List of other  physicians Hospitalizations, surgeries, and ER visits in previous 12 months Vitals Screenings to include cognitive, depression, and falls Referrals and appointments  In addition, I have reviewed and discussed with patient certain preventive protocols, quality metrics, and best practice recommendations. A written personalized care plan for preventive services as well as general preventive health recommendations were provided to patient.     Hal Hope, LPN   [id number]   After Visit Summary: my chart  Nurse Notes: none

## 2023-01-12 ENCOUNTER — Other Ambulatory Visit: Payer: Self-pay | Admitting: Internal Medicine

## 2023-01-12 DIAGNOSIS — R809 Proteinuria, unspecified: Secondary | ICD-10-CM

## 2023-01-17 DIAGNOSIS — E1040 Type 1 diabetes mellitus with diabetic neuropathy, unspecified: Secondary | ICD-10-CM | POA: Diagnosis not present

## 2023-01-17 LAB — HEPATIC FUNCTION PANEL
ALT: 19 U/L (ref 10–40)
AST: 15 (ref 14–40)
Alkaline Phosphatase: 87 (ref 25–125)

## 2023-01-17 LAB — BASIC METABOLIC PANEL
BUN: 14 (ref 4–21)
Creatinine: 1 (ref 0.6–1.3)
Glucose: 76
Potassium: 4.4 meq/L (ref 3.5–5.1)
Sodium: 142 (ref 137–147)

## 2023-01-17 LAB — COMPREHENSIVE METABOLIC PANEL
Albumin: 4.1 (ref 3.5–5.0)
Calcium: 9.6 (ref 8.7–10.7)
eGFR: 82

## 2023-01-17 LAB — TSH: TSH: 2.69 (ref 0.41–5.90)

## 2023-01-17 LAB — HEMOGLOBIN A1C: Hemoglobin A1C: 7.6

## 2023-01-24 DIAGNOSIS — E782 Mixed hyperlipidemia: Secondary | ICD-10-CM | POA: Diagnosis not present

## 2023-01-24 DIAGNOSIS — Z9641 Presence of insulin pump (external) (internal): Secondary | ICD-10-CM | POA: Diagnosis not present

## 2023-01-24 DIAGNOSIS — E1059 Type 1 diabetes mellitus with other circulatory complications: Secondary | ICD-10-CM | POA: Diagnosis not present

## 2023-01-24 DIAGNOSIS — E1040 Type 1 diabetes mellitus with diabetic neuropathy, unspecified: Secondary | ICD-10-CM | POA: Diagnosis not present

## 2023-01-24 DIAGNOSIS — E10649 Type 1 diabetes mellitus with hypoglycemia without coma: Secondary | ICD-10-CM | POA: Diagnosis not present

## 2023-02-08 DIAGNOSIS — E1065 Type 1 diabetes mellitus with hyperglycemia: Secondary | ICD-10-CM | POA: Diagnosis not present

## 2023-02-25 ENCOUNTER — Other Ambulatory Visit: Payer: Self-pay | Admitting: Internal Medicine

## 2023-02-25 ENCOUNTER — Encounter: Payer: Self-pay | Admitting: Internal Medicine

## 2023-02-25 DIAGNOSIS — G4700 Insomnia, unspecified: Secondary | ICD-10-CM

## 2023-02-27 NOTE — Telephone Encounter (Signed)
Please review.  KP

## 2023-04-17 ENCOUNTER — Other Ambulatory Visit: Payer: Self-pay | Admitting: Internal Medicine

## 2023-04-17 DIAGNOSIS — R809 Proteinuria, unspecified: Secondary | ICD-10-CM

## 2023-04-17 DIAGNOSIS — E1040 Type 1 diabetes mellitus with diabetic neuropathy, unspecified: Secondary | ICD-10-CM

## 2023-04-17 DIAGNOSIS — Z87891 Personal history of nicotine dependence: Secondary | ICD-10-CM | POA: Diagnosis not present

## 2023-04-17 DIAGNOSIS — E1065 Type 1 diabetes mellitus with hyperglycemia: Secondary | ICD-10-CM | POA: Diagnosis not present

## 2023-04-17 DIAGNOSIS — J449 Chronic obstructive pulmonary disease, unspecified: Secondary | ICD-10-CM | POA: Diagnosis not present

## 2023-04-20 NOTE — Telephone Encounter (Signed)
Requested Prescriptions  Pending Prescriptions Disp Refills   losartan (COZAAR) 25 MG tablet [Pharmacy Med Name: LOSARTAN POTASSIUM 25 MG TAB] 90 tablet 1    Sig: TAKE ONE TABLET (25 MG) BY MOUTH EVERY DAY     Cardiovascular:  Angiotensin Receptor Blockers Failed - 04/17/2023  1:01 PM      Failed - Cr in normal range and within 180 days    Creatinine, Ser  Date Value Ref Range Status  05/30/2022 0.97 0.76 - 1.27 mg/dL Final         Failed - K in normal range and within 180 days    Potassium  Date Value Ref Range Status  05/30/2022 4.6 3.5 - 5.2 mmol/L Final         Passed - Patient is not pregnant      Passed - Last BP in normal range    BP Readings from Last 1 Encounters:  01/11/23 120/68         Passed - Valid encounter within last 6 months    Recent Outpatient Visits           4 months ago Essential hypertension   [place] [facility] at [facility], Nyoka Cowden, MD   10 months ago Annual physical exam   [facility] at [facility], Nyoka Cowden, MD   11 months ago COVID-19 virus infection   [facility] at [facility], Nyoka Cowden, MD   1 year ago Community acquired pneumonia of right middle lobe of lung   [place] [facility] at [facility], Nyoka Cowden, MD   1 year ago Bronchitis   [place] [facility] at [facility], Nyoka Cowden, MD       Future Appointments             In 1 month Judithann Graves, Nyoka Cowden, MD [facility] at [facility], [facility]             gabapentin (NEURONTIN) 300 MG capsule [Pharmacy Med Name: GABAPENTIN 300 MG CAP] 540 capsule 1    Sig: TAKE 2 CAPULES BY MOUTH THREE TIMES DAILY     Neurology: Anticonvulsants - gabapentin Passed - 04/17/2023  1:01 PM      Passed - Cr in normal range and within 360 days    Creatinine, Ser  Date Value Ref  Range Status  05/30/2022 0.97 0.76 - 1.27 mg/dL Final         Passed - Completed PHQ-2 or PHQ-9 in the last 360 days      Passed - Valid encounter within last 12 months    Recent Outpatient Visits           4 months ago Essential hypertension   [place] [facility] at [facility], Nyoka Cowden, MD   10 months ago Annual physical exam   [facility] at [facility], Nyoka Cowden, MD   11 months ago COVID-19 virus infection   [facility] at [facility], Nyoka Cowden, MD   1 year ago Community acquired pneumonia of right middle lobe of lung   [facility] at [facility], Nyoka Cowden, MD   1 year ago Bronchitis   [place] [facility] at [facility], Nyoka Cowden, MD       Future Appointments             In 1 month Judithann Graves, Nyoka Cowden, MD [facility] Medicine at [facility], [facility]

## 2023-04-25 DIAGNOSIS — Z4681 Encounter for fitting and adjustment of insulin pump: Secondary | ICD-10-CM | POA: Diagnosis not present

## 2023-04-25 DIAGNOSIS — E10649 Type 1 diabetes mellitus with hypoglycemia without coma: Secondary | ICD-10-CM | POA: Diagnosis not present

## 2023-04-25 DIAGNOSIS — E782 Mixed hyperlipidemia: Secondary | ICD-10-CM | POA: Diagnosis not present

## 2023-04-25 DIAGNOSIS — E1059 Type 1 diabetes mellitus with other circulatory complications: Secondary | ICD-10-CM | POA: Diagnosis not present

## 2023-04-25 DIAGNOSIS — E1040 Type 1 diabetes mellitus with diabetic neuropathy, unspecified: Secondary | ICD-10-CM | POA: Diagnosis not present

## 2023-05-29 ENCOUNTER — Ambulatory Visit
Admission: RE | Admit: 2023-05-29 | Discharge: 2023-05-29 | Disposition: A | Discharge status: Home / Self Care | Payer: Medicare HMO | Attending: Internal Medicine | Admitting: Internal Medicine

## 2023-05-29 ENCOUNTER — Ambulatory Visit
Admission: RE | Admit: 2023-05-29 | Discharge: 2023-05-29 | Disposition: A | Discharge status: Home / Self Care | Payer: Medicare HMO | Source: Ambulatory Visit | Attending: Internal Medicine | Admitting: Internal Medicine

## 2023-05-29 ENCOUNTER — Encounter: Payer: Self-pay | Admitting: Internal Medicine

## 2023-05-29 ENCOUNTER — Ambulatory Visit (INDEPENDENT_AMBULATORY_CARE_PROVIDER_SITE_OTHER): Payer: Medicare HMO | Admitting: Internal Medicine

## 2023-05-29 VITALS — BP 128/72 | HR 67 | Ht 67.0 in | Wt 197.0 lb

## 2023-05-29 DIAGNOSIS — M25551 Pain in right hip: Secondary | ICD-10-CM

## 2023-05-29 DIAGNOSIS — M545 Low back pain, unspecified: Secondary | ICD-10-CM | POA: Diagnosis not present

## 2023-05-29 NOTE — Progress Notes (Signed)
 Date:  05/29/2023   Name:  Paul Stokes   DOB:  Jan 02, 1956   MRN:  [id number]   Chief Complaint: Hip Pain (Right hip. Patient said this has been going on for months now. Has discussed at a previous visit, but pain is getting worse. Pain when walking, and trying to bend leg. Patient fell a month ago.)  Hip Pain  There was no injury mechanism. The pain is present in the right hip. The quality of the pain is described as aching. The pain is moderate. The pain has been Worsening since onset. Associated symptoms include an inability to bear weight (hard to get up from sitting) and a loss of motion. Pertinent negatives include no muscle weakness, numbness or tingling. The symptoms are aggravated by movement. He has tried NSAIDs for the symptoms. The treatment provided mild relief.    Review of Systems  Constitutional:  Positive for unexpected weight change. Negative for chills and fatigue.  HENT:  Negative for nosebleeds.   Eyes:  Negative for visual disturbance.  Respiratory:  Negative for cough, chest tightness, shortness of breath and wheezing.   Cardiovascular:  Negative for chest pain, palpitations and leg swelling.  Gastrointestinal:  Negative for abdominal pain, constipation and diarrhea.  Musculoskeletal:  Positive for arthralgias and gait problem.  Neurological:  Negative for dizziness, tingling, weakness, light-headedness, numbness and headaches.  Psychiatric/Behavioral:  Negative for dysphoric mood. The patient is not nervous/anxious.      Lab Results  Component Value Date   NA 142 01/17/2023   K 4.4 01/17/2023   CO2 25 05/30/2022   GLUCOSE 129 (H) 05/30/2022   BUN 14 01/17/2023   CREATININE 1.0 01/17/2023   CALCIUM 9.6 01/17/2023   EGFR 82 01/17/2023   GFRNONAA >60 05/03/2022   Lab Results  Component Value Date   CHOL 154 05/30/2022   HDL 77 05/30/2022   LDLCALC 62 05/30/2022   TRIG 80 05/30/2022   CHOLHDL 2.0 05/30/2022   Lab Results  Component Value Date   TSH  2.69 01/17/2023   Lab Results  Component Value Date   HGBA1C 7.6 01/17/2023   Lab Results  Component Value Date   WBC 5.6 05/03/2022   HGB 13.2 05/03/2022   HCT 41.1 05/03/2022   MCV 90.7 05/03/2022   PLT 286 05/03/2022   Lab Results  Component Value Date   ALT 19 01/17/2023   AST 15 01/17/2023   ALKPHOS 87 01/17/2023   BILITOT 0.7 05/30/2022   No results found for: MARIEN BOLLS, VD25OH   Patient Active Problem List   Diagnosis Date Noted   Essential hypertension 05/03/2022   Dyslipidemia 05/03/2022   Depression 05/03/2022   Chronic obstructive pulmonary disease (HCC) 05/02/2022   Benign essential tremor 07/26/2021   History of gross hematuria 01/05/2021   History of colonic polyps    Aortic atherosclerosis (HCC) 08/27/2019   Dyspepsia 08/06/2018   Mood disorder (HCC) 11/15/2016   BPH with obstruction/lower urinary tract symptoms 06/15/2015   Hyperlipidemia due to type 1 diabetes mellitus (HCC) 06/15/2015   Insomnia, persistent 03/06/2015   Compulsive tobacco user syndrome 03/06/2015   Type 1 diabetes mellitus with sensory neuropathy (HCC) 03/06/2015   Presence of insulin  pump 06/08/2014    Allergies  Allergen Reactions   Lisinopril Cough    Past Surgical History:  Procedure Laterality Date   CATARACT EXTRACTION, BILATERAL     COLONOSCOPY  08/14/2013   7 tubular adenomas   COLONOSCOPY WITH PROPOFOL  N/A 09/10/2019  Procedure: COLONOSCOPY WITH PROPOFOL ;  Surgeon: Jinny Carmine, MD;  Location: [facility] ENDOSCOPY;  Service: Endoscopy;  Laterality: N/A;   COLONOSCOPY WITH PROPOFOL  N/A 09/11/2019   Procedure: COLONOSCOPY WITH PROPOFOL ;  Surgeon: Therisa Bi, MD;  Location: [facility] ENDOSCOPY;  Service: Gastroenterology;  Laterality: N/A;   HOLEP-LASER ENUCLEATION OF THE PROSTATE WITH MORCELLATION N/A 04/02/2021   Procedure: HOLEP-LASER ENUCLEATION OF THE PROSTATE WITH MORCELLATION;  Surgeon: Francisca Redell BROCKS, MD;  Location: [facility] ORS;  Service: Urology;   Laterality: N/A;    Social History   Tobacco Use   Smoking status: Former    Current packs/day: 0.00    Average packs/day: 0.3 packs/day for 53.9 years (13.5 ttl pk-yrs)    Types: Cigarettes    Start date: 05/12/1968    Quit date: 04/13/2022    Years since quitting: 1.1    Passive exposure: Past   Smokeless tobacco: Never  Vaping Use   Vaping status: Never Used  Substance Use Topics   Alcohol use: No    Alcohol/week: 0.0 standard drinks of alcohol   Drug use: No     Medication list has been reviewed and updated.  Current Meds  Medication Sig   albuterol  (PROVENTIL ) (2.5 MG/3ML) 0.083% nebulizer solution TAKE 3 MLS BY NEBULIZATION EVERY 4 HOURSAS NEEDED FOR WHEEZING OR SHORTNESS OF BREATH   albuterol  (VENTOLIN  HFA) 108 (90 Base) MCG/ACT inhaler Inhale 2 puffs into the lungs every 4 (four) hours as needed for wheezing or shortness of breath.   aspirin  EC 81 MG tablet Take 81 mg by mouth daily. Swallow whole.   BAYER CONTOUR NEXT TEST test strip    buPROPion  (WELLBUTRIN  SR) 150 MG 12 hr tablet TAKE 1 TABLET BY MOUTH EVERY MORNING AND2 TABLETS AT BEDTIME   Continuous Blood Gluc Sensor (DEXCOM G6 SENSOR) MISC by Does not apply route.   Cyanocobalamin  5000 MCG/ML LIQD Place under the tongue daily. Liquid   Fluticasone-Umeclidin-Vilant (TRELEGY ELLIPTA) 200-62.5-25 MCG/ACT AEPB Inhale into the lungs.   gabapentin  (NEURONTIN ) 300 MG capsule TAKE 2 CAPULES BY MOUTH THREE TIMES DAILY   HUMALOG 100 UNIT/ML injection Inject 80 Units into the skin See admin instructions. 80 units per day through insulin  pump   losartan  (COZAAR ) 25 MG tablet TAKE ONE TABLET (25 MG) BY MOUTH EVERY DAY   pravastatin  (PRAVACHOL ) 40 MG tablet Take 1 tablet (40 mg total) by mouth daily.   predniSONE  (DELTASONE ) 10 MG tablet Take 10 mg by mouth daily with breakfast.   Spacer/Aero-Holding Chambers (AEROCHAMBER MV) inhaler Use as instructed   VITAMIN D  PO Take 5,000 Units by mouth daily.   zolpidem  (AMBIEN ) 10  MG tablet TAKE 1 TABLET BY MOUTH NIGHTLY   [DISCONTINUED] metoprolol  tartrate (LOPRESSOR ) 100 MG tablet Take tablet (100mg ) TWO hours prior to your cardiac CT scan.       05/29/2023   10:11 AM 11/28/2022    8:03 AM 05/30/2022    8:14 AM 05/20/2022   11:13 AM  GAD 7 : Generalized Anxiety Score  Nervous, Anxious, on Edge 0 2 2 1   Control/stop worrying 0 2 1 1   Worry too much - different things 0 2 1 1   Trouble relaxing 1 1 2 2   Restless 0 1 2 2   Easily annoyed or irritable 2 1 2 1   Afraid - awful might happen 0 0 1 0  Total GAD 7 Score 3 9 11 8   Anxiety Difficulty Not difficult at all Not difficult at all Somewhat difficult  05/29/2023   10:11 AM 01/11/2023    1:43 PM 11/28/2022    8:03 AM  Depression screen PHQ 2/9  Decreased Interest 0 0 1  Down, Depressed, Hopeless 0 0 0  PHQ - 2 Score 0 0 1  Altered sleeping 0 0 2  Tired, decreased energy 2 0 2  Change in appetite 0 0 3  Feeling bad or failure about yourself  0 0 0  Trouble concentrating 1 0 1  Moving slowly or fidgety/restless 1 0 1  Suicidal thoughts 0 0 0  PHQ-9 Score 4 0 10  Difficult doing work/chores Not difficult at all Not difficult at all Not difficult at all    BP Readings from Last 3 Encounters:  05/29/23 128/72  01/11/23 120/68  11/28/22 124/78    Physical Exam Vitals and nursing note reviewed.  Constitutional:      General: He is not in acute distress.    Appearance: Normal appearance. He is well-developed.  HENT:     Head: Normocephalic and atraumatic.  Cardiovascular:     Rate and Rhythm: Normal rate and regular rhythm.  Pulmonary:     Effort: Pulmonary effort is normal. No respiratory distress.     Breath sounds: No wheezing or rhonchi.  Musculoskeletal:     Cervical back: Normal range of motion.     Lumbar back: Negative right straight leg raise test and negative left straight leg raise test.     Right hip: Bony tenderness present. Decreased range of motion.     Left hip: Normal.      Right lower leg: No edema.     Left lower leg: No edema.  Skin:    General: Skin is warm and dry.     Capillary Refill: Capillary refill takes less than 2 seconds.     Findings: No rash.  Neurological:     Mental Status: He is alert and oriented to person, place, and time.  Psychiatric:        Mood and Affect: Mood normal.        Behavior: Behavior normal.     Wt Readings from Last 3 Encounters:  05/29/23 197 lb (89.4 kg)  01/11/23 191 lb 3.2 oz (86.7 kg)  11/28/22 196 lb (88.9 kg)    BP 128/72   Pulse 67   Ht 5' 7 (1.702 m)   Wt 197 lb (89.4 kg)   SpO2 96%   BMI 30.85 kg/m   Assessment and Plan:  Problem List Items Addressed This Visit   None Visit Diagnoses       Right hip pain    -  Primary   continue ibuprofen 800 mg bid will get imaging and refer as appropriate   Relevant Orders   DG Hip Unilat W OR W/O Pelvis 2-3 Views Right       No follow-ups on file.    Leita HILARIO Adie, MD [facility] [place]

## 2023-05-30 ENCOUNTER — Telehealth: Payer: Self-pay | Admitting: *Deleted

## 2023-05-30 NOTE — Telephone Encounter (Signed)
 Pt requesting x ray  results from yesterday . No documentation noted from PCP at this time. Reviewed with patient after PCP review he would be notified. Please advise

## 2023-05-31 DIAGNOSIS — E782 Mixed hyperlipidemia: Secondary | ICD-10-CM | POA: Diagnosis not present

## 2023-05-31 DIAGNOSIS — E1040 Type 1 diabetes mellitus with diabetic neuropathy, unspecified: Secondary | ICD-10-CM | POA: Diagnosis not present

## 2023-05-31 DIAGNOSIS — Z9641 Presence of insulin pump (external) (internal): Secondary | ICD-10-CM | POA: Diagnosis not present

## 2023-05-31 DIAGNOSIS — E1059 Type 1 diabetes mellitus with other circulatory complications: Secondary | ICD-10-CM | POA: Diagnosis not present

## 2023-05-31 DIAGNOSIS — E10649 Type 1 diabetes mellitus with hypoglycemia without coma: Secondary | ICD-10-CM | POA: Diagnosis not present

## 2023-06-02 ENCOUNTER — Ambulatory Visit (INDEPENDENT_AMBULATORY_CARE_PROVIDER_SITE_OTHER): Payer: Medicare HMO | Admitting: Internal Medicine

## 2023-06-02 ENCOUNTER — Encounter: Payer: Self-pay | Admitting: Internal Medicine

## 2023-06-02 VITALS — BP 122/78 | HR 69 | Ht 67.0 in | Wt 194.0 lb

## 2023-06-02 DIAGNOSIS — Z794 Long term (current) use of insulin: Secondary | ICD-10-CM

## 2023-06-02 DIAGNOSIS — F324 Major depressive disorder, single episode, in partial remission: Secondary | ICD-10-CM | POA: Diagnosis not present

## 2023-06-02 DIAGNOSIS — E1069 Type 1 diabetes mellitus with other specified complication: Secondary | ICD-10-CM | POA: Diagnosis not present

## 2023-06-02 DIAGNOSIS — E1040 Type 1 diabetes mellitus with diabetic neuropathy, unspecified: Secondary | ICD-10-CM | POA: Diagnosis not present

## 2023-06-02 DIAGNOSIS — I700 Atherosclerosis of aorta: Secondary | ICD-10-CM | POA: Diagnosis not present

## 2023-06-02 DIAGNOSIS — I10 Essential (primary) hypertension: Secondary | ICD-10-CM

## 2023-06-02 DIAGNOSIS — J449 Chronic obstructive pulmonary disease, unspecified: Secondary | ICD-10-CM | POA: Diagnosis not present

## 2023-06-02 DIAGNOSIS — Z0000 Encounter for general adult medical examination without abnormal findings: Secondary | ICD-10-CM | POA: Diagnosis not present

## 2023-06-02 DIAGNOSIS — Z125 Encounter for screening for malignant neoplasm of prostate: Secondary | ICD-10-CM

## 2023-06-02 DIAGNOSIS — E785 Hyperlipidemia, unspecified: Secondary | ICD-10-CM

## 2023-06-02 MED ORDER — PRAVASTATIN SODIUM 40 MG PO TABS: 40.0000 mg | ORAL_TABLET | Freq: Every day | ORAL | 3 refills | Status: DC

## 2023-06-02 NOTE — Assessment & Plan Note (Signed)
LDL is  Lab Results  Component Value Date   LDLCALC 62 05/30/2022   Current regimen is pravachol.  Tolerating medications well without issues.

## 2023-06-02 NOTE — Assessment & Plan Note (Signed)
COPD symptoms stable. On Trelegy daily.  He has prednisone to take prn. Does not use nebulizer daily - only PRN

## 2023-06-02 NOTE — Assessment & Plan Note (Signed)
 Controlled BP with normal exam. Current regimen is Losartan. Will continue same medications; encourage continued reduced sodium diet.

## 2023-06-02 NOTE — Assessment & Plan Note (Addendum)
Followed by Endo On insulin pump.  Last A1C 7.6 Starting Ozempic soon to help with A1C and weight

## 2023-06-02 NOTE — Progress Notes (Addendum)
Date:  06/02/2023   Name:  Paul Stokes   DOB:  05/23/1955   MRN:  [id number]   Chief Complaint: Establish Care Paul Stokes is a 68 y.o. male who presents today for his Complete Annual Exam. He feels well. He reports exercising none. He reports he is sleeping well.   Health Maintenance  Topic Date Due   DTaP/Tdap/Td vaccine (1 - Tdap) Never done   Eye exam for diabetics  01/28/2023   Yearly kidney health urinalysis for diabetes  05/31/2023   COVID-19 Vaccine (5 - 2024-25 season) 06/18/2023*   Zoster (Shingles) Vaccine (1 of 2) 08/31/2023*   Hemoglobin A1C  07/17/2023   Medicare Annual Wellness Visit  01/11/2024   Yearly kidney function blood test for diabetes  01/17/2024   Complete foot exam   06/01/2024   Colon Cancer Screening  09/10/2024   Pneumonia Vaccine  Completed   Flu Shot  Completed   Hepatitis C Screening  Completed   HPV Vaccine  Aged Out  *Topic was postponed. The date shown is not the original due date.    Lab Results  Component Value Date   PSA1 0.7 05/30/2022   PSA1 3.1 08/06/2018   PSA1 3.3 06/15/2015   PSA 2.6 05/23/2013    HPI  Review of Systems  Constitutional:  Positive for unexpected weight change. Negative for chills and fatigue.  HENT:  Positive for tinnitus. Negative for nosebleeds and trouble swallowing.   Eyes:  Negative for visual disturbance.  Respiratory:  Negative for cough, chest tightness, shortness of breath and wheezing.   Cardiovascular:  Negative for chest pain, palpitations and leg swelling.  Gastrointestinal:  Negative for abdominal pain, constipation and diarrhea.  Genitourinary:  Negative for dysuria and urgency.  Musculoskeletal:  Positive for arthralgias and gait problem.  Neurological:  Negative for dizziness, weakness, light-headedness, numbness and headaches.  Psychiatric/Behavioral:  Negative for dysphoric mood and sleep disturbance. The patient is not nervous/anxious.      Lab Results  Component Value Date   NA  142 01/17/2023   K 4.4 01/17/2023   CO2 25 05/30/2022   GLUCOSE 129 (H) 05/30/2022   BUN 14 01/17/2023   CREATININE 1.0 01/17/2023   CALCIUM 9.6 01/17/2023   EGFR 82 01/17/2023   GFRNONAA >60 05/03/2022   Lab Results  Component Value Date   CHOL 154 05/30/2022   HDL 77 05/30/2022   LDLCALC 62 05/30/2022   TRIG 80 05/30/2022   CHOLHDL 2.0 05/30/2022   Lab Results  Component Value Date   TSH 2.69 01/17/2023   Lab Results  Component Value Date   HGBA1C 7.6 01/17/2023   Lab Results  Component Value Date   WBC 5.6 05/03/2022   HGB 13.2 05/03/2022   HCT 41.1 05/03/2022   MCV 90.7 05/03/2022   PLT 286 05/03/2022   Lab Results  Component Value Date   ALT 19 01/17/2023   AST 15 01/17/2023   ALKPHOS 87 01/17/2023   BILITOT 0.7 05/30/2022   No results found for: "25OHVITD2", "25OHVITD3", "VD25OH"   Patient Active Problem List   Diagnosis Date Noted   Essential hypertension 05/03/2022   Major depression single episode, in partial remission (HCC) 05/03/2022   Chronic obstructive pulmonary disease (HCC) 05/02/2022   Benign essential tremor 07/26/2021   History of gross hematuria 01/05/2021   History of colonic polyps    Aortic atherosclerosis (HCC) 08/27/2019   BPH with obstruction/lower urinary tract symptoms 06/15/2015   Hyperlipidemia due to type  1 diabetes mellitus (HCC) 06/15/2015   Insomnia, persistent 03/06/2015   Compulsive tobacco user syndrome 03/06/2015   Type 1 diabetes mellitus with sensory neuropathy (HCC) 03/06/2015   Presence of insulin pump 06/08/2014    Allergies  Allergen Reactions   Lisinopril Cough    Past Surgical History:  Procedure Laterality Date   CATARACT EXTRACTION, BILATERAL     COLONOSCOPY  08/14/2013   7 tubular adenomas   COLONOSCOPY WITH PROPOFOL N/A 09/10/2019   Procedure: COLONOSCOPY WITH PROPOFOL;  Surgeon: Midge Minium, MD;  Location: [facility] ENDOSCOPY;  Service: Endoscopy;  Laterality: N/A;   COLONOSCOPY WITH PROPOFOL N/A  09/11/2019   Procedure: COLONOSCOPY WITH PROPOFOL;  Surgeon: Wyline Mood, MD;  Location: [facility] ENDOSCOPY;  Service: Gastroenterology;  Laterality: N/A;   HOLEP-LASER ENUCLEATION OF THE PROSTATE WITH MORCELLATION N/A 04/02/2021   Procedure: HOLEP-LASER ENUCLEATION OF THE PROSTATE WITH MORCELLATION;  Surgeon: Sondra Come, MD;  Location: [facility] ORS;  Service: Urology;  Laterality: N/A;    Social History   Tobacco Use   Smoking status: Former    Current packs/day: 0.00    Average packs/day: 0.3 packs/day for 53.9 years (13.5 ttl pk-yrs)    Types: Cigarettes    Start date: 05/12/1968    Quit date: 04/13/2022    Years since quitting: 1.1    Passive exposure: Past   Smokeless tobacco: Never  Vaping Use   Vaping status: Never Used  Substance Use Topics   Alcohol use: No    Alcohol/week: 0.0 standard drinks of alcohol   Drug use: No     Medication list has been reviewed and updated.  Current Meds  Medication Sig   albuterol (PROVENTIL) (2.5 MG/3ML) 0.083% nebulizer solution TAKE 3 MLS BY NEBULIZATION EVERY 4 HOURSAS NEEDED FOR WHEEZING OR SHORTNESS OF BREATH   albuterol (VENTOLIN HFA) 108 (90 Base) MCG/ACT inhaler Inhale 2 puffs into the lungs every 4 (four) hours as needed for wheezing or shortness of breath.   aspirin EC 81 MG tablet Take 81 mg by mouth daily. Swallow whole.   BAYER CONTOUR NEXT TEST test strip    buPROPion (WELLBUTRIN SR) 150 MG 12 hr tablet TAKE 1 TABLET BY MOUTH EVERY MORNING AND2 TABLETS AT BEDTIME   Continuous Blood Gluc Sensor (DEXCOM G6 SENSOR) MISC by Does not apply route.   Cyanocobalamin 5000 MCG/ML LIQD Place under the tongue daily. Liquid   Fluticasone-Umeclidin-Vilant (TRELEGY ELLIPTA) 200-62.5-25 MCG/ACT AEPB Inhale into the lungs.   gabapentin (NEURONTIN) 300 MG capsule TAKE 2 CAPULES BY MOUTH THREE TIMES DAILY   HUMALOG 100 UNIT/ML injection Inject 80 Units into the skin See admin instructions. 80 units per day through insulin pump   losartan  (COZAAR) 25 MG tablet TAKE ONE TABLET (25 MG) BY MOUTH EVERY DAY   Semaglutide,0.25 or 0.5MG /DOS, 2 MG/3ML SOPN Inject 0.25 mg weekly for two weeks and then 0.5 mg weekly.   Spacer/Aero-Holding Chambers (AEROCHAMBER MV) inhaler Use as instructed   [DISCONTINUED] pravastatin (PRAVACHOL) 40 MG tablet Take 1 tablet (40 mg total) by mouth daily.   [DISCONTINUED] predniSONE (DELTASONE) 10 MG tablet Take 10 mg by mouth daily with breakfast.       06/02/2023    8:07 AM 05/29/2023   10:11 AM 11/28/2022    8:03 AM 05/30/2022    8:14 AM  GAD 7 : Generalized Anxiety Score  Nervous, Anxious, on Edge 0 0 2 2  Control/stop worrying 0 0 2 1  Worry too much - different things 0 0 2 1  Trouble relaxing 1 1 1 2   Restless 2 0 1 2  Easily annoyed or irritable 1 2 1 2   Afraid - awful might happen 0 0 0 1  Total GAD 7 Score 4 3 9 11   Anxiety Difficulty Not difficult at all Not difficult at all Not difficult at all Somewhat difficult       06/02/2023    8:06 AM 05/29/2023   10:11 AM 01/11/2023    1:43 PM  Depression screen PHQ 2/9  Decreased Interest 0 0 0  Down, Depressed, Hopeless 0 0 0  PHQ - 2 Score 0 0 0  Altered sleeping 2 0 0  Tired, decreased energy 1 2 0  Change in appetite 0 0 0  Feeling bad or failure about yourself  0 0 0  Trouble concentrating 0 1 0  Moving slowly or fidgety/restless 0 1 0  Suicidal thoughts 2 0 0  PHQ-9 Score 5 4 0  Difficult doing work/chores Not difficult at all Not difficult at all Not difficult at all    BP Readings from Last 3 Encounters:  06/02/23 122/78  05/29/23 128/72  01/11/23 120/68    Physical Exam Vitals and nursing note reviewed.  Constitutional:      Appearance: Normal appearance. He is well-developed.  HENT:     Head: Normocephalic.     Right Ear: Tympanic membrane, ear canal and external ear normal.     Left Ear: Tympanic membrane, ear canal and external ear normal.     Nose: Nose normal.  Eyes:     Conjunctiva/sclera: Conjunctivae  normal.     Pupils: Pupils are equal, round, and reactive to light.  Neck:     Thyroid: No thyromegaly.     Vascular: No carotid bruit.  Cardiovascular:     Rate and Rhythm: Normal rate and regular rhythm.     Heart sounds: Normal heart sounds.  Pulmonary:     Effort: Pulmonary effort is normal.     Breath sounds: Normal breath sounds. No wheezing.  Chest:  Breasts:    Right: No mass.     Left: No mass.  Abdominal:     General: Bowel sounds are normal.     Palpations: Abdomen is soft.     Tenderness: There is no abdominal tenderness.  Musculoskeletal:        General: Normal range of motion.     Cervical back: Normal range of motion and neck supple.  Lymphadenopathy:     Cervical: No cervical adenopathy.  Skin:    General: Skin is warm and dry.     Capillary Refill: Capillary refill takes less than 2 seconds.  Neurological:     General: No focal deficit present.     Mental Status: He is alert and oriented to person, place, and time.     Deep Tendon Reflexes: Reflexes are normal and symmetric.  Psychiatric:        Attention and Perception: Attention normal.        Mood and Affect: Mood normal.        Thought Content: Thought content normal.    Diabetic Foot Exam - Simple   Simple Foot Form Diabetic Foot exam was performed with the following findings: Yes 06/02/2023  8:15 AM  Visual Inspection No deformities, no ulcerations, no other skin breakdown bilaterally: Yes Sensation Testing Intact to touch and monofilament testing bilaterally: Yes Pulse Check Posterior Tibialis and Dorsalis pulse intact bilaterally: Yes Comments      Wt Readings  from Last 3 Encounters:  06/02/23 194 lb (88 kg)  05/29/23 197 lb (89.4 kg)  01/11/23 191 lb 3.2 oz (86.7 kg)    BP 122/78   Pulse 69   Ht 5\' 7"  (1.702 m)   Wt 194 lb (88 kg)   SpO2 96%   BMI 30.38 kg/m   Assessment and Plan:  Problem List Items Addressed This Visit       Unprioritized   Type 1 diabetes mellitus with  sensory neuropathy (HCC)   Followed by Endo On insulin pump.  Last A1C 7.6 Starting Ozempic soon to help with A1C and weight      Relevant Medications   Semaglutide,0.25 or 0.5MG /DOS, 2 MG/3ML SOPN   pravastatin (PRAVACHOL) 40 MG tablet   Other Relevant Orders   Comprehensive metabolic panel   Hemoglobin A1c   Microalbumin / creatinine urine ratio   Hyperlipidemia due to type 1 diabetes mellitus (HCC)   LDL is  Lab Results  Component Value Date   LDLCALC 62 05/30/2022   Current regimen is pravachol.  Tolerating medications well without issues.       Relevant Medications   Semaglutide,0.25 or 0.5MG /DOS, 2 MG/3ML SOPN   pravastatin (PRAVACHOL) 40 MG tablet   Other Relevant Orders   Lipid panel   Aortic atherosclerosis (HCC)   Relevant Medications   pravastatin (PRAVACHOL) 40 MG tablet   Chronic obstructive pulmonary disease (HCC)   COPD symptoms stable. On Trelegy daily.  He has prednisone to take prn. Does not use nebulizer daily - only PRN       Relevant Orders   CBC with Differential/Platelet   Essential hypertension   Controlled BP with normal exam. Current regimen is Losartan. Will continue same medications; encourage continued reduced sodium diet.       Relevant Medications   pravastatin (PRAVACHOL) 40 MG tablet   Other Relevant Orders   CBC with Differential/Platelet   Comprehensive metabolic panel   Major depression single episode, in partial remission (HCC)   Clinically stable on Wellbutrin with good response, No SI or HI reported. He has reduced his dose to 2 at bedtime - he will reduce to one at bedtime then discontinue       Other Visit Diagnoses       Annual physical exam    -  Primary   up to date on screenings and immunizations     Prostate cancer screening       Relevant Orders   PSA     Long-term insulin use (HCC)           No follow-ups on file.    Reubin Milan, MD [facility]  [place]

## 2023-06-02 NOTE — Assessment & Plan Note (Addendum)
Clinically stable on Wellbutrin with good response, No SI or HI reported. He has reduced his dose to 2 at bedtime - he will reduce to one at bedtime then discontinue

## 2023-06-03 LAB — CBC WITH DIFFERENTIAL/PLATELET
Basophils Absolute: 0.2 10*3/uL (ref 0.0–0.2)
Basos: 2 %
EOS (ABSOLUTE): 0.2 10*3/uL (ref 0.0–0.4)
Eos: 3 %
Hematocrit: 44.5 % (ref 37.5–51.0)
Hemoglobin: 14.5 g/dL (ref 13.0–17.7)
Immature Grans (Abs): 0 10*3/uL (ref 0.0–0.1)
Immature Granulocytes: 1 %
Lymphocytes Absolute: 2.5 10*3/uL (ref 0.7–3.1)
Lymphs: 30 %
MCH: 28.7 pg (ref 26.6–33.0)
MCHC: 32.6 g/dL (ref 31.5–35.7)
MCV: 88 fL (ref 79–97)
Monocytes Absolute: 0.8 10*3/uL (ref 0.1–0.9)
Monocytes: 10 %
Neutrophils Absolute: 4.5 10*3/uL (ref 1.4–7.0)
Neutrophils: 54 %
Platelets: 389 10*3/uL (ref 150–450)
RBC: 5.05 x10E6/uL (ref 4.14–5.80)
RDW: 11.8 % (ref 11.6–15.4)
WBC: 8.3 10*3/uL (ref 3.4–10.8)

## 2023-06-03 LAB — COMPREHENSIVE METABOLIC PANEL
ALT: 19 [IU]/L (ref 0–44)
AST: 19 [IU]/L (ref 0–40)
Albumin: 4.5 g/dL (ref 3.9–4.9)
Alkaline Phosphatase: 114 [IU]/L (ref 44–121)
BUN/Creatinine Ratio: 22 (ref 10–24)
BUN: 23 mg/dL (ref 8–27)
Bilirubin Total: 0.5 mg/dL (ref 0.0–1.2)
CO2: 23 mmol/L (ref 20–29)
Calcium: 10.2 mg/dL (ref 8.6–10.2)
Chloride: 102 mmol/L (ref 96–106)
Creatinine, Ser: 1.05 mg/dL (ref 0.76–1.27)
Globulin, Total: 2.5 g/dL (ref 1.5–4.5)
Glucose: 127 mg/dL — ABNORMAL HIGH (ref 70–99)
Potassium: 5.2 mmol/L (ref 3.5–5.2)
Sodium: 140 mmol/L (ref 134–144)
Total Protein: 7 g/dL (ref 6.0–8.5)
eGFR: 77 mL/min/{1.73_m2} (ref >59)

## 2023-06-03 LAB — LIPID PANEL
Chol/HDL Ratio: 2.9 {ratio} (ref 0.0–5.0)
Cholesterol, Total: 131 mg/dL (ref 100–199)
HDL: 45 mg/dL (ref >39)
LDL Chol Calc (NIH): 69 mg/dL (ref 0–99)
Triglycerides: 91 mg/dL (ref 0–149)
VLDL Cholesterol Cal: 17 mg/dL (ref 5–40)

## 2023-06-03 LAB — MICROALBUMIN / CREATININE URINE RATIO
Creatinine, Urine: 105.2 mg/dL
Microalb/Creat Ratio: <3 mg/g{creat} (ref 0–29)
Microalbumin, Urine: <3 ug/mL

## 2023-06-03 LAB — HEMOGLOBIN A1C
Est. average glucose Bld gHb Est-mCnc: 183 mg/dL
Hgb A1c MFr Bld: 8 % — ABNORMAL HIGH (ref 4.8–5.6)

## 2023-06-03 LAB — PSA: Prostate Specific Ag, Serum: 0.9 ng/mL (ref 0.0–4.0)

## 2023-06-04 ENCOUNTER — Encounter: Payer: Self-pay | Admitting: Internal Medicine

## 2023-06-05 ENCOUNTER — Encounter: Payer: Self-pay | Admitting: Internal Medicine

## 2023-06-05 NOTE — Progress Notes (Signed)
Please schedule an appt with Dr. Ashley Royalty.  KP

## 2023-06-06 ENCOUNTER — Encounter: Payer: Medicare HMO | Admitting: Family Medicine

## 2023-06-06 ENCOUNTER — Ambulatory Visit (INDEPENDENT_AMBULATORY_CARE_PROVIDER_SITE_OTHER): Payer: Medicare HMO | Admitting: Family Medicine

## 2023-06-06 ENCOUNTER — Encounter: Payer: Self-pay | Admitting: Family Medicine

## 2023-06-06 VITALS — BP 110/70 | HR 93 | Ht 67.0 in | Wt 193.0 lb

## 2023-06-06 DIAGNOSIS — M25551 Pain in right hip: Secondary | ICD-10-CM

## 2023-06-06 MED ORDER — BACLOFEN 5 MG PO TABS: 1.0000 | ORAL_TABLET | Freq: Every evening | ORAL | 0 refills | Status: DC | PRN

## 2023-06-06 MED ORDER — CELECOXIB 50 MG PO CAPS: 50.0000 mg | ORAL_CAPSULE | Freq: Two times a day (BID) | ORAL | 0 refills | Status: DC | PRN

## 2023-06-07 NOTE — Assessment & Plan Note (Signed)
History of Present Illness Mr. Adriance, a patient with a history of sciatic nerve problems, presents with low back pain, predominantly on the right side at lateral hip, which occasionally radiates to the right leg. The pain is severe enough to limit his ability to bend over, particularly when attempting to put on a sock. The patient reports that the pain is not consistently severe, with some days being more tolerable than others. He has previously received steroid shots for this issue, which he reports were effective in providing relief. However, he has not received a shot in approximately five years. The patient is currently managing his pain with ibuprofen.  Physical Exam MUSCULOSKELETAL: Tenderness upon palpation just posterior to the right greater trochanter, recreates his symptoms Limited range of motion in right hip with no pain or discomfort. Provocative testing localizes to lateral hip, GTB. NEUROLOGICAL: -SLR.  Assessment and Plan Right Greater Trochanteric Pain Syndrome -Refer to physical therapy for targeted exercises and stretching to improve mobility and reduce pain. -Prescribe Baclofen as needed for muscle relaxation, primarily at night due to potential drowsiness. -Prescribe Diclofenac or Celebrex (depending on coverage) as needed for significant pain, particularly on therapy days. -Recommend over-the-counter Voltaren gel for daily pain management, up to four times a day as needed. -Schedule follow-up appointment in two months to assess progress and adjust treatment plan as necessary.

## 2023-06-07 NOTE — Patient Instructions (Addendum)
Plan  Condition: Right Greater Trochanteric Pain Syndrome  1. Physical Therapy    - Refer for targeted exercises and stretching to improve mobility and reduce pain.  2. Medication for Muscle Relaxation    - Prescribe Baclofen as needed, primarily at night due to potential drowsiness.  3. Pain Management    - Prescribe Celebrex for significant pain, especially on therapy days.    - Recommend over-the-counter Voltaren gel for daily pain management, up to four times a day as needed.  4. Follow-Up    - Schedule a follow-up appointment in two months to assess progress and adjust the treatment plan as necessary.  Please proceed with these steps and feel free to reach out if you have any questions or concerns!

## 2023-06-07 NOTE — Progress Notes (Signed)
Primary Care / Sports Medicine Office Visit  Patient Information:  Patient ID: Paul Stokes, male DOB: 09/23/1955 Age: 68 y.o. MRN: [id number]   Paul Stokes is a pleasant 68 y.o. male presenting with the following:  Chief Complaint  Patient presents with   Back Pain    Low back pain R>L. Occasional R leg pain. Has had ESI in the past. Bending is a aggravating factor. Patient takes IBU for pain. No imaging of his back.     Vitals:   06/06/23 1335  BP: 110/70  Pulse: 93  SpO2: 96%   Vitals:   06/06/23 1335  Weight: 193 lb (87.5 kg)  Height: 5\' 7"  (1.702 m)   Body mass index is 30.23 kg/m.  DG Hip Unilat W OR W/O Pelvis 2-3 Views Right Result Date: 06/04/2023 CLINICAL DATA:  Right posterior lower back pain extending into the right groin and gluteal region for 1 year. Limited range of motion. EXAM: DG HIP (WITH OR WITHOUT PELVIS) 2-3V RIGHT COMPARISON:  None available FINDINGS: No fracture, dislocation, or soft tissue abnormality. Joint spaces are maintained. IMPRESSION: No significant radiographic abnormality of the pelvis or right hip. Electronically Signed   By: Acquanetta Belling M.D.   On: 06/04/2023 20:11     Independent interpretation of notes and tests performed by another provider:   None  Procedures performed:   None  Pertinent History, Exam, Impression, and Recommendations:   Problem List Items Addressed This Visit     Greater trochanteric pain syndrome of right lower extremity - Primary   History of Present Illness Paul Stokes, a patient with a history of sciatic nerve problems, presents with low back pain, predominantly on the right side at lateral hip, which occasionally radiates to the right leg. The pain is severe enough to limit his ability to bend over, particularly when attempting to put on a sock. The patient reports that the pain is not consistently severe, with some days being more tolerable than others. He has previously received steroid shots for this  issue, which he reports were effective in providing relief. However, he has not received a shot in approximately five years. The patient is currently managing his pain with ibuprofen.  Physical Exam MUSCULOSKELETAL: Tenderness upon palpation just posterior to the right greater trochanter, recreates his symptoms Limited range of motion in right hip with no pain or discomfort. Provocative testing localizes to lateral hip, GTB. NEUROLOGICAL: -SLR.  Assessment and Plan Right Greater Trochanteric Pain Syndrome -Refer to physical therapy for targeted exercises and stretching to improve mobility and reduce pain. -Prescribe Baclofen as needed for muscle relaxation, primarily at night due to potential drowsiness. -Prescribe Diclofenac or Celebrex (depending on coverage) as needed for significant pain, particularly on therapy days. -Recommend over-the-counter Voltaren gel for daily pain management, up to four times a day as needed. -Schedule follow-up appointment in two months to assess progress and adjust treatment plan as necessary.      Relevant Medications   Baclofen 5 MG TABS   celecoxib (CELEBREX) 50 MG capsule   Other Relevant Orders   Ambulatory referral to Physical Therapy     Orders & Medications Medications:  Meds ordered this encounter  Medications   Baclofen 5 MG TABS    Sig: Take 1 tablet (5 mg total) by mouth at bedtime as needed (muscle pain).    Dispense:  30 tablet    Refill:  0   celecoxib (CELEBREX) 50 MG capsule    Sig:  Take 1 capsule (50 mg total) by mouth 2 (two) times daily as needed for pain.    Dispense:  60 capsule    Refill:  0   Orders Placed This Encounter  Procedures   Ambulatory referral to Physical Therapy     Return in about 2 months (around 08/04/2023) for follow-up right hip.     Jerrol Banana, MD, [facility]   Primary Care Sports Medicine Primary Care and Sports Medicine at [facility]

## 2023-06-13 DIAGNOSIS — E1065 Type 1 diabetes mellitus with hyperglycemia: Secondary | ICD-10-CM | POA: Diagnosis not present

## 2023-06-14 ENCOUNTER — Ambulatory Visit: Payer: Medicare HMO | Attending: Family Medicine | Admitting: Physical Therapy

## 2023-06-14 ENCOUNTER — Encounter: Payer: Self-pay | Admitting: Physical Therapy

## 2023-06-14 DIAGNOSIS — M25551 Pain in right hip: Secondary | ICD-10-CM | POA: Insufficient documentation

## 2023-06-14 DIAGNOSIS — M5459 Other low back pain: Secondary | ICD-10-CM | POA: Diagnosis not present

## 2023-06-14 DIAGNOSIS — M792 Neuralgia and neuritis, unspecified: Secondary | ICD-10-CM | POA: Diagnosis not present

## 2023-06-14 DIAGNOSIS — M25552 Pain in left hip: Secondary | ICD-10-CM | POA: Insufficient documentation

## 2023-06-14 NOTE — Therapy (Unsigned)
OUTPATIENT PHYSICAL THERAPY EVALUATION   Patient Name: Paul Stokes MRN: [id number] DOB:March 23, 1956, 68 y.o., male Today's Date: 06/14/2023  END OF SESSION:  PT End of Session - 06/14/23 1926     Visit Number 1    Number of Visits 17    Date for PT Re-Evaluation 09/06/23    Authorization Type [employer] 2025 - reporting from 06/14/2023    PT Start Time 1303    PT Stop Time 1400    PT Time Calculation (min) 57 min    Activity Tolerance Patient tolerated treatment well    Behavior During Therapy [facility] for tasks assessed/performed             Past Medical History:  Diagnosis Date   Depression    Diabetes mellitus without complication (HCC)    type 1; insulin pump   Emphysema of lung (HCC)    GERD (gastroesophageal reflux disease)    Hyperlipidemia    Prostate disease    Past Surgical History:  Procedure Laterality Date   CATARACT EXTRACTION, BILATERAL     COLONOSCOPY  08/14/2013   7 tubular adenomas   COLONOSCOPY WITH PROPOFOL N/A 09/10/2019   Procedure: COLONOSCOPY WITH PROPOFOL;  Surgeon: Midge Minium, MD;  Location: [facility] ENDOSCOPY;  Service: Endoscopy;  Laterality: N/A;   COLONOSCOPY WITH PROPOFOL N/A 09/11/2019   Procedure: COLONOSCOPY WITH PROPOFOL;  Surgeon: Wyline Mood, MD;  Location: [facility] ENDOSCOPY;  Service: Gastroenterology;  Laterality: N/A;   HOLEP-LASER ENUCLEATION OF THE PROSTATE WITH MORCELLATION N/A 04/02/2021   Procedure: HOLEP-LASER ENUCLEATION OF THE PROSTATE WITH MORCELLATION;  Surgeon: Sondra Come, MD;  Location: [facility] ORS;  Service: Urology;  Laterality: N/A;   Patient Active Problem List   Diagnosis Date Noted   Greater trochanteric pain syndrome of right lower extremity 06/06/2023   Essential hypertension 05/03/2022   Major depression single episode, in partial remission (HCC) 05/03/2022   Chronic obstructive pulmonary disease (HCC) 05/02/2022   Benign essential tremor 07/26/2021   History of gross hematuria 01/05/2021   History of  colonic polyps    Aortic atherosclerosis (HCC) 08/27/2019   BPH with obstruction/lower urinary tract symptoms 06/15/2015   Hyperlipidemia due to type 1 diabetes mellitus (HCC) 06/15/2015   Insomnia, persistent 03/06/2015   Compulsive tobacco user syndrome 03/06/2015   Type 1 diabetes mellitus with sensory neuropathy (HCC) 03/06/2015   Presence of insulin pump 06/08/2014    PCP: Reubin Milan, MD  REFERRING PROVIDER: Jerrol Banana, MD  REFERRING DIAG: Greater trochanteric pain syndrome of right lower extremity  THERAPY DIAG:  Other low back pain  Pain of both hip joints  Neuralgia and neuritis  Rationale for Evaluation and Treatment: Rehabilitation  ONSET DATE: Approximately 2 years ago  SUBJECTIVE:  SUBJECTIVE STATEMENT: Patient arrives to initial evaluation with several years of bilateral low back pain that began spreading to his posterior hips in the last couple years. Patient states he used to receive injections for his back pain but stopped approximately 5-6 years ago. Patient reports constant low back pain with intermittent bilateral hip/buttock pain that switches sides that has been getting worse since onset. Patient states he experiences intermittent sharp pain down the posterior side of his legs (occasionally to his ankles) and numbness/tingling in both feet (R > L). Patient reports feeling weakness in his legs for quite some time which makes it more challenging to lift objects off the floor. He reports not feeling any mechanical symptoms in either hip. Patient states his pain tends to get worse throughout the day with activity (worst in the evenings) and eases during the night when he is sleeping/resting. Patient reports occasional leg cramps in his lower legs that wake him up at night but his  back and hip pain does not due to sleeping medication he takes. He would like to be able to reach his feet and put on his socks without difficulty or discomfort.   PERTINENT HISTORY: Patient is a 68 y.o. male who presents to outpatient physical therapy with a referral for medical diagnosis greater trochanteric pain syndrome of right lower extremity. This patient's chief complaints consist of bilateral low back pain and intermittent bilateral hip pain that radiates down the lower extremities, leading to the following functional deficits: lumbar spine range of motion deficits, hip range of motion deficits, and glute muscle strength deficits. Relevant past medical history and comorbidities include emphysema of lungs, diabetes mellitus, GERD, and hyperlipidemia.    PAIN:  Are you having pain? Yes NPRS:  Current: Left Hip: 1/10, Right Hip: 0/10 Best: Left Hip: 0/10, Right Hip: 0/10 Worst: Left Hip: 6-7/10, Right Hip: 6-7/10 Pain location: Bilateral low back, bilateral posterior hip/glute region Pain description: tension, occasional sharp pain into buttock and down posterior leg (occasionally goes below the knee)  Aggravating factors: putting on socks, prolonged standing, picking objects off the floor  Relieving factors: rest, Ibuprofen   Irritability: gradually worsens throughout day, eases overnight after prolonged rest  FUNCTIONAL LIMITATIONS: putting on socks, picking objects off the floor, lifting, standing    LEISURE: yard work, walking dog    PRECAUTIONS: None  RED FLAGS: Patient denies hx of cancer, stroke, seizures, heart problems, unexplained weight loss, unexplained changes in bowel or bladder problems, unexplained stumbling or dropping things, osteoporosis, and spinal surgery     WEIGHT BEARING RESTRICTIONS: No  FALLS:  Has patient fallen in last 6 months? Yes. Number of falls 1 - Fall occurred in November 2024. Patient was working at [employer] and tripped on something. Fell to  floor but sustained no injuries.    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   PLOF: Independent  PATIENT GOALS: Be able to reach his foot and put on his sock   NEXT MD VISIT:   OBJECTIVE:   DIAGNOSTIC FINDINGS:  DG HIP UNILAT W OR W/O PELVIS - Report from 05/29/23:  IMPRESSION: No significant radiographic abnormality of the pelvis or right hip.   MR LUMBAR SPINE WO CONTRAST - Report from 10/01/19: IMPRESSION: Lumbar spondylosis as outlined and not significantly changed since MRI 12/21/2017. Findings are most notably as follows.   At L4-L5, there is a disc bulge with endplate spurring. Superimposed small right center/subarticular disc protrusion. The disc  protrusion contributes to significant right subarticular stenosis, encroaching upon the descending right L5 nerve root. Correlate for right L5 radiculopathy. Mild left subarticular narrowing. Moderate right neural foraminal narrowing.   At L5-S1, small disc bulge with superimposed tiny right subarticular disc protrusion. As before, the disc protrusion contacts the descending right S1 nerve root within the right subarticular zone. Correlate for right S1 radiculopathy.   SELF- REPORTED FUNCTION Modified Oswestry Low Back Pain Disability Questionnaire Score: 14% (7/50)  OBSERVATION/INSPECTION Posture Posture (seated): slight forward head, rounded shoulders Posture (standing): slight forward head, rounded shoulders   SPINE MOTION  LUMBAR SPINE AROM  *Indicates pain Flexion: 10 degrees Extension: 26 degrees Side Flexion:   R 35 degrees  L 45 degrees Rotation:  R 20 degrees L 28 degrees  Comment: Lumbar AROM measured in seated position.  Lumbar AROM with clinician overpressure: Reproduction of pain in lumbar extension.    NEUROLOGICAL  Dermatomes L2-S2 appears equal and intact to light touch except the following: Decreased sensation to L5 dermatome on  right.  Deep Tendon Reflexes R/L  0+/1+ Quadriceps reflex (L4) 0+/0+ Achilles reflex (S1)   PERIPHERAL JOINT MOTION (in degrees)  ACTIVE RANGE OF MOTION (AROM) *Indicates pain 06/14/23 Date Date  Joint/Motion R/L R/L R/L  Hip     Flexion 92/93 (Tight) / /  Extension  / / /  Abduction / / /  Adduction / / /  External rotation Limited / /  Internal rotation  Limited / /  Comments: Hip internal and external rotation measured in supine.   PASSIVE RANGE OF MOTION (PROM) *Indicates pain 06/14/23 Date Date  Joint/Motion R/L R/L R/L  Hip     Flexion  92/93 (Tight) / /  Extension  / / /  Abduction / / /  Adduction / / /  External rotation 35/43 / /  Internal rotation  32/30 / /  Comments: Initially unable to bring left LE into passive hip internal rotation. Able to achieve more passive hip internal rotation after assessing external rotation  MUSCLE PERFORMANCE (MMT):  *Indicates pain 06/14/23 Date Date  Joint/Motion R/L R/L R/L  Hip     Flexion (L1, L2) 4/4 / /  Extension (knee ext) 4/4 / /  Extension (knee flex) / / /  Abduction 4/4 / /  Adduction / / /  External rotation / / /  Internal rotation  / / /  Knee     Extension (L3) 5/5 / /  Flexion (S2) / / /  Ankle/Foot     Dorsiflexion (L4) 5/5 / /  Great toe extension (L5) 3/4 / /  Eversion (S1) / / /  Plantarflexion (S1) / / /  Inversion 5/5 / /  Pronation / / /  Great toe flexion / / /  Comments: Patient had difficult time maintaining neutral hip position when assessing hip abduction strength (brought hip into slight flexion when resistance applied).   SPECIAL TESTS:  LOWER LIMB NEURODYNAMIC TESTS Straight Leg Raise (Sciatic nerve) R  = positive (reproduction of concordant pain with passive ankle dorsiflexion and eased with passive plantarflexion) L  = positive (reproduction of concordant pain with passive ankle dorsiflexion and eased with passive plantarflexion)  Comment: Reproduction of concordant pain worse  on right than the left. Felt pain radiate down posterior leg to the ankle.    HIP SPECIAL TESTS FABER: R = negative, L = negative Pain in superior/lateral glute region, no pain in lateral hip near greater trochanter   External Derotation:  R = negative, L = negative   ACCESSORY MOTION: Lumbar: CPAs: Mild tension and hypomobility from L1-L5. No reproduction of concordant pain.    PALPATION: Left Glute Region: Tenderness to palpation at left glute max/med, piriformis, and deep external rotators. Reproduction of concordant pain to lateral hip with palpation. No tenderness to greater trochanter.  Right Glute Region: No tenderness or reproduction of concordant pain on right side. No tenderness to greater trochanter.  No tenderness to right or left SIJ                                                                                                                               TREATMENT:    PATIENT EDUCATION:  Education details: Education on diagnosis and prognosis, education on anatomy/physiology of current condition  Person educated: Patient Education method: Explanation Education comprehension: verbalized understanding and needs further education  HOME EXERCISE PROGRAM: TBD  ASSESSMENT:  CLINICAL IMPRESSION: Patient is a 68 y.o. male referred to outpatient physical therapy with a medical diagnosis of greater trochanteric pain syndrome of right lower extremity who presents with signs and symptoms consistent with lumbar spine pain with radiating symptoms to the lower extremities. Patient presents with range of motion and strength impairments in the lumbar spine and hip that are limiting his ability to complete lifting, bending over, putting on socks, and prolonged standing without difficulty. Positive SLR bilaterally could indicate possible lumbar radiculopathy or proximal sciatic nerve involvement. Patient will benefit from skilled physical therapy intervention to address current body  structure impairments and activity limitations to improve function and work towards goals set in current POC in order to return to prior level of function or maximal functional improvement.    OBJECTIVE IMPAIRMENTS: decreased activity tolerance, decreased endurance, decreased ROM, decreased strength, impaired sensation, and pain.   ACTIVITY LIMITATIONS: lifting, bending, standing, squatting, and sleeping  PARTICIPATION LIMITATIONS: yard work and putting on socks, picking objects off the floor  PERSONAL FACTORS: Past/current experiences, Time since onset of injury/illness/exacerbation, and 3+ comorbidities: Emphysema, Diabetes mellitus, Hyperlipidemia, GERD  are also affecting patient's functional outcome.   REHAB POTENTIAL: Good  CLINICAL DECISION MAKING: Evolving/moderate complexity  EVALUATION COMPLEXITY: Moderate   GOALS: Goals reviewed with patient? No  SHORT TERM GOALS: Target date: 06/28/2023  Patient will be independent with initial home exercise program for self-management of symptoms. Baseline: Initial HEP to be provided at visit 2 as appropriate (06/14/23); Goal status: INITIAL   LONG TERM GOALS: Target date: 09/06/2023  Patient will be independent with a long-term home exercise program for self-management of symptoms.  Baseline: Initial HEP to be provided at visit 2 as appropriate (06/14/23); Goal status: INITIAL  2.  Patient will complete 5 Times Sit To Stand Test from 18.5 inch or lower surface without upper extremity support in equal or less than 15 seconds to demonstrate improvement in lower extremity power and strength to stand up from chair without using upper extremities.  Baseline: Will do  5 time sit to stand test at visit #2 (06/14/23); Goal status: INITIAL  3.  Patient will demonstrate improved bilateral hip flexion AROM to 120 degrees to complete meaningful tasks such as putting on his socks with less difficulty.  Baseline: R/L Hip Flexion AROM 92/93 degrees  (06/14/23); Goal status: INITIAL  4.  Patient will demonstrate improved lower extremity strength to 5/5 to complete functional activities such as prolonged standing and walking with less difficulty.  Baseline: Hip flexion/extension/abduction 4/5 bilaterally (06/14/23); Goal status: INITIAL  5.  Patient will demonstrate improvement in Patient Specific Functional Scale (PSFS) to equal or greater than 8/10 to reflect clinically significant improvement in patient's most valued functional activities. Baseline: PSFS to be completed at visit #2 (06/14/23); Goal status: INITIAL   PLAN:  PT FREQUENCY: 1-2x/week  PT DURATION: 8-12 weeks  PLANNED INTERVENTIONS: 97164- PT Re-evaluation, 97110-Therapeutic exercises, 97530- Therapeutic activity, 97112- Neuromuscular re-education, 97535- Self Care, 16109- Manual therapy, [phone]- Electrical stimulation (manual), Patient/Family education, Dry Needling, Joint mobilization, Joint manipulation, Spinal manipulation, Spinal mobilization, DME instructions, Cryotherapy, and Moist heat  PLAN FOR NEXT SESSION: Provide HEP, 5 Time sit to stand test, hip range of motion, lumbar spine range of motion, lower extremity strengthening/endurance/power    Jerrilyn Messinger [place], SPT [facility]  [place] R. Ilsa Iha, PT, DPT 06/15/23, 11:56 AM  [facility] [address] [place], [place] [address] P: [phone] I F: [phone]

## 2023-06-20 ENCOUNTER — Encounter: Payer: Self-pay | Admitting: Physical Therapy

## 2023-06-20 ENCOUNTER — Ambulatory Visit: Payer: Medicare HMO | Attending: Family Medicine | Admitting: Physical Therapy

## 2023-06-20 VITALS — BP 136/57 | HR 73

## 2023-06-20 DIAGNOSIS — M5459 Other low back pain: Secondary | ICD-10-CM | POA: Insufficient documentation

## 2023-06-20 DIAGNOSIS — M792 Neuralgia and neuritis, unspecified: Secondary | ICD-10-CM | POA: Diagnosis not present

## 2023-06-20 DIAGNOSIS — M25551 Pain in right hip: Secondary | ICD-10-CM | POA: Diagnosis not present

## 2023-06-20 DIAGNOSIS — M25552 Pain in left hip: Secondary | ICD-10-CM | POA: Diagnosis not present

## 2023-06-20 NOTE — Therapy (Signed)
 OUTPATIENT PHYSICAL THERAPY TREATMENT   Patient Name: AYAZ SONDGEROTH MRN: [id number] DOB:1955-09-01, 68 y.o., male Today's Date: 06/21/2023  END OF SESSION:  PT End of Session - 06/20/23 1748     Visit Number 2    Number of Visits 17    Date for PT Re-Evaluation 09/06/23    Authorization Type [employer] 2025 - reporting from 06/14/2023    PT Start Time 1655    PT Stop Time 1740    PT Time Calculation (min) 45 min    Activity Tolerance Patient tolerated treatment well    Behavior During Therapy [facility] for tasks assessed/performed              Past Medical History:  Diagnosis Date   Depression    Diabetes mellitus without complication (HCC)    type 1; insulin  pump   Emphysema of lung (HCC)    GERD (gastroesophageal reflux disease)    Hyperlipidemia    Prostate disease    Past Surgical History:  Procedure Laterality Date   CATARACT EXTRACTION, BILATERAL     COLONOSCOPY  08/14/2013   7 tubular adenomas   COLONOSCOPY WITH PROPOFOL  N/A 09/10/2019   Procedure: COLONOSCOPY WITH PROPOFOL ;  Surgeon: Jinny Carmine, MD;  Location: [facility] ENDOSCOPY;  Service: Endoscopy;  Laterality: N/A;   COLONOSCOPY WITH PROPOFOL  N/A 09/11/2019   Procedure: COLONOSCOPY WITH PROPOFOL ;  Surgeon: Therisa Bi, MD;  Location: [facility] ENDOSCOPY;  Service: Gastroenterology;  Laterality: N/A;   HOLEP-LASER ENUCLEATION OF THE PROSTATE WITH MORCELLATION N/A 04/02/2021   Procedure: HOLEP-LASER ENUCLEATION OF THE PROSTATE WITH MORCELLATION;  Surgeon: Francisca Redell BROCKS, MD;  Location: [facility] ORS;  Service: Urology;  Laterality: N/A;   Patient Active Problem List   Diagnosis Date Noted   Greater trochanteric pain syndrome of right lower extremity 06/06/2023   Essential hypertension 05/03/2022   Major depression single episode, in partial remission (HCC) 05/03/2022   Chronic obstructive pulmonary disease (HCC) 05/02/2022   Benign essential tremor 07/26/2021   History of gross hematuria 01/05/2021   History of  colonic polyps    Aortic atherosclerosis (HCC) 08/27/2019   BPH with obstruction/lower urinary tract symptoms 06/15/2015   Hyperlipidemia due to type 1 diabetes mellitus (HCC) 06/15/2015   Insomnia, persistent 03/06/2015   Compulsive tobacco user syndrome 03/06/2015   Type 1 diabetes mellitus with sensory neuropathy (HCC) 03/06/2015   Presence of insulin  pump 06/08/2014    PCP: Justus Leita DEL, MD  REFERRING PROVIDER: Alvia Selinda PARAS, MD  REFERRING DIAG: Greater trochanteric pain syndrome of right lower extremity  THERAPY DIAG:  Other low back pain  Pain of both hip joints  Neuralgia and neuritis  Rationale for Evaluation and Treatment: Rehabilitation  ONSET DATE: Approximately 2 years ago  SUBJECTIVE:  PERTINENT HISTORY: Patient is a 68 y.o. male who presents to outpatient physical therapy with a referral for medical diagnosis greater trochanteric pain syndrome of right lower extremity. This patient's chief complaints consist of bilateral low back pain and intermittent bilateral hip pain that radiates down the lower extremities, leading to the following functional deficits: lumbar spine range of motion deficits, hip range of motion deficits, and glute muscle strength deficits. Relevant past medical history and comorbidities include emphysema of lungs, diabetes mellitus, GERD, and hyperlipidemia.    SUBJECTIVE STATEMENT: Patient arrives to session stating he is feeling not bad today. He reports his pain was worse this morning starting in his low back and going to bilateral glutes. He states the pain started to get better once he started moving around. Patient states his pain is currently 4/10 in the left glute region only with continued ache in his low back.   PAIN:  Are you having pain? Yes NPRS:   Current: Left Hip: 4/10, Right Hip: 0/10; ache in low back    FUNCTIONAL LIMITATIONS: putting on socks, picking objects off the floor, lifting, standing    LEISURE: yard work, walking dog    PRECAUTIONS: None   WEIGHT BEARING RESTRICTIONS: No  FALLS:  Has patient fallen in last 6 months? Yes. Number of falls 1 - Fall occurred in November 2024. Patient was working at [employer] and tripped on something. Fell to floor but sustained no injuries.    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   PLOF: Independent  PATIENT GOALS: Be able to reach his foot and put on his sock   NEXT MD VISIT:   OBJECTIVE:   Vitals:   06/20/23 1654  BP: (!) 136/57  Pulse: 73  SpO2: 97%    SELF- REPORTED FUNCTION Modified Oswestry Low Back Pain Disability Questionnaire (last measured 06/14/2023) Score: 14% (7/50)  SELF-REPORTED FUNCTION Patient Specific Functional Scale (PSFS)  Tie shoes/put on socks: 7 Bend over/pick objects off floor: 7 Standing up from seated position: 7 Average: 7  FIVE TIME SIT TO STAND TEST:  From 18.5 inch plinth, hands across chest  Trial 1: 25 seconds  Trial 2 (cued to go as fast as can): 14.91 seconds   Comment: Pain improved after completing test. Legs feel tightened but not tired.                                                                                                                              TREATMENT:    Physical Performance Test or Measurement: a physical performance test or measurement with written report.   5 Time Sit To Stand Test (see written report above)  Therapeutic exercise: therapeutic exercises that incorporate ONE parameter at one or more areas of the body to centralize symptoms, develop strength and endurance, range of motion, and flexibility required for successful completion of functional activities.     Supine Hamstring Stretch/Sciatic  Nerve Tensioner with Strap   5  seconds x 10 each leg    Quadruped Cat/Cow   1x10    1 min rest   1x5 Discontinued due to difficulty achieving motion and shortness of breath during second set    Hooklying Lower Trunk Rotations    1x10 each way    Therapeutic activities: dynamic therapeutic activities incorporating MULTIPLE parameters or areas of the body designed to achieve improved functional performance.   Supine Bridges to improve back and glute strength, hip extension ROM, and motor control needed to squat successfully   2x10   1 min rest between sets    Squats to 17 inch chair with airex pad    2x5    1 min rest between sets    Experiencing shortness of breath during second set    PATIENT EDUCATION:  Education details: Education on diagnosis and prognosis, education on anatomy/physiology of current condition  Person educated: Patient Education method: Explanation Education comprehension: verbalized understanding and needs further education  HOME EXERCISE PROGRAM: Access Code: [email or web address] URL: [email or web address] Date: 06/20/2023 Prepared by: Lavonda Thal  Exercises - Supine Lower Trunk Rotation  - 1 x daily - 2 sets - 10 reps - Supine Hamstring Stretch with Strap  - 1 x daily - 1 sets - 10 reps - 5 sec hold - Supine Bridge  - 3 x weekly - 2 sets - 10 reps - Squat with Chair Touch  - 3 x weekly - 2 sets - 5 reps  ASSESSMENT:  CLINICAL IMPRESSION: Patient arrives to session with aching pain in his low back and 4/10 hip pain. The focus of today's session was to develop an exercises program to increase lower extremity strength/endurance/power and improve lumbar spine/hip range of motion to improve performance in functional activities. Quadruped cat/cow exercise discontinued due to inability to achieve proper form/technique with cuing and increased shortness of breath during exercise. Patient had improved tolerance to squats with fewer repetitions but still experienced shortness of breath  and muscle fatigue during sets. Patient experienced periodic bouts of dizziness with position changes throughout session which subsided with seated rest. Patient tolerated other interventions well and was able to complete all other exercises with no change in pain. Patient met 5 Time Sit to Stand goal of performing 5 sit to stands in less than 15 seconds. New long term goal of performing 10 squats to 17 inch chair without airex pad added to work towards standing from seated position with less difficulty. Patient provided education on HEP to include well tolerated exercises from today's session. Patient reports a tightness in his low back and feeling that his muscles were worked but no change in pain at end of session. Patient reports breathing has returned to normal and no dizziness upon completion of session. Patient would benefit from continued management of limiting condition by skilled physical therapist to address remaining impairments and functional limitations to work towards stated goals and return to PLOF or maximal functional independence.     OBJECTIVE IMPAIRMENTS: decreased activity tolerance, decreased endurance, decreased ROM, decreased strength, impaired sensation, and pain.   ACTIVITY LIMITATIONS: lifting, bending, standing, squatting, and sleeping  PARTICIPATION LIMITATIONS: yard work and putting on socks, picking objects off the floor  PERSONAL FACTORS: Past/current experiences, Time since onset of injury/illness/exacerbation, and 3+ comorbidities: Emphysema, Diabetes mellitus, Hyperlipidemia, GERD  are also affecting patient's functional outcome.   REHAB POTENTIAL: Good  CLINICAL DECISION MAKING: Evolving/moderate complexity  EVALUATION COMPLEXITY: Moderate   GOALS: Goals reviewed  with patient? No  SHORT TERM GOALS: Target date: 06/28/2023  Patient will be independent with initial home exercise program for self-management of symptoms. Baseline: Initial HEP to be provided at  visit 2 as appropriate (06/14/23); Goal status: In progress   LONG TERM GOALS: Target date: 09/06/2023  Patient will be independent with a long-term home exercise program for self-management of symptoms.  Baseline: Initial HEP to be provided at visit 2 as appropriate (06/14/23); Goal status: In progress  2.  Patient will complete 5 Times Sit To Stand Test from 18.5 inch or lower surface without upper extremity support in equal or less than 15 seconds to demonstrate improvement in lower extremity power and strength to stand up from chair without using upper extremities.  Baseline: Will do 5 time sit to stand test at visit #2 (06/14/23); 14.91 seconds (06/20/23) Goal status: Met 06/20/2023  3.  Patient will demonstrate improved bilateral hip flexion AROM to 120 degrees to complete meaningful tasks such as putting on his socks with less difficulty.  Baseline: R/L Hip Flexion AROM 92/93 degrees (06/14/23); Goal status: In progress  4.  Patient will demonstrate improved lower extremity strength to 5/5 to complete functional activities such as prolonged standing and walking with less difficulty.  Baseline: Hip flexion/extension/abduction 4/5 bilaterally (06/14/23); Goal status: In progress  5.  Patient will demonstrate improvement in Patient Specific Functional Scale (PSFS) to equal or greater than 8/10 to reflect clinically significant improvement in patient's most valued functional activities. Baseline: PSFS to be completed at visit #2 (06/14/23); 7 (06/20/23) Goal status: In progress  6. Patient will demonstrate 10 squats to 17 inch chair without airex to complete functional activities such as standing up from a seated position with less difficulty.  Baseline: Able to perform 2x5 with airex pad on 17 inch chair with significant shortness of breath and leg fatigue (06/20/23)  Goal status: In progress   PLAN:  PT FREQUENCY: 1-2x/week  PT DURATION: 8-12 weeks  PLANNED INTERVENTIONS: 97164- PT  Re-evaluation, 97110-Therapeutic exercises, 97530- Therapeutic activity, 97112- Neuromuscular re-education, 97535- Self Care, [id number]- Manual therapy, [phone]- Electrical stimulation (manual), Patient/Family education, Dry Needling, Joint mobilization, Joint manipulation, Spinal manipulation, Spinal mobilization, DME instructions, Cryotherapy, and Moist heat  PLAN FOR NEXT SESSION: Update HE as needed, hip range of motion, lumbar spine range of motion, lower extremity strengthening/endurance/power    Merrit Waugh, SPT [facility]  Camie R. Juli, PT, DPT 06/21/23, 1:17 PM  [facility] [address] [place], [place] [address] P: [phone] I F: [phone]

## 2023-06-29 ENCOUNTER — Ambulatory Visit: Payer: Medicare HMO | Admitting: Physical Therapy

## 2023-06-29 NOTE — Therapy (Deleted)
 OUTPATIENT PHYSICAL THERAPY TREATMENT   Patient Name: Paul Stokes MRN: [id number] DOB:1956-01-30, 68 y.o., male Today's Date: 06/29/2023  END OF SESSION:     Past Medical History:  Diagnosis Date   Depression    Diabetes mellitus without complication (HCC)    type 1; insulin pump   Emphysema of lung (HCC)    GERD (gastroesophageal reflux disease)    Hyperlipidemia    Prostate disease    Past Surgical History:  Procedure Laterality Date   CATARACT EXTRACTION, BILATERAL     COLONOSCOPY  08/14/2013   7 tubular adenomas   COLONOSCOPY WITH PROPOFOL N/A 09/10/2019   Procedure: COLONOSCOPY WITH PROPOFOL;  Surgeon: Midge Minium, MD;  Location: [facility] ENDOSCOPY;  Service: Endoscopy;  Laterality: N/A;   COLONOSCOPY WITH PROPOFOL N/A 09/11/2019   Procedure: COLONOSCOPY WITH PROPOFOL;  Surgeon: Wyline Mood, MD;  Location: [facility] ENDOSCOPY;  Service: Gastroenterology;  Laterality: N/A;   HOLEP-LASER ENUCLEATION OF THE PROSTATE WITH MORCELLATION N/A 04/02/2021   Procedure: HOLEP-LASER ENUCLEATION OF THE PROSTATE WITH MORCELLATION;  Surgeon: Sondra Come, MD;  Location: [facility] ORS;  Service: Urology;  Laterality: N/A;   Patient Active Problem List   Diagnosis Date Noted   Greater trochanteric pain syndrome of right lower extremity 06/06/2023   Essential hypertension 05/03/2022   Major depression single episode, in partial remission (HCC) 05/03/2022   Chronic obstructive pulmonary disease (HCC) 05/02/2022   Benign essential tremor 07/26/2021   History of gross hematuria 01/05/2021   History of colonic polyps    Aortic atherosclerosis (HCC) 08/27/2019   BPH with obstruction/lower urinary tract symptoms 06/15/2015   Hyperlipidemia due to type 1 diabetes mellitus (HCC) 06/15/2015   Insomnia, persistent 03/06/2015   Compulsive tobacco user syndrome 03/06/2015   Type 1 diabetes mellitus with sensory neuropathy (HCC) 03/06/2015   Presence of insulin pump 06/08/2014    PCP: Reubin Milan, MD  REFERRING PROVIDER: Jerrol Banana, MD  REFERRING DIAG: Greater trochanteric pain syndrome of right lower extremity  THERAPY DIAG:  No diagnosis found.  Rationale for Evaluation and Treatment: Rehabilitation  ONSET DATE: Approximately 2 years ago  SUBJECTIVE:                                                                                                                                                                                      PERTINENT HISTORY: Patient is a 68 y.o. male who presents to outpatient physical therapy with a referral for medical diagnosis greater trochanteric pain syndrome of right lower extremity. This patient's chief complaints consist of bilateral low back pain and intermittent bilateral hip pain that radiates down the lower extremities, leading to the following  functional deficits: lumbar spine range of motion deficits, hip range of motion deficits, and glute muscle strength deficits. Relevant past medical history and comorbidities include emphysema of lungs, diabetes mellitus, GERD, and hyperlipidemia.    SUBJECTIVE STATEMENT:   Patient arrives to session stating he is feeling "not bad" today. He reports his pain was worse this morning starting in his low back and going to bilateral glutes. He states the pain started to get better once he started moving around. Patient states his pain is currently 4/10 in the left glute region only with continued ache in his low back.   PAIN:  Are you having pain? Yes NPRS:  Current: Left Hip: 4/10, Right Hip: 0/10; "ache" in low back    FUNCTIONAL LIMITATIONS: putting on socks, picking objects off the floor, lifting, standing    LEISURE: yard work, walking dog    PRECAUTIONS: None   WEIGHT BEARING RESTRICTIONS: No  FALLS:  Has patient fallen in last 6 months? Yes. Number of falls 1 - Fall occurred in November 2024. Patient was working at [employer] and tripped on something. Fell to floor but  sustained no injuries.    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   PLOF: Independent  PATIENT GOALS: Be able to reach his foot and put on his sock   NEXT MD VISIT:   OBJECTIVE:   SELF- REPORTED FUNCTION Modified Oswestry Low Back Pain Disability Questionnaire (last measured 06/14/2023) Score: 14% (7/50)  SELF-REPORTED FUNCTION (last measured 06/20/23) Patient Specific Functional Scale (PSFS)  Tie shoes/put on socks: 7 Bend over/pick objects off floor: 7 Standing up from seated position: 7 Average: 7  FIVE TIME SIT TO STAND TEST: (last measured 06/20/23) From 18.5 inch plinth, hands across chest  Trial 1: 25 seconds  Trial 2 (cued to go as fast as can): 14.91 seconds  Comment: Pain improved after completing test. Legs feel tightened but not tired.                                                                                                                              TREATMENT:    Physical Performance Test or Measurement: a physical performance test or measurement with written report.   5 Time Sit To Stand Test (see written report above)  Therapeutic exercise: therapeutic exercises that incorporate ONE parameter at one or more areas of the body to centralize symptoms, develop strength and endurance, range of motion, and flexibility required for successful completion of functional activities.     Supine Hamstring Stretch/Sciatic Nerve Tensioner with Strap   5 seconds x 10 each leg    Quadruped Cat/Cow   1x10    1 min rest   1x5 Discontinued due to difficulty achieving motion and shortness of breath during second set    Hooklying Lower Trunk Rotations    1x10 each way    Therapeutic activities: dynamic therapeutic activities incorporating MULTIPLE  parameters or areas of the body designed to achieve improved functional performance.   Supine Bridges to improve back and glute strength, hip extension ROM, and motor  control needed to squat successfully   2x10   1 min rest between sets    Squats to 17 inch chair with airex pad    2x5    1 min rest between sets    Experiencing shortness of breath during second set    PATIENT EDUCATION:  Education details: Education on diagnosis and prognosis, education on anatomy/physiology of current condition  Person educated: Patient Education method: Explanation Education comprehension: verbalized understanding and needs further education  HOME EXERCISE PROGRAM: Access Code: [email or web address] URL: [email or web address] Date: 06/20/2023 Prepared by: Juna Caban [place]  Exercises - Supine Lower Trunk Rotation  - 1 x daily - 2 sets - 10 reps - Supine Hamstring Stretch with Strap  - 1 x daily - 1 sets - 10 reps - 5 sec hold - Supine Bridge  - 3 x weekly - 2 sets - 10 reps - Squat with Chair Touch  - 3 x weekly - 2 sets - 5 reps  ASSESSMENT:  CLINICAL IMPRESSION:   Patient arrives to session with aching pain in his low back and 4/10 hip pain. The focus of today's session was to develop an exercises program to increase lower extremity strength/endurance/power and improve lumbar spine/hip range of motion to improve performance in functional activities. Quadruped cat/cow exercise discontinued due to inability to achieve proper form/technique with cuing and increased shortness of breath during exercise. Patient had improved tolerance to squats with fewer repetitions but still experienced shortness of breath and muscle fatigue during sets. Patient experienced periodic bouts of dizziness with position changes throughout session which subsided with seated rest. Patient tolerated other interventions well and was able to complete all other exercises with no change in pain. Patient met 5 Time Sit to Stand goal of performing 5 sit to stands in less than 15 seconds. New long term goal of performing 10 squats to 17 inch chair without airex pad added to work towards standing from  seated position with less difficulty. Patient provided education on HEP to include well tolerated exercises from today's session. Patient reports a tightness in his low back and feeling that his muscles were worked but no change in pain at end of session. Patient reports breathing has returned to normal and no dizziness upon completion of session. Patient would benefit from continued management of limiting condition by skilled physical therapist to address remaining impairments and functional limitations to work towards stated goals and return to PLOF or maximal functional independence.     OBJECTIVE IMPAIRMENTS: decreased activity tolerance, decreased endurance, decreased ROM, decreased strength, impaired sensation, and pain.   ACTIVITY LIMITATIONS: lifting, bending, standing, squatting, and sleeping  PARTICIPATION LIMITATIONS: yard work and putting on socks, picking objects off the floor  PERSONAL FACTORS: Past/current experiences, Time since onset of injury/illness/exacerbation, and 3+ comorbidities: Emphysema, Diabetes mellitus, Hyperlipidemia, GERD  are also affecting patient's functional outcome.   REHAB POTENTIAL: Good  CLINICAL DECISION MAKING: Evolving/moderate complexity  EVALUATION COMPLEXITY: Moderate   GOALS: Goals reviewed with patient? No  SHORT TERM GOALS: Target date: 06/28/2023  Patient will be independent with initial home exercise program for self-management of symptoms. Baseline: Initial HEP to be provided at visit 2 as appropriate (06/14/23); Goal status: In progress   LONG TERM GOALS: Target date: 09/06/2023  Patient will be independent with a long-term home exercise program for self-management of symptoms.  Baseline: Initial HEP to be provided at visit 2 as appropriate (06/14/23); Goal status: In progress  2.  Patient will complete 5 Times Sit To Stand Test from 18.5 inch or lower surface without upper extremity support in equal or less than 15 seconds to  demonstrate improvement in lower extremity power and strength to stand up from chair without using upper extremities.  Baseline: Will do 5 time sit to stand test at visit #2 (06/14/23); 14.91 seconds (06/20/23) Goal status: Met 06/20/2023  3.  Patient will demonstrate improved bilateral hip flexion AROM to 120 degrees to complete meaningful tasks such as putting on his socks with less difficulty.  Baseline: R/L Hip Flexion AROM 92/93 degrees (06/14/23); Goal status: In progress  4.  Patient will demonstrate improved lower extremity strength to 5/5 to complete functional activities such as prolonged standing and walking with less difficulty.  Baseline: Hip flexion/extension/abduction 4/5 bilaterally (06/14/23); Goal status: In progress  5.  Patient will demonstrate improvement in Patient Specific Functional Scale (PSFS) to equal or greater than 8/10 to reflect clinically significant improvement in patient's most valued functional activities. Baseline: PSFS to be completed at visit #2 (06/14/23); 7 (06/20/23) Goal status: In progress  6. Patient will demonstrate 10 squats to 17 inch chair without airex to complete functional activities such as standing up from a seated position with less difficulty.  Baseline: Able to perform 2x5 with airex pad on 17 inch chair with significant shortness of breath and leg fatigue (06/20/23)  Goal status: In progress   PLAN:  PT FREQUENCY: 1-2x/week  PT DURATION: 8-12 weeks  PLANNED INTERVENTIONS: 97164- PT Re-evaluation, 97110-Therapeutic exercises, 97530- Therapeutic activity, 97112- Neuromuscular re-education, 97535- Self Care, 82956- Manual therapy, [phone]- Electrical stimulation (manual), Patient/Family education, Dry Needling, Joint mobilization, Joint manipulation, Spinal manipulation, Spinal mobilization, DME instructions, Cryotherapy, and Moist heat  PLAN FOR NEXT SESSION: Update HE as needed, hip range of motion, lumbar spine range of motion, lower  extremity strengthening/endurance/power    Tiphani Mells [place], SPT [facility]  Huntley Dec R. Ilsa Iha, PT, DPT 06/29/23, 12:31 PM  [facility] [address] [place], [place] [address] P: [phone] I F: [phone]

## 2023-07-04 ENCOUNTER — Ambulatory Visit: Payer: Medicare HMO | Admitting: Physical Therapy

## 2023-07-06 ENCOUNTER — Encounter: Payer: Self-pay | Admitting: Physical Therapy

## 2023-07-06 ENCOUNTER — Ambulatory Visit: Payer: Medicare HMO | Admitting: Physical Therapy

## 2023-07-06 DIAGNOSIS — M792 Neuralgia and neuritis, unspecified: Secondary | ICD-10-CM

## 2023-07-06 DIAGNOSIS — M5459 Other low back pain: Secondary | ICD-10-CM | POA: Diagnosis not present

## 2023-07-06 DIAGNOSIS — M25551 Pain in right hip: Secondary | ICD-10-CM | POA: Diagnosis not present

## 2023-07-06 DIAGNOSIS — M25552 Pain in left hip: Secondary | ICD-10-CM | POA: Diagnosis not present

## 2023-07-06 NOTE — Therapy (Signed)
 OUTPATIENT PHYSICAL THERAPY TREATMENT   Patient Name: Paul Stokes MRN: [id number] DOB:Aug 24, 1955, 68 y.o., male Today's Date: 07/06/2023  END OF SESSION:  PT End of Session - 07/06/23 1736     Visit Number 3    Number of Visits 17    Date for PT Re-Evaluation 09/06/23    Authorization Type [employer] 2025 - reporting from 06/14/2023    PT Start Time 1635    PT Stop Time 1729    PT Time Calculation (min) 54 min    Activity Tolerance Patient tolerated treatment well;Patient limited by fatigue    Behavior During Therapy [facility] for tasks assessed/performed               Past Medical History:  Diagnosis Date   Depression    Diabetes mellitus without complication (HCC)    type 1; insulin pump   Emphysema of lung (HCC)    GERD (gastroesophageal reflux disease)    Hyperlipidemia    Prostate disease    Past Surgical History:  Procedure Laterality Date   CATARACT EXTRACTION, BILATERAL     COLONOSCOPY  08/14/2013   7 tubular adenomas   COLONOSCOPY WITH PROPOFOL N/A 09/10/2019   Procedure: COLONOSCOPY WITH PROPOFOL;  Surgeon: Midge Minium, MD;  Location: [facility] ENDOSCOPY;  Service: Endoscopy;  Laterality: N/A;   COLONOSCOPY WITH PROPOFOL N/A 09/11/2019   Procedure: COLONOSCOPY WITH PROPOFOL;  Surgeon: Wyline Mood, MD;  Location: [facility] ENDOSCOPY;  Service: Gastroenterology;  Laterality: N/A;   HOLEP-LASER ENUCLEATION OF THE PROSTATE WITH MORCELLATION N/A 04/02/2021   Procedure: HOLEP-LASER ENUCLEATION OF THE PROSTATE WITH MORCELLATION;  Surgeon: Sondra Come, MD;  Location: [facility] ORS;  Service: Urology;  Laterality: N/A;   Patient Active Problem List   Diagnosis Date Noted   Greater trochanteric pain syndrome of right lower extremity 06/06/2023   Essential hypertension 05/03/2022   Major depression single episode, in partial remission (HCC) 05/03/2022   Chronic obstructive pulmonary disease (HCC) 05/02/2022   Benign essential tremor 07/26/2021   History of gross  hematuria 01/05/2021   History of colonic polyps    Aortic atherosclerosis (HCC) 08/27/2019   BPH with obstruction/lower urinary tract symptoms 06/15/2015   Hyperlipidemia due to type 1 diabetes mellitus (HCC) 06/15/2015   Insomnia, persistent 03/06/2015   Compulsive tobacco user syndrome 03/06/2015   Type 1 diabetes mellitus with sensory neuropathy (HCC) 03/06/2015   Presence of insulin pump 06/08/2014    PCP: Reubin Milan, MD  REFERRING PROVIDER: Jerrol Banana, MD  REFERRING DIAG: Greater trochanteric pain syndrome of right lower extremity  THERAPY DIAG:  Other low back pain  Pain of both hip joints  Neuralgia and neuritis  Rationale for Evaluation and Treatment: Rehabilitation  ONSET DATE: Approximately 2 years ago  SUBJECTIVE:  PERTINENT HISTORY: Patient is a 68 y.o. male who presents to outpatient physical therapy with a referral for medical diagnosis greater trochanteric pain syndrome of right lower extremity. This patient's chief complaints consist of bilateral low back pain and intermittent bilateral hip pain that radiates down the lower extremities, leading to the following functional deficits: lumbar spine range of motion deficits, hip range of motion deficits, and glute muscle strength deficits. Relevant past medical history and comorbidities include emphysema of lungs, diabetes mellitus, GERD, and hyperlipidemia.    SUBJECTIVE STATEMENT: Was sick with the flu last week - feeling better today  Not feeling any pain right now Pain in his legs comes and goes - hasn't felt leg pain in a couple days  Feels aching in his low back but not until the end of the day after a full day of activities  Aching in low back is gone in the morning after resting overnight Aching in low back does not  keep him from sleeping  Hamstring stretch with the strap has been working for him - has helped him the most  HEP has been feeling good  Is on a diet and has lost 12 pounds  Has been easier the last couple weeks to put his socks and shoes on  Is able to bend over and pick things up easier as well    PAIN:  Are you having pain?  NPRS:  Current: Left Hip: 0/10, Right Hip: 0/10; slight "ache" in low back    FUNCTIONAL LIMITATIONS: putting on socks, picking objects off the floor, lifting, standing    LEISURE: yard work, walking dog    PRECAUTIONS: None   WEIGHT BEARING RESTRICTIONS: No  FALLS:  Has patient fallen in last 6 months? Yes. Number of falls 1 - Fall occurred in November 2024. Patient was working at [employer] and tripped on something. Fell to floor but sustained no injuries.    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   PLOF: Independent  PATIENT GOALS: Be able to reach his foot and put on his sock   NEXT MD VISIT:   OBJECTIVE:   SELF- REPORTED FUNCTION Modified Oswestry Low Back Pain Disability Questionnaire (last measured 06/14/2023) Score: 14% (7/50)  SELF-REPORTED FUNCTION (last measured 06/20/23) Patient Specific Functional Scale (PSFS)  Tie shoes/put on socks: 7 Bend over/pick objects off floor: 7 Standing up from seated position: 7 Average: 7  FIVE TIME SIT TO STAND TEST: (last measured 06/20/23) From 18.5 inch plinth, hands across chest  Trial 1: 25 seconds  Trial 2 (cued to go as fast as can): 14.91 seconds  Comment: Pain improved after completing test. Legs feel tightened but not tired.                                                                                                                              TREATMENT:   Therapeutic exercise: therapeutic exercises that  incorporate ONE parameter at one or more areas of the body to centralize symptoms, develop strength and endurance, range of  motion, and flexibility required for successful completion of functional activities.    NuStep level 4 using bilateral upper and lower extremities. Seat/handle setting 9/9. For improved extremity mobility, muscular endurance, and activity tolerance; and to induce the analgesic effect of aerobic exercise, stimulate improved joint nutrition, and prepare body structures and systems for following interventions. x 6  minutes. Average SPM = 72.    Seated Cat/Cow   1x10    Improved tolerance to seated position.    Quadruped Rock Back with neutral spine    1x10 (before mobs)    Feels good stretch through low back    1x10 (after mobs)     Improved range of motion, was able to sit back closer to his heels   Seated Forward Flexion Ball Rollouts with Clear Theraball   1x10   Seated Piriformis Stretch    1x30 seconds (right leg only) Pulling knee in towards opposite shoulder was more tolerable than pushing knee away.   Education on HEP including handout (seated cat/cow, quad rock backs, seated piriformis stretch)  Therapeutic activities: dynamic therapeutic activities incorporating MULTIPLE parameters or areas of the body designed to achieve improved functional performance.   Squat taps to 17 inch chair with airex pad    1x10    Needed to use inhaler after first set   1x9   Pt discontinued due to shortness of breath   Runners Step Up on 6 inch step    1x10 each leg    1 min rest     Standing Hip Abduction (to improve hip strength/endurance/stability to increase standing/walking activity tolerance    2x10 each leg   Education on HEP including handout (runners step up, standing hip abduction)  Manual therapy: to reduce pain and tissue tension, improve range of motion, neuromodulation, in order to promote improved ability to complete functional activities.    Asterisk Sign: Seated Figure 4 position with R LE    Before Mobs: limited and tension in right lateral hip    After Mobs: easier  time getting into figure 4 position, less tension in right lateral hip    4x45 seconds Grade III-IV lateral/caudal mobs to the Right hip using strap   Placed patient into more hip flexion with each bout of mobs.    Patient had improved motion and "feels a lot better"     PATIENT EDUCATION:  Education details: Education on diagnosis and prognosis, education on anatomy/physiology of current condition  Person educated: Patient Education method: Explanation Education comprehension: verbalized understanding and needs further education  HOME EXERCISE PROGRAM: Access Code: [email or web address] URL: [email or web address] Date: 07/06/2023 Prepared by: Rashad Obeid [place]  Exercises - Supine Lower Trunk Rotation  - 1 x daily - 2 sets - 10 reps - Supine Hamstring Stretch with Strap  - 1 x daily - 1 sets - 10 reps - 5 sec hold - Supine Bridge  - 3 x weekly - 2 sets - 10 reps - Squat with Chair Touch  - 3 x weekly - 2 sets - 5 reps - Seated Cat Cow  - 1 x daily - 2 sets - 10 reps - Quadruped Rocking Backward  - 1 x daily - 2 sets - 10 reps - 2-3 hold - Runner's Step Up/Down  - 3-4 x weekly - 2 sets - 10 reps - Standing Hip Abduction AROM  - 3-4 x weekly -  2 sets - 10 reps - Seated Piriformis Stretch  - 1 x daily - 2 sets - 30 seconds hold  ASSESSMENT:  CLINICAL IMPRESSION: Patient arrives to session with improved symptoms in his low back and bilateral lower extremities. The focus of today's session was to continue progressing exercises to promote lower extremity strength/endurance/power and improve lumbar spine/hip range of motion to improve tolerance to functional activities. Manual therapy including lateral/caudal mobilizations to the right hip with strap to reduce pain/tissue tension and increase range of motion. Patient demonstrated improved range of motion and described feeling more ease with movements following intervention. Patient had improved tolerance to seated cat/cow. Patient experienced  shortness of breath and needed to use his rescue inhaler twice during session but his breath returned quickly and he was able to continue with exercises after rest breaks. Patient tolerated interventions well and was able to complete all other exercises with no change in his pain. Patient provided updated HEP/handout to include seated cat/cow, quadruped rock backs, runners step up, standing hip abduction AROM, and seated piriformis stretch. Patient reports feeling good at end of session with awareness that he has exercised and feeling of fatigue in his legs but no change in pain. Patient would benefit from continued management of limiting condition by skilled physical therapist to address remaining impairments and functional limitations to work towards stated goals and return to PLOF or maximal functional independence.     OBJECTIVE IMPAIRMENTS: decreased activity tolerance, decreased endurance, decreased ROM, decreased strength, impaired sensation, and pain.   ACTIVITY LIMITATIONS: lifting, bending, standing, squatting, and sleeping  PARTICIPATION LIMITATIONS: yard work and putting on socks, picking objects off the floor  PERSONAL FACTORS: Past/current experiences, Time since onset of injury/illness/exacerbation, and 3+ comorbidities: Emphysema, Diabetes mellitus, Hyperlipidemia, GERD  are also affecting patient's functional outcome.   REHAB POTENTIAL: Good  CLINICAL DECISION MAKING: Evolving/moderate complexity  EVALUATION COMPLEXITY: Moderate   GOALS: Goals reviewed with patient? No  SHORT TERM GOALS: Target date: 06/28/2023  Patient will be independent with initial home exercise program for self-management of symptoms. Baseline: Initial HEP to be provided at visit 2 as appropriate (06/14/23); Goal status: In progress   LONG TERM GOALS: Target date: 09/06/2023  Patient will be independent with a long-term home exercise program for self-management of symptoms.  Baseline: Initial HEP to  be provided at visit 2 as appropriate (06/14/23); Goal status: In progress  2.  Patient will complete 5 Times Sit To Stand Test from 18.5 inch or lower surface without upper extremity support in equal or less than 15 seconds to demonstrate improvement in lower extremity power and strength to stand up from chair without using upper extremities.  Baseline: Will do 5 time sit to stand test at visit #2 (06/14/23); 14.91 seconds (06/20/23) Goal status: Met 06/20/2023  3.  Patient will demonstrate improved bilateral hip flexion AROM to 120 degrees to complete meaningful tasks such as putting on his socks with less difficulty.  Baseline: R/L Hip Flexion AROM 92/93 degrees (06/14/23); Goal status: In progress  4.  Patient will demonstrate improved lower extremity strength to 5/5 to complete functional activities such as prolonged standing and walking with less difficulty.  Baseline: Hip flexion/extension/abduction 4/5 bilaterally (06/14/23); Goal status: In progress  5.  Patient will demonstrate improvement in Patient Specific Functional Scale (PSFS) to equal or greater than 8/10 to reflect clinically significant improvement in patient's most valued functional activities. Baseline: PSFS to be completed at visit #2 (06/14/23); 7 (06/20/23) Goal status: In progress  6. Patient will demonstrate 10 squats to 17 inch chair without airex to complete functional activities such as standing up from a seated position with less difficulty.  Baseline: Able to perform 2x5 with airex pad on 17 inch chair with significant shortness of breath and leg fatigue (06/20/23)  Goal status: In progress   PLAN:  PT FREQUENCY: 1-2x/week  PT DURATION: 8-12 weeks  PLANNED INTERVENTIONS: 97164- PT Re-evaluation, 97110-Therapeutic exercises, 97530- Therapeutic activity, 97112- Neuromuscular re-education, 97535- Self Care, 40981- Manual therapy, [phone]- Electrical stimulation (manual), Patient/Family education, Dry Needling, Joint  mobilization, Joint manipulation, Spinal manipulation, Spinal mobilization, DME instructions, Cryotherapy, and Moist heat  PLAN FOR NEXT SESSION: Update HE as needed, hip range of motion, lumbar spine range of motion, lower extremity strengthening/endurance/power, manual therapy as needed.    Nissi Doffing [place], SPT [employer] DPTE  Huntley Dec R. Ilsa Iha, PT, DPT 07/06/23, 7:36 PM  [facility] [address] [place], [place] [address] P: [phone] I F: [phone]

## 2023-07-11 ENCOUNTER — Ambulatory Visit: Payer: Medicare HMO | Admitting: Physical Therapy

## 2023-07-11 NOTE — Therapy (Deleted)
 OUTPATIENT PHYSICAL THERAPY TREATMENT   Patient Name: Paul Stokes MRN: [id number] DOB:05/24/1955, 68 y.o., male Today's Date: 07/11/2023  END OF SESSION:      Past Medical History:  Diagnosis Date   Depression    Diabetes mellitus without complication (HCC)    type 1; insulin pump   Emphysema of lung (HCC)    GERD (gastroesophageal reflux disease)    Hyperlipidemia    Prostate disease    Past Surgical History:  Procedure Laterality Date   CATARACT EXTRACTION, BILATERAL     COLONOSCOPY  08/14/2013   7 tubular adenomas   COLONOSCOPY WITH PROPOFOL N/A 09/10/2019   Procedure: COLONOSCOPY WITH PROPOFOL;  Surgeon: Midge Minium, MD;  Location: [facility] ENDOSCOPY;  Service: Endoscopy;  Laterality: N/A;   COLONOSCOPY WITH PROPOFOL N/A 09/11/2019   Procedure: COLONOSCOPY WITH PROPOFOL;  Surgeon: Wyline Mood, MD;  Location: [facility] ENDOSCOPY;  Service: Gastroenterology;  Laterality: N/A;   HOLEP-LASER ENUCLEATION OF THE PROSTATE WITH MORCELLATION N/A 04/02/2021   Procedure: HOLEP-LASER ENUCLEATION OF THE PROSTATE WITH MORCELLATION;  Surgeon: Sondra Come, MD;  Location: [facility] ORS;  Service: Urology;  Laterality: N/A;   Patient Active Problem List   Diagnosis Date Noted   Greater trochanteric pain syndrome of right lower extremity 06/06/2023   Essential hypertension 05/03/2022   Major depression single episode, in partial remission (HCC) 05/03/2022   Chronic obstructive pulmonary disease (HCC) 05/02/2022   Benign essential tremor 07/26/2021   History of gross hematuria 01/05/2021   History of colonic polyps    Aortic atherosclerosis (HCC) 08/27/2019   BPH with obstruction/lower urinary tract symptoms 06/15/2015   Hyperlipidemia due to type 1 diabetes mellitus (HCC) 06/15/2015   Insomnia, persistent 03/06/2015   Compulsive tobacco user syndrome 03/06/2015   Type 1 diabetes mellitus with sensory neuropathy (HCC) 03/06/2015   Presence of insulin pump 06/08/2014    PCP: Reubin Milan, MD  REFERRING PROVIDER: Jerrol Banana, MD  REFERRING DIAG: Greater trochanteric pain syndrome of right lower extremity  THERAPY DIAG:  No diagnosis found.  Rationale for Evaluation and Treatment: Rehabilitation  ONSET DATE: Approximately 2 years ago  SUBJECTIVE:                                                                                                                                                                                      PERTINENT HISTORY: Patient is a 68 y.o. male who presents to outpatient physical therapy with a referral for medical diagnosis greater trochanteric pain syndrome of right lower extremity. This patient's chief complaints consist of bilateral low back pain and intermittent bilateral hip pain that radiates down the lower extremities, leading to the  following functional deficits: lumbar spine range of motion deficits, hip range of motion deficits, and glute muscle strength deficits. Relevant past medical history and comorbidities include emphysema of lungs, diabetes mellitus, GERD, and hyperlipidemia.    SUBJECTIVE STATEMENT:   Was sick with the flu last week - feeling better today  Not feeling any pain right now Pain in his legs comes and goes - hasn't felt leg pain in a couple days  Feels aching in his low back but not until the end of the day after a full day of activities  Aching in low back is gone in the morning after resting overnight Aching in low back does not keep him from sleeping  Hamstring stretch with the strap has been working for him - has helped him the most  HEP has been feeling good  Is on a diet and has lost 12 pounds  Has been easier the last couple weeks to put his socks and shoes on  Is able to bend over and pick things up easier as well    PAIN:  Are you having pain?  NPRS:  Current: Left Hip: 0/10, Right Hip: 0/10; slight "ache" in low back    FUNCTIONAL LIMITATIONS: putting on socks, picking objects off the  floor, lifting, standing    LEISURE: yard work, walking dog    PRECAUTIONS: None   WEIGHT BEARING RESTRICTIONS: No  FALLS:  Has patient fallen in last 6 months? Yes. Number of falls 1 - Fall occurred in November 2024. Patient was working at [employer] and tripped on something. Fell to floor but sustained no injuries.    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   PLOF: Independent  PATIENT GOALS: Be able to reach his foot and put on his sock   NEXT MD VISIT:   OBJECTIVE:   SELF- REPORTED FUNCTION Modified Oswestry Low Back Pain Disability Questionnaire (last measured 06/14/2023) Score: 14% (7/50)  SELF-REPORTED FUNCTION (last measured 06/20/23) Patient Specific Functional Scale (PSFS)  Tie shoes/put on socks: 7 Bend over/pick objects off floor: 7 Standing up from seated position: 7 Average: 7  FIVE TIME SIT TO STAND TEST: (last measured 06/20/23) From 18.5 inch plinth, hands across chest  Trial 1: 25 seconds  Trial 2 (cued to go as fast as can): 14.91 seconds  Comment: Pain improved after completing test. Legs feel tightened but not tired.                                                                                                                              TREATMENT:   Therapeutic exercise: therapeutic exercises that incorporate ONE parameter at one or more areas of the body to centralize symptoms, develop strength and endurance, range of motion, and flexibility required for successful completion of functional activities.    NuStep level 4 using bilateral upper and lower extremities. Seat/handle setting 9/9. For improved extremity mobility, muscular  endurance, and activity tolerance; and to induce the analgesic effect of aerobic exercise, stimulate improved joint nutrition, and prepare body structures and systems for following interventions. x 6  minutes. Average SPM = 72.    Seated Cat/Cow   1x10    Improved  tolerance to seated position.    Quadruped Rock Back with neutral spine    1x10 (before mobs)    Feels good stretch through low back    1x10 (after mobs)     Improved range of motion, was able to sit back closer to his heels   Seated Forward Flexion Ball Rollouts with Clear Theraball   1x10   Seated Piriformis Stretch    1x30 seconds (right leg only) Pulling knee in towards opposite shoulder was more tolerable than pushing knee away.   Education on HEP including handout (seated cat/cow, quad rock backs, seated piriformis stretch)  Therapeutic activities: dynamic therapeutic activities incorporating MULTIPLE parameters or areas of the body designed to achieve improved functional performance.   Squat taps to 17 inch chair with airex pad    1x10    Needed to use inhaler after first set   1x9   Pt discontinued due to shortness of breath   Runners Step Up on 6 inch step    1x10 each leg    1 min rest     Standing Hip Abduction (to improve hip strength/endurance/stability to increase standing/walking activity tolerance    2x10 each leg   Education on HEP including handout (runners step up, standing hip abduction)  Manual therapy: to reduce pain and tissue tension, improve range of motion, neuromodulation, in order to promote improved ability to complete functional activities.    Asterisk Sign: Seated Figure 4 position with R LE    Before Mobs: limited and tension in right lateral hip    After Mobs: easier time getting into figure 4 position, less tension in right lateral hip    4x45 seconds Grade III-IV lateral/caudal mobs to the Right hip using strap   Placed patient into more hip flexion with each bout of mobs.    Patient had improved motion and "feels a lot better"     PATIENT EDUCATION:  Education details: Education on diagnosis and prognosis, education on anatomy/physiology of current condition  Person educated: Patient Education method: Explanation Education  comprehension: verbalized understanding and needs further education  HOME EXERCISE PROGRAM: Access Code: [email or web address] URL: [email or web address] Date: 07/06/2023 Prepared by: Damarien Nyman [place]  Exercises - Supine Lower Trunk Rotation  - 1 x daily - 2 sets - 10 reps - Supine Hamstring Stretch with Strap  - 1 x daily - 1 sets - 10 reps - 5 sec hold - Supine Bridge  - 3 x weekly - 2 sets - 10 reps - Squat with Chair Touch  - 3 x weekly - 2 sets - 5 reps - Seated Cat Cow  - 1 x daily - 2 sets - 10 reps - Quadruped Rocking Backward  - 1 x daily - 2 sets - 10 reps - 2-3 hold - Runner's Step Up/Down  - 3-4 x weekly - 2 sets - 10 reps - Standing Hip Abduction AROM  - 3-4 x weekly - 2 sets - 10 reps - Seated Piriformis Stretch  - 1 x daily - 2 sets - 30 seconds hold  ASSESSMENT:  CLINICAL IMPRESSION:    Patient arrives to session with improved symptoms in his low back and bilateral lower extremities. The focus of today's session was  to continue progressing exercises to promote lower extremity strength/endurance/power and improve lumbar spine/hip range of motion to improve tolerance to functional activities. Manual therapy including lateral/caudal mobilizations to the right hip with strap to reduce pain/tissue tension and increase range of motion. Patient demonstrated improved range of motion and described feeling more ease with movements following intervention. Patient had improved tolerance to seated cat/cow. Patient experienced shortness of breath and needed to use his rescue inhaler twice during session but his breath returned quickly and he was able to continue with exercises after rest breaks. Patient tolerated interventions well and was able to complete all other exercises with no change in his pain. Patient provided updated HEP/handout to include seated cat/cow, quadruped rock backs, runners step up, standing hip abduction AROM, and seated piriformis stretch. Patient reports feeling good  at end of session with awareness that he has exercised and feeling of fatigue in his legs but no change in pain. Patient would benefit from continued management of limiting condition by skilled physical therapist to address remaining impairments and functional limitations to work towards stated goals and return to PLOF or maximal functional independence.     OBJECTIVE IMPAIRMENTS: decreased activity tolerance, decreased endurance, decreased ROM, decreased strength, impaired sensation, and pain.   ACTIVITY LIMITATIONS: lifting, bending, standing, squatting, and sleeping  PARTICIPATION LIMITATIONS: yard work and putting on socks, picking objects off the floor  PERSONAL FACTORS: Past/current experiences, Time since onset of injury/illness/exacerbation, and 3+ comorbidities: Emphysema, Diabetes mellitus, Hyperlipidemia, GERD  are also affecting patient's functional outcome.   REHAB POTENTIAL: Good  CLINICAL DECISION MAKING: Evolving/moderate complexity  EVALUATION COMPLEXITY: Moderate   GOALS: Goals reviewed with patient? No  SHORT TERM GOALS: Target date: 06/28/2023  Patient will be independent with initial home exercise program for self-management of symptoms. Baseline: Initial HEP to be provided at visit 2 as appropriate (06/14/23); Goal status: In progress   LONG TERM GOALS: Target date: 09/06/2023  Patient will be independent with a long-term home exercise program for self-management of symptoms.  Baseline: Initial HEP to be provided at visit 2 as appropriate (06/14/23); Goal status: In progress  2.  Patient will complete 5 Times Sit To Stand Test from 18.5 inch or lower surface without upper extremity support in equal or less than 15 seconds to demonstrate improvement in lower extremity power and strength to stand up from chair without using upper extremities.  Baseline: Will do 5 time sit to stand test at visit #2 (06/14/23); 14.91 seconds (06/20/23) Goal status: Met 06/20/2023  3.   Patient will demonstrate improved bilateral hip flexion AROM to 120 degrees to complete meaningful tasks such as putting on his socks with less difficulty.  Baseline: R/L Hip Flexion AROM 92/93 degrees (06/14/23); Goal status: In progress  4.  Patient will demonstrate improved lower extremity strength to 5/5 to complete functional activities such as prolonged standing and walking with less difficulty.  Baseline: Hip flexion/extension/abduction 4/5 bilaterally (06/14/23); Goal status: In progress  5.  Patient will demonstrate improvement in Patient Specific Functional Scale (PSFS) to equal or greater than 8/10 to reflect clinically significant improvement in patient's most valued functional activities. Baseline: PSFS to be completed at visit #2 (06/14/23); 7 (06/20/23) Goal status: In progress  6. Patient will demonstrate 10 squats to 17 inch chair without airex to complete functional activities such as standing up from a seated position with less difficulty.  Baseline: Able to perform 2x5 with airex pad on 17 inch chair with significant shortness of breath and  leg fatigue (06/20/23)  Goal status: In progress   PLAN:  PT FREQUENCY: 1-2x/week  PT DURATION: 8-12 weeks  PLANNED INTERVENTIONS: 97164- PT Re-evaluation, 97110-Therapeutic exercises, 97530- Therapeutic activity, 97112- Neuromuscular re-education, 97535- Self Care, 09811- Manual therapy, [phone]- Electrical stimulation (manual), Patient/Family education, Dry Needling, Joint mobilization, Joint manipulation, Spinal manipulation, Spinal mobilization, DME instructions, Cryotherapy, and Moist heat  PLAN FOR NEXT SESSION: Update HE as needed, hip range of motion, lumbar spine range of motion, lower extremity strengthening/endurance/power, manual therapy as needed.    Ayisha Pol [place], SPT [employer] DPTE  Huntley Dec R. Ilsa Iha, PT, DPT 07/11/23, 4:36 PM  [facility] [address] [place], [place]  [address] P: [phone] I F: [phone]

## 2023-07-13 ENCOUNTER — Other Ambulatory Visit: Payer: Self-pay | Admitting: Internal Medicine

## 2023-07-13 ENCOUNTER — Encounter: Payer: Self-pay | Admitting: Physical Therapy

## 2023-07-13 ENCOUNTER — Ambulatory Visit: Payer: Medicare HMO | Admitting: Physical Therapy

## 2023-07-13 DIAGNOSIS — M5459 Other low back pain: Secondary | ICD-10-CM

## 2023-07-13 DIAGNOSIS — M792 Neuralgia and neuritis, unspecified: Secondary | ICD-10-CM | POA: Diagnosis not present

## 2023-07-13 DIAGNOSIS — M25551 Pain in right hip: Secondary | ICD-10-CM

## 2023-07-13 DIAGNOSIS — I700 Atherosclerosis of aorta: Secondary | ICD-10-CM

## 2023-07-13 DIAGNOSIS — E1069 Type 1 diabetes mellitus with other specified complication: Secondary | ICD-10-CM

## 2023-07-13 DIAGNOSIS — M25552 Pain in left hip: Secondary | ICD-10-CM | POA: Diagnosis not present

## 2023-07-13 NOTE — Therapy (Signed)
 OUTPATIENT PHYSICAL THERAPY TREATMENT   Patient Name: Paul Stokes MRN: [id number] DOB:01-Jan-1956, 68 y.o., male Today's Date: 07/13/2023  END OF SESSION:  PT End of Session - 07/13/23 1656     Visit Number 4    Number of Visits 17    Date for PT Re-Evaluation 09/06/23    Authorization Type [employer] 2025 - reporting from 06/14/2023    PT Start Time 1651    PT Stop Time 1729    PT Time Calculation (min) 38 min    Activity Tolerance Patient tolerated treatment well;Patient limited by fatigue    Behavior During Therapy [facility] for tasks assessed/performed              Past Medical History:  Diagnosis Date   Depression    Diabetes mellitus without complication (HCC)    type 1; insulin pump   Emphysema of lung (HCC)    GERD (gastroesophageal reflux disease)    Hyperlipidemia    Prostate disease    Past Surgical History:  Procedure Laterality Date   CATARACT EXTRACTION, BILATERAL     COLONOSCOPY  08/14/2013   7 tubular adenomas   COLONOSCOPY WITH PROPOFOL N/A 09/10/2019   Procedure: COLONOSCOPY WITH PROPOFOL;  Surgeon: Midge Minium, MD;  Location: [facility] ENDOSCOPY;  Service: Endoscopy;  Laterality: N/A;   COLONOSCOPY WITH PROPOFOL N/A 09/11/2019   Procedure: COLONOSCOPY WITH PROPOFOL;  Surgeon: Wyline Mood, MD;  Location: [facility] ENDOSCOPY;  Service: Gastroenterology;  Laterality: N/A;   HOLEP-LASER ENUCLEATION OF THE PROSTATE WITH MORCELLATION N/A 04/02/2021   Procedure: HOLEP-LASER ENUCLEATION OF THE PROSTATE WITH MORCELLATION;  Surgeon: Sondra Come, MD;  Location: [facility] ORS;  Service: Urology;  Laterality: N/A;   Patient Active Problem List   Diagnosis Date Noted   Greater trochanteric pain syndrome of right lower extremity 06/06/2023   Essential hypertension 05/03/2022   Major depression single episode, in partial remission (HCC) 05/03/2022   Chronic obstructive pulmonary disease (HCC) 05/02/2022   Benign essential tremor 07/26/2021   History of gross hematuria  01/05/2021   History of colonic polyps    Aortic atherosclerosis (HCC) 08/27/2019   BPH with obstruction/lower urinary tract symptoms 06/15/2015   Hyperlipidemia due to type 1 diabetes mellitus (HCC) 06/15/2015   Insomnia, persistent 03/06/2015   Compulsive tobacco user syndrome 03/06/2015   Type 1 diabetes mellitus with sensory neuropathy (HCC) 03/06/2015   Presence of insulin pump 06/08/2014    PCP: Reubin Milan, MD  REFERRING PROVIDER: Jerrol Banana, MD  REFERRING DIAG: Greater trochanteric pain syndrome of right lower extremity  THERAPY DIAG:  Other low back pain  Pain of both hip joints  Neuralgia and neuritis  Rationale for Evaluation and Treatment: Rehabilitation  ONSET DATE: Approximately 2 years ago  SUBJECTIVE:  PERTINENT HISTORY: Patient is a 68 y.o. male who presents to outpatient physical therapy with a referral for medical diagnosis greater trochanteric pain syndrome of right lower extremity. This patient's chief complaints consist of bilateral low back pain and intermittent bilateral hip pain that radiates down the lower extremities, leading to the following functional deficits: lumbar spine range of motion deficits, hip range of motion deficits, and glute muscle strength deficits. Relevant past medical history and comorbidities include emphysema of lungs, diabetes mellitus, GERD, and hyperlipidemia.    SUBJECTIVE STATEMENT: Patient states he was sick last week and is not feeling great today. He has been feeling nauseated and his joints hurts. He states he has looked it up and Ozympic can do that. He has been taking Ozympic for the last 5 weeks and it has been wonderful on his blood sugar. He will be out of town all next week. He states all the people around him have been  symptomatic with flu like symptoms. He states his back has been feeling really good and the on pain he has now is in his posterior left proximal thigh that he thinks is from the exercises at last visit but not something he is really worried about.   PAIN:  Are you having pain?  NPRS:  Current: left posterior proximal thigh: 1/10.   FUNCTIONAL LIMITATIONS: putting on socks, picking objects off the floor, lifting, standing    LEISURE: yard work, walking dog    PRECAUTIONS: None   WEIGHT BEARING RESTRICTIONS: No  FALLS:  Has patient fallen in last 6 months? Yes. Number of falls 1 - Fall occurred in November 2024. Patient was working at [employer] and tripped on something. Fell to floor but sustained no injuries.    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   PLOF: Independent  PATIENT GOALS: Be able to reach his foot and put on his sock   NEXT MD VISIT:   OBJECTIVE:   SELF- REPORTED FUNCTION Modified Oswestry Low Back Pain Disability Questionnaire (last measured 06/14/2023) Score: 14% (7/50)  SELF-REPORTED FUNCTION (last measured 06/20/23) Patient Specific Functional Scale (PSFS)  Tie shoes/put on socks: 7 Bend over/pick objects off floor: 7 Standing up from seated position: 7 Average: 7  FIVE TIME SIT TO STAND TEST: (last measured 06/20/23) From 18.5 inch plinth, hands across chest  Trial 1: 25 seconds  Trial 2 (cued to go as fast as can): 14.91 seconds  Comment: Pain improved after completing test. Legs feel tightened but not tired.                                                                                                                              TREATMENT:   Therapeutic exercise: therapeutic exercises that incorporate ONE parameter at one or more areas of the body to centralize symptoms, develop strength and endurance, range of motion, and flexibility required for successful completion of functional  activities.    NuStep level 5 using bilateral upper and lower extremities. Seat/handle setting 9/9. For improved extremity mobility, muscular endurance, and activity tolerance; and to induce the analgesic effect of aerobic exercise, stimulate improved joint nutrition, and prepare body structures and systems for following interventions. x 5  minutes. Average SPM = 75.   (Manual therapy - see below)  Seated Piriformis Stretch    2x60 seconds each side Pushing knee away.   Seated Cat/Cow with hands on knees   1x10 plus more reps to improve form. Cuing to move from lumbar spine and keep elbows relatively extended.    quadruped Cat/Cow 1x10 plus more reps to improve form.  Multimodal cuing to improve lumbar motion Able to to do more consistently correctly by end of exercise.    Quadruped Rock Back with neutral spine    2x10   Improved after quadruped cat-cow Significant cuing for improved control of low back Felt stretch strongly in adductors and hips when doing correctly  Therapeutic activities: dynamic therapeutic activities incorporating MULTIPLE parameters or areas of the body designed to achieve improved functional performance.   Squat taps to 17 inch chair with airex pad    1x10  1 min rest   1x10   1 min rest 1x10   Quads burning by end of each set  Manual therapy: to reduce pain and tissue tension, improve range of motion, neuromodulation, in order to promote improved ability to complete functional activities.    Asterisk Sign: Seated Figure 4 position with R LE    Before Mobs: limited and tension in right lateral hip  After Mobs: easier time getting into figure 4 position, less tension in right lateral hip   3x45 seconds Grade III-IV lateral/caudal mobs to the Right hip using strap 1x30 seconds grade III-IV lateral/caudal mob to right hip with ER PROM using strap Placed patient into more hip flexion with each bout of mobs.    Patient had improved motion and feels  better after    PATIENT EDUCATION:  Education details: Education on diagnosis and prognosis, education on anatomy/physiology of current condition  Person educated: Patient Education method: Explanation Education comprehension: verbalized understanding and needs further education  HOME EXERCISE PROGRAM: Access Code: [email or web address] URL: [email or web address] Date: 07/06/2023 Prepared by: Sydney Swaz[place]  Exercises - Supine Lower Trunk Rotation  - 1 x daily - 2 sets - 10 reps - Supine Hamstring Stretch with Strap  - 1 x daily - 1 sets - 10 reps - 5 sec hold - Supine Bridge  - 3 x weekly - 2 sets - 10 reps - Squat with Chair Touch  - 3 x weekly - 2 sets - 5 reps - Seated Cat Cow  - 1 x daily - 2 sets - 10 reps - Quadruped Rocking Backward  - 1 x daily - 2 sets - 10 reps - 2-3 hold - Runner's Step Up/Down  - 3-4 x weekly - 2 sets - 10 reps - Standing Hip Abduction AROM  - 3-4 x weekly - 2 sets - 10 reps - Seated Piriformis Stretch  - 1 x daily - 2 sets - 30 seconds hold  ASSESSMENT:  CLINICAL IMPRESSION: Patient arrives with continued report of improved symptoms in the low back and ability to put on his socks and shoes. Today continued to focus on improving hip mobility and improving lumbopelvic control. Patient was able to progress to quadruped position for cat cow and again felt improved hip mobility after joint mobilizations. He  did not get as winded with squats and overall tolerated session better than last visit. Plan to continue working on hip/lumbar mobility and functional strength as tolerated. Patient would benefit from continued management of limiting condition by skilled physical therapist to address remaining impairments and functional limitations to work towards stated goals and return to PLOF or maximal functional independence.    OBJECTIVE IMPAIRMENTS: decreased activity tolerance, decreased endurance, decreased ROM, decreased strength, impaired sensation, and pain.    ACTIVITY LIMITATIONS: lifting, bending, standing, squatting, and sleeping  PARTICIPATION LIMITATIONS: yard work and putting on socks, picking objects off the floor  PERSONAL FACTORS: Past/current experiences, Time since onset of injury/illness/exacerbation, and 3+ comorbidities: Emphysema, Diabetes mellitus, Hyperlipidemia, GERD  are also affecting patient's functional outcome.   REHAB POTENTIAL: Good  CLINICAL DECISION MAKING: Evolving/moderate complexity  EVALUATION COMPLEXITY: Moderate   GOALS: Goals reviewed with patient? No  SHORT TERM GOALS: Target date: 06/28/2023  Patient will be independent with initial home exercise program for self-management of symptoms. Baseline: Initial HEP to be provided at visit 2 as appropriate (06/14/23); Goal status: In progress   LONG TERM GOALS: Target date: 09/06/2023  Patient will be independent with a long-term home exercise program for self-management of symptoms.  Baseline: Initial HEP to be provided at visit 2 as appropriate (06/14/23); Goal status: In progress  2.  Patient will complete 5 Times Sit To Stand Test from 18.5 inch or lower surface without upper extremity support in equal or less than 15 seconds to demonstrate improvement in lower extremity power and strength to stand up from chair without using upper extremities.  Baseline: Will do 5 time sit to stand test at visit #2 (06/14/23); 14.91 seconds (06/20/23) Goal status: Met 06/20/2023  3.  Patient will demonstrate improved bilateral hip flexion AROM to 120 degrees to complete meaningful tasks such as putting on his socks with less difficulty.  Baseline: R/L Hip Flexion AROM 92/93 degrees (06/14/23); Goal status: In progress  4.  Patient will demonstrate improved lower extremity strength to 5/5 to complete functional activities such as prolonged standing and walking with less difficulty.  Baseline: Hip flexion/extension/abduction 4/5 bilaterally (06/14/23); Goal status: In  progress  5.  Patient will demonstrate improvement in Patient Specific Functional Scale (PSFS) to equal or greater than 8/10 to reflect clinically significant improvement in patient's most valued functional activities. Baseline: PSFS to be completed at visit #2 (06/14/23); 7 (06/20/23) Goal status: In progress  6. Patient will demonstrate 10 squats to 17 inch chair without airex to complete functional activities such as standing up from a seated position with less difficulty.  Baseline: Able to perform 2x5 with airex pad on 17 inch chair with significant shortness of breath and leg fatigue (06/20/23)  Goal status: In progress   PLAN:  PT FREQUENCY: 1-2x/week  PT DURATION: 8-12 weeks  PLANNED INTERVENTIONS: 97164- PT Re-evaluation, 97110-Therapeutic exercises, 97530- Therapeutic activity, 97112- Neuromuscular re-education, 97535- Self Care, 60454- Manual therapy, [phone]- Electrical stimulation (manual), Patient/Family education, Dry Needling, Joint mobilization, Joint manipulation, Spinal manipulation, Spinal mobilization, DME instructions, Cryotherapy, and Moist heat  PLAN FOR NEXT SESSION: Update HE as needed, hip range of motion, lumbar spine range of motion, lower extremity strengthening/endurance/power, manual therapy as needed.   Luretha Murphy. Ilsa Iha, PT, DPT 07/13/23, 8:42 PM  [facility] [address] [place], [place] [address] P: [phone] I F: [phone]

## 2023-07-13 NOTE — Telephone Encounter (Unsigned)
 Copied from CRM [id number]. Topic: Clinical - Medication Refill >> Jul 13, 2023  2:31 PM Payton Doughty wrote: Most Recent Primary Care Visit:  Provider: Jerrol Banana  Department: [facility] [place]  Visit Type: NEW ORTHO  Date: 06/06/2023  Medication: pravastatin (PRAVACHOL) 40 MG tablet  Has the patient contacted their pharmacy? No Amy, health coach w/ Monia Pouch calling on pt's request to have this med, and all his meds when refilled changed to 100 day Rx Is this the correct pharmacy for this prescription? Yes  This is the patient's preferred pharmacy:  [facility] [address] [place] - [address] [place] [address] Phone: [phone] Fax: [phone]  Has the prescription been filled recently? Yes  Is the patient out of the medication? No  Has the patient been seen for an appointment in the last year OR does the patient have an upcoming appointment? Yes  Can we respond through MyChart? Yes  Amy  call back [phone]

## 2023-07-14 NOTE — Telephone Encounter (Signed)
  pravastatin (PRAVACHOL) 40 MG tablet 90 tablet 3 06/02/2023 --   Sig - Route: Take 1 tablet (40 mg total) by mouth daily. - Oral   Sent to pharmacy as: pravastatin (PRAVACHOL) 40 MG tablet   E-Prescribing Status: Receipt confirmed by pharmacy (06/02/2023  8:18 AM EST)      Requested Prescriptions  Pending Prescriptions Disp Refills   pravastatin (PRAVACHOL) 40 MG tablet 90 tablet 3    Sig: Take 1 tablet (40 mg total) by mouth daily.     Cardiovascular:  Antilipid - Statins Failed - 07/14/2023  3:34 PM      Failed - Lipid Panel in normal range within the last 12 months    Cholesterol, Total  Date Value Ref Range Status  06/02/2023 131 100 - 199 mg/dL Final   LDL Chol Calc (NIH)  Date Value Ref Range Status  06/02/2023 69 0 - 99 mg/dL Final   HDL  Date Value Ref Range Status  06/02/2023 45 >39 mg/dL Final   Triglycerides  Date Value Ref Range Status  06/02/2023 91 0 - 149 mg/dL Final         Passed - Patient is not pregnant      Passed - Valid encounter within last 12 months    Recent Outpatient Visits           1 month ago Greater trochanteric pain syndrome of right lower extremity   [place] [facility], Ocie Bob, MD   1 month ago Annual physical exam   [facility] at [facility], Nyoka Cowden, MD   1 month ago Right hip pain   [place] [facility] at [facility], Nyoka Cowden, MD   7 months ago Essential hypertension   [place] [facility] at [facility], Nyoka Cowden, MD   1 year ago Annual physical exam   [facility] at [facility], Nyoka Cowden, MD       Future Appointments             In 3 weeks Ashley Royalty, Ocie Bob, MD [facility] at [facility], [facility]

## 2023-07-20 ENCOUNTER — Ambulatory Visit: Payer: Medicare HMO | Admitting: Physical Therapy

## 2023-07-20 LAB — MICROALBUMIN / CREATININE URINE RATIO: Microalb Creat Ratio: 6.2

## 2023-07-20 LAB — HEMOGLOBIN A1C: Hemoglobin A1C: 7.5

## 2023-07-20 LAB — PROTEIN / CREATININE RATIO, URINE
Albumin, U: 11
Creatinine, Urine: 177

## 2023-07-25 ENCOUNTER — Ambulatory Visit: Payer: Medicare HMO | Attending: Family Medicine | Admitting: Physical Therapy

## 2023-07-26 ENCOUNTER — Telehealth: Payer: Self-pay

## 2023-07-26 NOTE — Telephone Encounter (Signed)
 Pt did not arrive for scheduled appointment. No telephone call nor message preceeded this absence. Author attempted to contact pt via telephone number listed in chart. Left a secure VM for this patient to call back.   11:27 AM, 07/26/23 Rosamaria Lints, PT, DPT Physical Therapist -  [phone] (Office)

## 2023-07-27 ENCOUNTER — Telehealth: Payer: Self-pay | Admitting: Physical Therapy

## 2023-07-27 ENCOUNTER — Ambulatory Visit (INDEPENDENT_AMBULATORY_CARE_PROVIDER_SITE_OTHER)

## 2023-07-27 ENCOUNTER — Encounter: Payer: Self-pay | Admitting: Emergency Medicine

## 2023-07-27 ENCOUNTER — Ambulatory Visit
Admission: EM | Admit: 2023-07-27 | Discharge: 2023-07-27 | Disposition: A | Discharge status: Home / Self Care | Attending: Family Medicine | Admitting: Family Medicine

## 2023-07-27 ENCOUNTER — Ambulatory Visit: Payer: Medicare HMO | Admitting: Physical Therapy

## 2023-07-27 DIAGNOSIS — E1040 Type 1 diabetes mellitus with diabetic neuropathy, unspecified: Secondary | ICD-10-CM | POA: Diagnosis not present

## 2023-07-27 DIAGNOSIS — R5383 Other fatigue: Secondary | ICD-10-CM | POA: Insufficient documentation

## 2023-07-27 DIAGNOSIS — E1059 Type 1 diabetes mellitus with other circulatory complications: Secondary | ICD-10-CM | POA: Diagnosis not present

## 2023-07-27 DIAGNOSIS — E782 Mixed hyperlipidemia: Secondary | ICD-10-CM | POA: Diagnosis not present

## 2023-07-27 DIAGNOSIS — R0602 Shortness of breath: Secondary | ICD-10-CM

## 2023-07-27 DIAGNOSIS — R0789 Other chest pain: Secondary | ICD-10-CM | POA: Diagnosis not present

## 2023-07-27 DIAGNOSIS — E10649 Type 1 diabetes mellitus with hypoglycemia without coma: Secondary | ICD-10-CM | POA: Diagnosis not present

## 2023-07-27 DIAGNOSIS — Z9641 Presence of insulin pump (external) (internal): Secondary | ICD-10-CM | POA: Diagnosis not present

## 2023-07-27 LAB — RESP PANEL BY RT-PCR (RSV, FLU A&B, COVID)  RVPGX2
Influenza A by PCR: NEGATIVE
Influenza B by PCR: NEGATIVE
Resp Syncytial Virus by PCR: NEGATIVE
SARS Coronavirus 2 by RT PCR: NEGATIVE

## 2023-07-27 NOTE — ED Provider Notes (Signed)
 [facility]    CSN: [id number] Arrival date & time: 07/27/23  1542      History   Chief Complaint Chief Complaint  Patient presents with   Shortness of Breath   chest tightness    Fatigue   Cough    HPI Paul Stokes is a 68 y.o. male  presents for evaluation of URI symptoms for 2 weeks.  Patient reports 2 weeks of fatigue, shortness of breath with exertion.  Denies shortness of breath at rest.  States he developed a dry cough today.  Does endorse some chest pain with coughing but denies any fevers, sore throat, congestion, body aches, lower extremity swelling, orthopnea.  He does have a history of COPD that was recently diagnosed and he was hospitalized at the time of diagnosis but no additional hospitalizations.  He denies any chest pain, dizziness, palpitations, diaphoresis, syncope.  He was recently started on Ozempic and is not sure if this is contributing to his fatigue.  He does have a history of type 1 diabetes, COPD, hypertension.  He spoke to his PCP who advised he come in for evaluation.  He feels the symptoms are related to him starting Ozempic.  He has not taken any OTC medications for symptoms.  No other concerns at this time.   Shortness of Breath Associated symptoms: cough   Cough Associated symptoms: shortness of breath     Past Medical History:  Diagnosis Date   Depression    Diabetes mellitus without complication (HCC)    type 1; insulin pump   Emphysema of lung (HCC)    GERD (gastroesophageal reflux disease)    Hyperlipidemia    Prostate disease     Patient Active Problem List   Diagnosis Date Noted   Greater trochanteric pain syndrome of right lower extremity 06/06/2023   Essential hypertension 05/03/2022   Major depression single episode, in partial remission (HCC) 05/03/2022   Chronic obstructive pulmonary disease (HCC) 05/02/2022   Benign essential tremor 07/26/2021   History of gross hematuria 01/05/2021   History of colonic polyps     Aortic atherosclerosis (HCC) 08/27/2019   BPH with obstruction/lower urinary tract symptoms 06/15/2015   Hyperlipidemia due to type 1 diabetes mellitus (HCC) 06/15/2015   Insomnia, persistent 03/06/2015   Compulsive tobacco user syndrome 03/06/2015   Type 1 diabetes mellitus with sensory neuropathy (HCC) 03/06/2015   Presence of insulin pump 06/08/2014    Past Surgical History:  Procedure Laterality Date   CATARACT EXTRACTION, BILATERAL     COLONOSCOPY  08/14/2013   7 tubular adenomas   COLONOSCOPY WITH PROPOFOL N/A 09/10/2019   Procedure: COLONOSCOPY WITH PROPOFOL;  Surgeon: Midge Minium, MD;  Location: [facility] ENDOSCOPY;  Service: Endoscopy;  Laterality: N/A;   COLONOSCOPY WITH PROPOFOL N/A 09/11/2019   Procedure: COLONOSCOPY WITH PROPOFOL;  Surgeon: Wyline Mood, MD;  Location: [facility] ENDOSCOPY;  Service: Gastroenterology;  Laterality: N/A;   HOLEP-LASER ENUCLEATION OF THE PROSTATE WITH MORCELLATION N/A 04/02/2021   Procedure: HOLEP-LASER ENUCLEATION OF THE PROSTATE WITH MORCELLATION;  Surgeon: Sondra Come, MD;  Location: [facility] ORS;  Service: Urology;  Laterality: N/A;       Home Medications    Prior to Admission medications   Medication Sig Start Date End Date Taking? Authorizing Provider  albuterol (PROVENTIL) (2.5 MG/3ML) 0.083% nebulizer solution TAKE 3 MLS BY NEBULIZATION EVERY 4 HOURSAS NEEDED FOR WHEEZING OR SHORTNESS OF BREATH 05/18/22   Reubin Milan, MD  albuterol (VENTOLIN HFA) 108 (90 Base) MCG/ACT inhaler Inhale  2 puffs into the lungs every 4 (four) hours as needed for wheezing or shortness of breath. 05/20/22   Reubin Milan, MD  aspirin EC 81 MG tablet Take 81 mg by mouth daily. Swallow whole.    [provider]  Baclofen 5 MG TABS Take 1 tablet (5 mg total) by mouth at bedtime as needed (muscle pain). 06/06/23   Jerrol Banana, MD  BAYER CONTOUR NEXT TEST test strip  01/01/15   [provider]  buPROPion [facility] SR) 150 MG 12 hr tablet  TAKE 1 TABLET BY MOUTH EVERY MORNING AND2 TABLETS AT BEDTIME 06/20/22   Reubin Milan, MD  celecoxib (CELEBREX) 50 MG capsule Take 1 capsule (50 mg total) by mouth 2 (two) times daily as needed for pain. 06/06/23   Jerrol Banana, MD  Continuous Blood Gluc Sensor (DEXCOM G6 SENSOR) MISC by Does not apply route.    [provider]  Cyanocobalamin 5000 MCG/ML LIQD Place under the tongue daily. Liquid Patient not taking: Reported on 06/06/2023    [provider]  Fluticasone-Umeclidin-Vilant (TRELEGY ELLIPTA) 200-62.5-25 MCG/ACT AEPB Inhale into the lungs.    [provider]  gabapentin (NEURONTIN) 300 MG capsule TAKE 2 CAPULES BY MOUTH THREE TIMES DAILY 04/20/23   Reubin Milan, MD  HUMALOG 100 UNIT/ML injection Inject 80 Units into the skin See admin instructions. 80 units per day through insulin pump 10/06/20   [provider]  losartan (COZAAR) 25 MG tablet TAKE ONE TABLET (25 MG) BY MOUTH EVERY DAY 04/20/23   Reubin Milan, MD  pravastatin (PRAVACHOL) 40 MG tablet Take 1 tablet (40 mg total) by mouth daily. 06/02/23   Reubin Milan, MD  Semaglutide,0.25 or 0.5MG /DOS, 2 MG/3ML SOPN Inject 0.25 mg weekly for two weeks and then 0.5 mg weekly. 05/31/23   [provider]  Spacer/Aero-Holding Chambers (AEROCHAMBER MV) inhaler Use as instructed 04/12/22   Domenick Gong, MD  VITAMIN D PO Take 5,000 Units by mouth daily.    [provider]  zolpidem (AMBIEN) 10 MG tablet TAKE 1 TABLET BY MOUTH NIGHTLY 02/26/23   Reubin Milan, MD    Family History Family History  Problem Relation Age of Onset   Diabetes Mother    Diabetes Father     Social History Social History   Tobacco Use   Smoking status: Former    Current packs/day: 0.00    Average packs/day: 0.3 packs/day for 53.9 years (13.5 ttl pk-yrs)    Types: Cigarettes    Start date: 05/12/1968    Quit date: 04/13/2022    Years since quitting: 1.2    Passive exposure: Past    Smokeless tobacco: Never  Vaping Use   Vaping status: Never Used  Substance Use Topics   Alcohol use: No    Alcohol/week: 0.0 standard drinks of alcohol   Drug use: No     Allergies   Lisinopril   Review of Systems Review of Systems  Constitutional:  Positive for fatigue.  Respiratory:  Positive for cough and shortness of breath.      Physical Exam Triage Vital Signs ED Triage Vitals  Encounter Vitals Group     BP 07/27/23 1605 (!) 148/82     Systolic BP Percentile --      Diastolic BP Percentile --      Pulse Rate 07/27/23 1605 88     Resp 07/27/23 1605 16     Temp 07/27/23 1605 97.6 F (36.4 C)  Temp Source 07/27/23 1605 Oral     SpO2 07/27/23 1605 98 %     Weight --      Height --      Head Circumference --      Peak Flow --      Pain Score 07/27/23 1604 0     Pain Loc --      Pain Education --      Exclude from Growth Chart --    No data found.  Updated Vital Signs BP (!) 148/82 (BP Location: Right Arm)   Pulse 88   Temp 97.6 F (36.4 C) (Oral)   Resp 16   SpO2 98%   Visual Acuity Right Eye Distance:   Left Eye Distance:   Bilateral Distance:    Right Eye Near:   Left Eye Near:    Bilateral Near:     Physical Exam Vitals and nursing note reviewed.  Constitutional:      General: He is not in acute distress.    Appearance: Normal appearance. He is not ill-appearing, toxic-appearing or diaphoretic.  HENT:     Head: Normocephalic and atraumatic.     Right Ear: Tympanic membrane and ear canal normal.     Left Ear: Tympanic membrane and ear canal normal.     Nose: No congestion or rhinorrhea.     Mouth/Throat:     Mouth: Mucous membranes are moist.     Pharynx: No oropharyngeal exudate or posterior oropharyngeal erythema.  Eyes:     Pupils: Pupils are equal, round, and reactive to light.  Cardiovascular:     Rate and Rhythm: Normal rate and regular rhythm.     Heart sounds: Normal heart sounds.  Pulmonary:     Effort: Pulmonary  effort is normal.     Breath sounds: Normal breath sounds. No wheezing or rhonchi.  Musculoskeletal:     Cervical back: Normal range of motion and neck supple.  Lymphadenopathy:     Cervical: No cervical adenopathy.  Skin:    General: Skin is warm and dry.  Neurological:     General: No focal deficit present.     Mental Status: He is alert and oriented to person, place, and time.  Psychiatric:        Mood and Affect: Mood normal.        Behavior: Behavior normal.      UC Treatments / Results  Labs (all labs ordered are listed, but only abnormal results are displayed) Labs Reviewed  RESP PANEL BY RT-PCR (RSV, FLU A&B, COVID)  [id number]    EKG   Radiology No results found.  Procedures ED EKG  Date/Time: 07/27/2023 4:50 PM  Performed by: Radford Pax, NP Authorized by: Radford Pax, NP   ECG interpreted by ED Physician in the absence of a cardiologist: no   Previous ECG:    Previous ECG:  Compared to current Rate:    ECG rate:  82   ECG rate assessment: normal   Rhythm:    Rhythm: sinus rhythm   Ectopy:    Ectopy: none   QRS:    QRS axis:  Normal ST segments:    ST segments:  Normal T waves:    T waves: flattening     Flattening:  V4, V5 and V6 Q waves:    Abnormal Q-waves: not present   Comments:     When compared to EKG from December 2023 EKG improved.  Flat T waves noted on the 2023 EKG  as well.  (including critical care time)  Medications Ordered in UC Medications - No data to display  Initial Impression / Assessment and Plan / UC Course  I have reviewed the triage vital signs and the nursing notes.  Pertinent labs & imaging results that were available during my care of the patient were reviewed by me and considered in my medical decision making (see chart for details).     I reviewed exam and symptoms with patient.  EKG with no acute ST-T wave changes.  Negative COVID flu and RSV PCR.  Wet read of x-ray without obvious consolidation.  Will  contact for any positive results based on radiology overread.  Of note patient did have a CTA calcium score in June 2024 showing a score of 32.4.  I did discuss with patient that shortness of breath with exertion and fatigue could be a sign of cardiac involvement and discussed ER evaluation but he declined at this time.  He thinks that his symptoms are related to the Ozempic that he increased to the 1 mg dosage 2 weeks ago when his symptoms started.  He states he is discussed this with his PCP and is going to stop it to see if his symptoms improve.  Discussed steroids for possible COPD exacerbation but he declined again wanting to see if his symptoms resolve after stopping the Ozempic.  I did advise for him to follow-up with his PCP tomorrow for recheck and did review strict ER precautions with him and he verbalized understanding.  He was discharged in stable condition and in no acute distress. Final Clinical Impressions(s) / UC Diagnoses   Final diagnoses:  Shortness of breath  Other fatigue     Discharge Instructions      Please follow-up with your PCP tomorrow for further workup of your symptoms.  Please go to the ER ASAP if you develop any worsening symptoms or new symptoms.  This includes but is not limited to worsening shortness of breath, chest pain, worsening fatigue, nausea or vomiting, dizziness, palpitations, or any new concerns that arise.  Hope you feel better soon!     ED Prescriptions   None    PDMP not reviewed this encounter.   Radford Pax, NP 07/27/23 [phone]

## 2023-07-27 NOTE — ED Triage Notes (Signed)
 Pt presents with SOB, fatigue and chest tightness x 2 weeks. Pt developed some cough today.

## 2023-07-27 NOTE — Discharge Instructions (Signed)
 Please follow-up with your PCP tomorrow for further workup of your symptoms.  Please go to the ER ASAP if you develop any worsening symptoms or new symptoms.  This includes but is not limited to worsening shortness of breath, chest pain, worsening fatigue, nausea or vomiting, dizziness, palpitations, or any new concerns that arise.  Hope you feel better soon!

## 2023-07-27 NOTE — Telephone Encounter (Signed)
 LVM notifying patient of missed PT visit scheduled at 10:30 today. Requested patient call back to reschedule, confirm next appointment, or let us know of any changes in PT plans. Let patient know that with any no-show I am required to review our cancellation policy that after 2 no-shows we cannot schedule more than 1 week at a time and/or we may remove a patient from the schedule.   Luretha Murphy. Ilsa Iha, PT, DPT 07/27/23, 10:57 AM  [facility] [address] [place], [place] [address] P: [phone] I F: [phone]

## 2023-07-31 ENCOUNTER — Ambulatory Visit: Payer: Medicare HMO | Admitting: Physical Therapy

## 2023-08-02 ENCOUNTER — Ambulatory Visit: Payer: Medicare HMO | Admitting: Physical Therapy

## 2023-08-04 ENCOUNTER — Encounter: Payer: Self-pay | Admitting: Family Medicine

## 2023-08-04 ENCOUNTER — Ambulatory Visit (INDEPENDENT_AMBULATORY_CARE_PROVIDER_SITE_OTHER): Payer: Medicare HMO | Admitting: Family Medicine

## 2023-08-04 DIAGNOSIS — M25551 Pain in right hip: Secondary | ICD-10-CM | POA: Diagnosis not present

## 2023-08-04 MED ORDER — CELECOXIB 50 MG PO CAPS: 50.0000 mg | ORAL_CAPSULE | Freq: Two times a day (BID) | ORAL | 1 refills | Status: DC | PRN

## 2023-08-04 MED ORDER — BACLOFEN 5 MG PO TABS: 1.0000 | ORAL_TABLET | Freq: Every evening | ORAL | 1 refills | Status: DC | PRN

## 2023-08-04 NOTE — Progress Notes (Signed)
 Primary Care / Sports Medicine Office Visit  Patient Information:  Patient ID: Paul Stokes, male DOB: [birth year] Age: 68 y.o. MRN: [id number]   Paul Stokes is a pleasant 68 y.o. male presenting with the following:  Chief Complaint  Patient presents with   Hip Pain    Follow up on R Hip. PT has been helping. He has been taking the celebrex and baclofen PRN. He is dong well today.     Vitals:   08/04/23 0908  BP: 122/74  Pulse: 78  Resp: 15  SpO2: 97%   Vitals:   08/04/23 0908  Weight: 189 lb (85.7 kg)  Height: 5\' 7"  (1.702 m)   Body mass index is 29.6 kg/m.  DG Chest 2 View Result Date: 07/27/2023 CLINICAL DATA:  Shortness of breath and chest tightness EXAM: CHEST - 2 VIEW COMPARISON:  Chest radiograph 05/02/2022 FINDINGS: The heart size and mediastinal contours are within normal limits. Both lungs are clear. The visualized skeletal structures are unremarkable. IMPRESSION: No active cardiopulmonary disease. Electronically Signed   By: Annia Belt M.D.   On: 07/27/2023 20:01     Independent interpretation of notes and tests performed by another provider:   None  Procedures performed:   None  Pertinent History, Exam, Impression, and Recommendations:   Problem List Items Addressed This Visit     Greater trochanteric pain syndrome of right lower extremity   History of Present Illness Paul Stokes is a 68 year old male who presents for follow-up of hip pain and medication management.  He had experienced sciatica and low back pain, with the primary area of discomfort being the outer right hip, identified as the greater trochanter. He describes this as a 'hot spot'. No current pain in the hip or back, and no pins, needles, numbness, or tingling in the foot. Only stretching sensations during certain movements.  He has been undergoing physical therapy with Illene Labrador, which he finds beneficial. He missed a session last week due to travel for work. He mentions prior  difficulty with activities such as putting on socks, which has improved with exercises learned in physical therapy. He does not exercise daily but incorporates exercises in the morning before dressing. He is satisfied with his progress, noting that he can now put on socks without difficulty.  For pain management, he has been prescribed Celebrex and baclofen. He finds the combination effective, using it primarily at night to manage pain, and notes that he does not rely on it daily. He still has a ten-day supply remaining, indicating infrequent use. He appreciates having the medication available for when pain is more pronounced.  Physical Exam PALPATION: No tenderness to palpation at the greater trochanter on the right and right SI joint. SPECIAL TESTS: Negative straight leg raise, piriformis, FADIR, and FABER tests. Resisted right hip flexion is benign.  Assessment and Plan Greater trochanteric pain syndrome - improved/resolved Experiencing sciatica-type symptoms and low back pain, with primary focality of pain at the greater trochanter. Condition improved significantly with physical therapy and medications. No tenderness or positive tests on examination today. Uses medications as needed, mainly at night. Proactive with home exercises. - Continue physical therapy and provide feedback to therapist regarding working towards a fully home-based rehab program. - Send refill of prescriptions for Celebrex and baclofen for use as needed. - Encourage home exercises and consider reducing physical therapy frequency.      Relevant Medications   celecoxib (CELEBREX) 50 MG capsule  Baclofen 5 MG TABS     Orders & Medications Medications:  Meds ordered this encounter  Medications   celecoxib (CELEBREX) 50 MG capsule    Sig: Take 1 capsule (50 mg total) by mouth 2 (two) times daily as needed for pain.    Dispense:  60 capsule    Refill:  1   Baclofen 5 MG TABS    Sig: Take 1 tablet (5 mg total) by  mouth at bedtime as needed (muscle pain).    Dispense:  30 tablet    Refill:  1   No orders of the defined types were placed in this encounter.    No follow-ups on file.     Jerrol Banana, MD, [facility]   Primary Care Sports Medicine Primary Care and Sports Medicine at [facility]

## 2023-08-04 NOTE — Assessment & Plan Note (Signed)
 History of Present Illness Paul Stokes is a 68 year old male who presents for follow-up of hip pain and medication management.  He had experienced sciatica and low back pain, with the primary area of discomfort being the outer right hip, identified as the greater trochanter. He describes this as a 'hot spot'. No current pain in the hip or back, and no pins, needles, numbness, or tingling in the foot. Only stretching sensations during certain movements.  He has been undergoing physical therapy with Illene Labrador, which he finds beneficial. He missed a session last week due to travel for work. He mentions prior difficulty with activities such as putting on socks, which has improved with exercises learned in physical therapy. He does not exercise daily but incorporates exercises in the morning before dressing. He is satisfied with his progress, noting that he can now put on socks without difficulty.  For pain management, he has been prescribed Celebrex and baclofen. He finds the combination effective, using it primarily at night to manage pain, and notes that he does not rely on it daily. He still has a ten-day supply remaining, indicating infrequent use. He appreciates having the medication available for when pain is more pronounced.  Physical Exam PALPATION: No tenderness to palpation at the greater trochanter on the right and right SI joint. SPECIAL TESTS: Negative straight leg raise, piriformis, FADIR, and FABER tests. Resisted right hip flexion is benign.  Assessment and Plan Greater trochanteric pain syndrome - improved/resolved Experiencing sciatica-type symptoms and low back pain, with primary focality of pain at the greater trochanter. Condition improved significantly with physical therapy and medications. No tenderness or positive tests on examination today. Uses medications as needed, mainly at night. Proactive with home exercises. - Continue physical therapy and provide feedback to  therapist regarding working towards a fully home-based rehab program. - Send refill of prescriptions for Celebrex and baclofen for use as needed. - Encourage home exercises and consider reducing physical therapy frequency.

## 2023-08-04 NOTE — Patient Instructions (Signed)
 Patient Care Plan  Greater Trochanteric Pain Syndrome  1. Continue with physical therapy sessions. Provide feedback to your therapist about transitioning to a fully home-based rehab program. 2. Refill prescriptions for Celebrex and baclofen. Use these medications as needed, mainly at night. 3. Maintain and regularly perform your home exercise routine. 4. Consider reducing the frequency of physical therapy sessions as you progress.  Red Flags  - If you experience a return or worsening of symptoms, such as increased pain or difficulty moving, contact your healthcare provider.

## 2023-08-07 ENCOUNTER — Ambulatory Visit: Payer: Medicare HMO | Admitting: Physical Therapy

## 2023-08-09 ENCOUNTER — Encounter: Payer: Medicare HMO | Admitting: Physical Therapy

## 2023-08-14 ENCOUNTER — Encounter: Payer: Medicare HMO | Admitting: Physical Therapy

## 2023-08-16 ENCOUNTER — Encounter: Payer: Medicare HMO | Admitting: Physical Therapy

## 2023-08-19 ENCOUNTER — Other Ambulatory Visit: Payer: Self-pay | Admitting: Internal Medicine

## 2023-08-19 DIAGNOSIS — G4700 Insomnia, unspecified: Secondary | ICD-10-CM

## 2023-08-21 ENCOUNTER — Encounter: Payer: Medicare HMO | Admitting: Physical Therapy

## 2023-08-21 ENCOUNTER — Other Ambulatory Visit: Payer: Self-pay | Admitting: Internal Medicine

## 2023-08-21 DIAGNOSIS — G4700 Insomnia, unspecified: Secondary | ICD-10-CM

## 2023-08-21 MED ORDER — ZOLPIDEM TARTRATE 10 MG PO TABS: 10.0000 mg | ORAL_TABLET | Freq: Every day | ORAL | 5 refills | Status: DC

## 2023-08-21 NOTE — Telephone Encounter (Signed)
 Is it okay to send refill of Ambien?   JM

## 2023-08-23 ENCOUNTER — Encounter: Payer: Medicare HMO | Admitting: Physical Therapy

## 2023-08-28 ENCOUNTER — Encounter: Payer: Medicare HMO | Admitting: Physical Therapy

## 2023-08-30 ENCOUNTER — Encounter: Payer: Medicare HMO | Admitting: Physical Therapy

## 2023-09-04 ENCOUNTER — Encounter: Payer: Medicare HMO | Admitting: Physical Therapy

## 2023-09-06 ENCOUNTER — Encounter: Payer: Medicare HMO | Admitting: Physical Therapy

## 2023-09-11 ENCOUNTER — Encounter: Payer: Medicare HMO | Admitting: Physical Therapy

## 2023-09-13 ENCOUNTER — Encounter: Payer: Self-pay | Admitting: Internal Medicine

## 2023-09-13 ENCOUNTER — Encounter: Payer: Medicare HMO | Admitting: Physical Therapy

## 2023-09-14 ENCOUNTER — Encounter: Payer: Self-pay | Admitting: Family Medicine

## 2023-09-14 ENCOUNTER — Ambulatory Visit (INDEPENDENT_AMBULATORY_CARE_PROVIDER_SITE_OTHER): Admitting: Family Medicine

## 2023-09-14 VITALS — BP 122/70 | HR 89 | Ht 67.0 in | Wt 191.0 lb

## 2023-09-14 DIAGNOSIS — L02213 Cutaneous abscess of chest wall: Secondary | ICD-10-CM | POA: Diagnosis not present

## 2023-09-14 MED ORDER — SULFAMETHOXAZOLE-TRIMETHOPRIM 800-160 MG PO TABS: 1.0000 | ORAL_TABLET | Freq: Two times a day (BID) | ORAL | 0 refills | Status: AC

## 2023-09-14 NOTE — Progress Notes (Signed)
     Primary Care / Sports Medicine Office Visit  Patient Information:  Patient ID: Paul Stokes, male DOB: 1955/10/26 Age: 68 y.o. MRN: [id number]   Paul Stokes is a pleasant 68 y.o. male presenting with the following:  Chief Complaint  Patient presents with   Cyst    Patient has cyst on right side of mid back    Vitals:   09/14/23 1108  BP: 122/70  Pulse: 89  SpO2: 96%   Vitals:   09/14/23 1108  Weight: 191 lb (86.6 kg)  Height: 5\' 7"  (1.702 m)   Body mass index is 29.91 kg/m.  No results found.   Independent interpretation of notes and tests performed by another provider:   None  Procedures performed:   None  Pertinent History, Exam, Impression, and Recommendations:   Problem List Items Addressed This Visit     Abscess of parasternal chest wall - Primary   History of Present Illness Paul Stokes is a 69 year old male with diabetes who presents with a recurrent abscess on his back.  He experiences significant back pain over the past week, with a bump that burst, releasing a grayish, foul-smelling substance mixed with blood. The swelling recurs by evening, requiring repeated drainage. Pain is intense, especially in the evenings, affecting his ability to sit still.  There is a history of a persistent knot in the same area, with similar drainage episodes in the past.   Physical Exam SKIN: Tense skin on the back with a abscess. Indurated feel without overt fluctuance. See image:   Media Information  Document Information  Photos  Back  09/14/2023 11:35  Attached To:  Office Visit on 09/14/23 with Ma Saupe, MD  Source Information  Ma Saupe, MD  [facility]  Document History     Media Information  Document Information  Photos  Right lateral midback  09/14/2023 11:34  Attached To:  Office Visit on 09/14/23 with Ma Saupe, MD  Source Information  Ma Saupe, MD  [facility]  Document History      Assessment and Plan Abscess on back Acute abscess with recurrent swelling and drainage, likely bacterial infection, possibly exacerbated by manipulation and elevated blood glucose. Differential includes sebaceous or epidermoid cyst with secondary infection. No overt fluctuance to pursue I&D. - Prescribed oral Bactrim -DS for 5 days, extend if necessary. - Advised against manipulation to prevent infection. - Instructed on daily cleaning with soap and water, use gauze if abscess opens. - Recommended warm compresses. - Discussed antibiotic side effects, advised probiotic if needed. - Referred to dermatology for evaluation given chronicity and recurrence history endorsed.      Relevant Medications   sulfamethoxazole -trimethoprim  (BACTRIM  DS) 800-160 MG tablet     Orders & Medications Medications:  Meds ordered this encounter  Medications   sulfamethoxazole -trimethoprim  (BACTRIM  DS) 800-160 MG tablet    Sig: Take 1 tablet by mouth 2 (two) times daily for 5 days.    Dispense:  10 tablet    Refill:  0   No orders of the defined types were placed in this encounter.    No follow-ups on file.     Ma Saupe, MD, [facility]   Primary Care Sports Medicine Primary Care and Sports Medicine at [facility]

## 2023-09-14 NOTE — Patient Instructions (Signed)
 Patient Plan  Abscess on Back: 1. Take Bactrim -DS as prescribed for 5 days; extend if advised by your doctor. 2. Avoid touching or manipulating the abscess to prevent further infection. 3. Clean the area daily with soap and water; use gauze if the abscess opens. 4. Apply warm compresses to the area. 5. Be aware of possible antibiotic side effects; consider taking a probiotic if needed. 6. Follow up with dermatology for evaluation due to the chronic and recurring nature of the abscess.  Red Flags: - Contact your healthcare provider if you experience increased redness, swelling, pain, fever, or if the abscess does not improve.

## 2023-09-14 NOTE — Assessment & Plan Note (Addendum)
 History of Present Illness Paul Stokes is a 68 year old male with diabetes who presents with a recurrent abscess on his back.  He experiences significant back pain over the past week, with a bump that burst, releasing a grayish, foul-smelling substance mixed with blood. The swelling recurs by evening, requiring repeated drainage. Pain is intense, especially in the evenings, affecting his ability to sit still.  There is a history of a persistent knot in the same area, with similar drainage episodes in the past.   Physical Exam SKIN: Tense skin on the back with a abscess. Indurated feel without overt fluctuance. See image:   Media Information  Document Information  Photos  Back  09/14/2023 11:35  Attached To:  Office Visit on 09/14/23 with Ma Saupe, MD  Source Information  Ma Saupe, MD  [facility]  Document History     Media Information  Document Information  Photos  Right lateral midback  09/14/2023 11:34  Attached To:  Office Visit on 09/14/23 with Ma Saupe, MD  Source Information  Ma Saupe, MD  [facility]  Document History     Assessment and Plan Abscess on back Acute abscess with recurrent swelling and drainage, likely bacterial infection, possibly exacerbated by manipulation and elevated blood glucose. Differential includes sebaceous or epidermoid cyst with secondary infection. No overt fluctuance to pursue I&D. - Prescribed oral Bactrim -DS for 5 days, extend if necessary. - Advised against manipulation to prevent infection. - Instructed on daily cleaning with soap and water, use gauze if abscess opens. - Recommended warm compresses. - Discussed antibiotic side effects, advised probiotic if needed. - Referred to dermatology for evaluation given chronicity and recurrence history endorsed.

## 2023-09-24 ENCOUNTER — Other Ambulatory Visit: Payer: Self-pay | Admitting: Family Medicine

## 2023-09-24 DIAGNOSIS — M25551 Pain in right hip: Secondary | ICD-10-CM

## 2023-09-25 ENCOUNTER — Other Ambulatory Visit: Payer: Self-pay | Admitting: Family Medicine

## 2023-09-25 DIAGNOSIS — M25551 Pain in right hip: Secondary | ICD-10-CM

## 2023-09-26 NOTE — Telephone Encounter (Signed)
 Requested Prescriptions  Pending Prescriptions Disp Refills   celecoxib  (CELEBREX ) 50 MG capsule [Pharmacy Med Name: Celecoxib  50 MG Oral Capsule] 60 capsule 0    Sig: TAKE 1 CAPSULE BY MOUTH TWICE DAILY AS NEEDED FOR PAIN     Analgesics:  COX2 Inhibitors Failed - 09/26/2023 11:37 AM      Failed - Manual Review: Labs are only required if the patient has taken medication for more than 8 weeks.      Passed - HGB in normal range and within 360 days    Hemoglobin  Date Value Ref Range Status  06/02/2023 14.5 13.0 - 17.7 g/dL Final         Passed - Cr in normal range and within 360 days    Creatinine, Ser  Date Value Ref Range Status  06/02/2023 1.05 0.76 - 1.27 mg/dL Final   Creatinine, Urine  Date Value Ref Range Status  07/20/2023 177  Final         Passed - HCT in normal range and within 360 days    Hematocrit  Date Value Ref Range Status  06/02/2023 44.5 37.5 - 51.0 % Final         Passed - AST in normal range and within 360 days    AST  Date Value Ref Range Status  06/02/2023 19 0 - 40 IU/L Final         Passed - ALT in normal range and within 360 days    ALT  Date Value Ref Range Status  06/02/2023 19 0 - 44 IU/L Final         Passed - eGFR is 30 or above and within 360 days    GFR calc Af Amer  Date Value Ref Range Status  09/29/2019 >60 >60 mL/min Final   GFR, Estimated  Date Value Ref Range Status  05/03/2022 >60 >60 mL/min Final    Comment:    (NOTE) Calculated using the CKD-EPI Creatinine Equation (2021)    eGFR  Date Value Ref Range Status  06/02/2023 77 >59 mL/min/1.73 Final         Passed - Patient is not pregnant      Passed - Valid encounter within last 12 months    Recent Outpatient Visits           1 week ago Abscess of parasternal chest wall   [facility] at [facility] Ledger, Dessie Flow, MD   1 month ago Greater trochanteric pain syndrome of right lower extremity   [facility] Primary Care &  Sports Medicine at [facility], Dessie Flow, MD

## 2023-09-27 NOTE — Telephone Encounter (Signed)
 Requested Prescriptions  Pending Prescriptions Disp Refills   Baclofen  5 MG TABS [Pharmacy Med Name: Baclofen  5 MG Oral Tablet] 90 tablet 0    Sig: TAKE 1 TABLET BY MOUTH AT BEDTIME AS NEEDED FOR  MUSCLE  PAIN     Analgesics:  Muscle Relaxants - baclofen  Passed - 09/27/2023  2:26 PM      Passed - Cr in normal range and within 180 days    Creatinine, Ser  Date Value Ref Range Status  06/02/2023 1.05 0.76 - 1.27 mg/dL Final   Creatinine, Urine  Date Value Ref Range Status  07/20/2023 177  Final         Passed - eGFR is 30 or above and within 180 days    GFR calc Af Amer  Date Value Ref Range Status  09/29/2019 >60 >60 mL/min Final   GFR, Estimated  Date Value Ref Range Status  05/03/2022 >60 >60 mL/min Final    Comment:    (NOTE) Calculated using the CKD-EPI Creatinine Equation (2021)    eGFR  Date Value Ref Range Status  06/02/2023 77 >59 mL/min/1.73 Final         Passed - Valid encounter within last 6 months    Recent Outpatient Visits           1 week ago Abscess of parasternal chest wall   [facility] at [facility], Dessie Flow, MD   1 month ago Greater trochanteric pain syndrome of right lower extremity   [facility] Care & Sports Medicine at [facility], Dessie Flow, MD

## 2023-10-11 DIAGNOSIS — M48062 Spinal stenosis, lumbar region with neurogenic claudication: Secondary | ICD-10-CM | POA: Diagnosis not present

## 2023-10-11 DIAGNOSIS — M5416 Radiculopathy, lumbar region: Secondary | ICD-10-CM | POA: Diagnosis not present

## 2023-10-24 ENCOUNTER — Other Ambulatory Visit: Payer: Self-pay | Admitting: Internal Medicine

## 2023-10-24 DIAGNOSIS — M5416 Radiculopathy, lumbar region: Secondary | ICD-10-CM | POA: Diagnosis not present

## 2023-10-24 DIAGNOSIS — M48062 Spinal stenosis, lumbar region with neurogenic claudication: Secondary | ICD-10-CM | POA: Diagnosis not present

## 2023-10-24 DIAGNOSIS — M25551 Pain in right hip: Secondary | ICD-10-CM

## 2023-10-24 NOTE — Telephone Encounter (Signed)
 Requested Prescriptions  Pending Prescriptions Disp Refills   celecoxib  (CELEBREX ) 50 MG capsule [Pharmacy Med Name: Celecoxib  50 MG Oral Capsule] 60 capsule 0    Sig: TAKE 1 CAPSULE BY MOUTH TWICE DAILY AS NEEDED FOR PAIN     Analgesics:  COX2 Inhibitors Failed - 10/24/2023  5:13 PM      Failed - Manual Review: Labs are only required if the patient has taken medication for more than 8 weeks.      Passed - HGB in normal range and within 360 days    Hemoglobin  Date Value Ref Range Status  06/02/2023 14.5 13.0 - 17.7 g/dL Final         Passed - Cr in normal range and within 360 days    Creatinine, Ser  Date Value Ref Range Status  06/02/2023 1.05 0.76 - 1.27 mg/dL Final   Creatinine, Urine  Date Value Ref Range Status  07/20/2023 177  Final         Passed - HCT in normal range and within 360 days    Hematocrit  Date Value Ref Range Status  06/02/2023 44.5 37.5 - 51.0 % Final         Passed - AST in normal range and within 360 days    AST  Date Value Ref Range Status  06/02/2023 19 0 - 40 IU/L Final         Passed - ALT in normal range and within 360 days    ALT  Date Value Ref Range Status  06/02/2023 19 0 - 44 IU/L Final         Passed - eGFR is 30 or above and within 360 days    GFR calc Af Amer  Date Value Ref Range Status  09/29/2019 >60 >60 mL/min Final   GFR, Estimated  Date Value Ref Range Status  05/03/2022 >60 >60 mL/min Final    Comment:    (NOTE) Calculated using the CKD-EPI Creatinine Equation (2021)    eGFR  Date Value Ref Range Status  06/02/2023 77 >59 mL/min/1.73 Final         Passed - Patient is not pregnant      Passed - Valid encounter within last 12 months    Recent Outpatient Visits           1 month ago Abscess of parasternal chest wall   [facility] at [facility] Ledger, Dessie Flow, MD   2 months ago Greater trochanteric pain syndrome of right lower extremity   [facility] Primary Care &  Sports Medicine at [facility], Dessie Flow, MD

## 2023-10-26 LAB — PROTEIN / CREATININE RATIO, URINE
Albumin, U: 19
Creatinine, Urine: 217

## 2023-10-26 LAB — MICROALBUMIN / CREATININE URINE RATIO: Microalb Creat Ratio: 8.7

## 2023-10-26 LAB — MICROALBUMIN, URINE: Microalb, Ur: 19

## 2023-10-26 LAB — HEMOGLOBIN A1C: Hemoglobin A1C: 7.7

## 2023-10-30 DIAGNOSIS — E782 Mixed hyperlipidemia: Secondary | ICD-10-CM | POA: Diagnosis not present

## 2023-10-30 DIAGNOSIS — E1040 Type 1 diabetes mellitus with diabetic neuropathy, unspecified: Secondary | ICD-10-CM | POA: Diagnosis not present

## 2023-10-30 DIAGNOSIS — E1059 Type 1 diabetes mellitus with other circulatory complications: Secondary | ICD-10-CM | POA: Diagnosis not present

## 2023-10-30 DIAGNOSIS — Z9641 Presence of insulin pump (external) (internal): Secondary | ICD-10-CM | POA: Diagnosis not present

## 2023-10-30 DIAGNOSIS — E10649 Type 1 diabetes mellitus with hypoglycemia without coma: Secondary | ICD-10-CM | POA: Diagnosis not present

## 2023-11-06 DIAGNOSIS — J449 Chronic obstructive pulmonary disease, unspecified: Secondary | ICD-10-CM | POA: Diagnosis not present

## 2023-11-06 DIAGNOSIS — Z87891 Personal history of nicotine dependence: Secondary | ICD-10-CM | POA: Diagnosis not present

## 2023-11-06 DIAGNOSIS — Z1331 Encounter for screening for depression: Secondary | ICD-10-CM | POA: Diagnosis not present

## 2023-11-09 ENCOUNTER — Other Ambulatory Visit: Payer: Self-pay | Admitting: Specialist

## 2023-11-09 DIAGNOSIS — Z87891 Personal history of nicotine dependence: Secondary | ICD-10-CM

## 2023-11-09 DIAGNOSIS — J449 Chronic obstructive pulmonary disease, unspecified: Secondary | ICD-10-CM

## 2023-11-19 ENCOUNTER — Other Ambulatory Visit: Payer: Self-pay | Admitting: Internal Medicine

## 2023-11-19 DIAGNOSIS — M25551 Pain in right hip: Secondary | ICD-10-CM

## 2023-11-20 DIAGNOSIS — M48062 Spinal stenosis, lumbar region with neurogenic claudication: Secondary | ICD-10-CM | POA: Diagnosis not present

## 2023-11-20 DIAGNOSIS — M5416 Radiculopathy, lumbar region: Secondary | ICD-10-CM | POA: Diagnosis not present

## 2023-11-20 NOTE — Progress Notes (Signed)
 HPI The patient is a 68 year old previously followed by Dr. Sharrie and presents today for follow-up of low back pain with radiation into the right buttock, posterior lateral thigh, posterior lateral calf, and dorsum of the foot.  He has had periodic flares of severe pain radiating into the right greater than left lower extremities.  His pain is often worsened with standing and walking.  He has some relief with lying in bed.  Medications have included tramadol  50 mg, Percocet 5/325 and 7.5/325, and Norco 7.5/325.  He developed a severe flare of pain in early May 2021.  This led to a total of 3 emergency department visits.  Prednisone  and narcotics were of minimal benefit.  An updated MRI was obtained and demonstrated minimal changes from his previous study.  He was evaluated by Dr. Bluford (neurosurgery) on 10/08/2019.  He believes the lateral recess stenosis at L4-5 contributes to his pain but his pain may be out of proportion of the stenosis there (uncontrolled blood sugars).  Consideration is being made for a right L4-5 decompression.  He was last evaluated on 09/21/2023 at which time he continued with baclofen  as needed and gabapentin  300 mg 3 times daily.  He reported physical therapy (benchmark?) with mild to moderate relief.  He had recurrent right lower extremity pain and was scheduled for right L5-S1 and right S1 transforaminal ESI (dexamethasone  8 mg).  Today he reports complete resolution of his right lower extremity pain following the ESI.  He continues to have mild to moderate bilateral lumbosacral pain that sounds most consistent with facet mediated pain.  We discussed MBB/RFA.  His symptoms are not severe enough to warrant further an intervention at this time.  He would like to be able to call for another ESI if he has recurrent lumbar radicular symptoms.   He has diabetes mellitus.  Hemoglobin A1c on 07/20/2023 was 7.5.  He quit smoking in 2023.  He was diagnosed with COPD around that  time.  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  He tries to limit medications.  He will take Celebrex  as needed, baclofen  as needed, and gabapentin  as needed.  He has been able to return back to work at [employer] (par[employer]).  He is able to take frequent breaks as needed.  Procedures: 10/24/2023: Right L5-S1 and right S1 transforaminal ESI (nearly 100% relief of his radicular pain, dexamethasone  8 mg) 07/08/2021: Right L5-S1 and right S1 transforaminal ESI (95% relief, dexamethasone  8 mg) 10/09/2019: Right L5-S1 and right S1 transforaminal ESI (90% relief, dexamethasone  8 mg) 09/26/2019: Right L5-S1 transforaminal ESI (30% relief, dexamethasone  8 mg) 02/06/2019: Bilateral L5-S1 transforaminal ESI (good relief) 07/26/2018: Right L5-S1 transforaminal ESI (good relief) 12/29/2017: Right L5-S1 transforaminal ESI (good relief)  The [place]  Narcotic Database was reviewed today.    Past Medical History:  Diagnosis Date  . ACE-inhibitor cough   . Aortic atherosclerosis ()    on chest CT 05/2019  . Cataract cortical, senile   . COPD (chronic obstructive pulmonary disease) (CMS/HHS-HCC)   . COVID-19 12/2019   received monoclonal antibody infusion  . Depression   . Diabetes mellitus type I (CMS/HHS-HCC)   . Erectile dysfunction   . Frozen shoulder    left  . GERD (gastroesophageal reflux disease)   . History of migraine headaches   . Hyperlipidemia   . Type 1 diabetes mellitus with peripheral  autonomic neuropathy     Past Surgical History:  Procedure Laterality Date  . CATARACT EXTRACTION    . cataract surgery Bilateral   . COLONOSCOPY      Social History   Socioeconomic History  . Marital status: Married  Tobacco Use  . Smoking status: Former    Current packs/day: 0.00    Average packs/day: 0.8 packs/day for 52.0 years (39.0 ttl pk-yrs)    Types: Cigarettes    Start  date: 04/10/1970    Quit date: 04/10/2022    Years since quitting: 1.6  . Smokeless tobacco: Never  . Tobacco comments:    smoked for 40 yrs  Vaping Use  . Vaping status: Never Used  Substance and Sexual Activity  . Alcohol use: Never  . Drug use: Never  . Sexual activity: Not Currently    Birth control/protection: Abstinence, None   Social Drivers of Health   Financial Resource Strain: Low Risk  (01/11/2023)   Received from [facility]   Overall Financial Resource Strain (CARDIA)   . Difficulty of Paying Living Expenses: Not hard at all  Food Insecurity: No Food Insecurity (01/11/2023)   Received from [facility]   Hunger Vital Sign   . Within the past 12 months, you worried that your food would run out before you got the money to buy more.: Never true   . Within the past 12 months, the food you bought just didn't last and you didn't have money to get more.: Never true  Transportation Needs: No Transportation Needs (01/11/2023)   Received from [facility] - Transportation   . Lack of Transportation (Medical): No   . Lack of Transportation (Non-Medical): No    Family History  Problem Relation Name Age of Onset  . Diabetes type II Mother Mom   . Diabetes type II Father Dad   . Epilepsy Father Dad   . Crohn's disease Daughter    . Epilepsy Daughter    . Asthma Daughter Cherie   . Allergies Daughter Cherie   . Other Sister         cornea tansplant bilat  . Reflux disease Brother    . Diabetes Maternal Grandmother    . Alcohol abuse Maternal Grandfather Grandpa   . Diabetes Paternal Grandmother    . No Known Problems Paternal Grandfather    . Diabetes Sister    . Brain cancer Sister    . No Known Problems Brother    . Gastric bypass surgery Son    . Anxiety Son CT   . Anxiety Son RT     Current Outpatient Medications on File Prior to Visit  Medication Sig Dispense Refill  . albuterol  90 mcg/actuation inhaler Inhale into the lungs    . aspirin  81 MG EC  tablet Take 81 mg by mouth once daily. Reported on 08/24/2015     . baclofen  (LIORESAL ) 5 mg tablet Take 5 mg by mouth 2 (two) times daily as needed    . blood-glucose sensor (DEXCOM G6 SENSOR) Devi Use 1 each every 10 (ten) days 9 each 3  . blood-glucose sensor 1 each    . buPROPion  (WELLBUTRIN  SR) 150 MG SR tablet Take 1 tablet (150 mg total) by mouth 2 (two) times daily. 60 tablet 0  . celecoxib  (CELEBREX ) 50 MG capsule Take 50 mg by mouth 2 (two) times daily as needed for Pain    . cholecalciferol , vitamin D3, (VITAMIN D3) 125 mcg (5,000 unit) tablet Take 5,000  Units by mouth once daily    . fluticasone-umeclidinium-vilanterol (TRELEGY ELLIPTA) 100-62.5-25 mcg inhaler INHALE 1 PUFF ONCE DAILY AS DIRECTED 60 each 2  . gabapentin  (NEURONTIN ) 300 MG capsule 1 po tid (Patient taking differently: Take 600 mg by mouth at bedtime) 90 capsule 11  . insulin  ASPART (NOVOLOG ) injection (concentration 100 units/mL) Inject up to 88 units daily in insulin  pump. DX 10.65 80 mL 3  . losartan  (COZAAR ) 25 MG tablet Take 1 tablet by mouth once daily    . pravastatin  (PRAVACHOL ) 40 MG tablet Take 40 mg by mouth once daily    . semaglutide (RYBELSUS) 3 mg tablet Take 1 tablet (3 mg total) by mouth once daily Do not cut, crush, or chew 30 tablet 0  . VITAMIN B COMPLEX ORAL Take 1,000 mcg by mouth once daily    . zolpidem  (AMBIEN ) 10 mg tablet Take 10 mg by mouth nightly as needed for Sleep.    . albuterol  (PROVENTIL ) 2.5 mg /3 mL (0.083 %) nebulizer solution Take 3 mLs (2.5 mg total) by nebulization every 6 (six) hours as needed for Wheezing 75 mL 0   No current facility-administered medications on file prior to visit.    Allergies as of 11/20/2023 - Reviewed 11/20/2023  Allergen Reaction Noted  . Clavulanic acid Nausea And Vomiting 10/26/2015  . Lisinopril Other (See Comments) and Cough 11/15/2016    ROS More than 10 system, review of system form was given to the patient to fill out and has been signed by  Dr. Avanell and scanned into the patient's chart.   Vital signs Vitals:   11/20/23 0858  BP: 117/64  Pulse: 70  Temp: 36 C (96.8 F)  TempSrc: Oral  Weight: 87.5 kg (192 lb 14.4 oz)  Height: 170.2 cm (5' 7.01)  PainSc: 1   PainLoc: Back    Exam Lumbosacral Exam (performed 11/04/2019) On inspection no scar is noted.  On palpation he has minimal tenderness of the lumbosacral paraspinal musculature.  Lumbar extension with rotation produces mild lumbosacral pain.  Lower Extremity Exam He has 5/5 strength of bilateral dorsiflexors, knee extensors, and hip flexors.  On sensation testing he has impaired sensation in the dorsum of the right foot.  Otherwise sensation light touch is intact throughout bilateral lower extremities.  He has an absent right patellar and Achilles reflex.  He has a 1+ left patellar and 1+ left Achilles reflex.  He does not have Babinski sign wrinkle clonus.  Straight leg raises negative bilaterally.  Range of motion of the hip joints does not produce pain.   Radiographic Data MRI of the lumbar spine [facility] dated 10/01/2019 both images and report were reviewed today.  At L5-S1 there is mild disc desiccation and mild disc bulging.    This is more eccentric to the right with a tiny paracentral protrusion.  As before the disc contacts the descending right S1 nerve root.  There is no significant central or foraminal stenosis.  At L4-5 there is broad-based disc bulging with a superimposed small right foraminal/paracentral protrusion.  This appears to encroach upon the descending right L5 nerve root.  There is moderate right foraminal stenosis.  There is no left foraminal stenosis or central stenosis.  At L3-4 there is mild disc bulging without significant central or foraminal stenosis.  The remaining levels are unremarkable.  Radiology report indicates that these findings are not significantly changed since his MRI in 12/21/2017.   Impression 1.  Acute on chronic low back  pain with radiation into the right buttock, posterior lateral thigh, posterior lateral calf, and into the dorsum of the foot.  MRI from [facility] dated 10/01/2019 demonstrates at L5-S1, there is mild disc desiccation and mild disc bulging.    This is more eccentric to the right with a tiny paracentral protrusion.  As before the disc contacts the descending right S1 nerve root.  There is no significant central or foraminal stenosis.  At L4-5 there is broad-based disc bulging with a superimposed small right foraminal/paracentral protrusion.  This appears to encroach upon the descending right L5 nerve root.  There is moderate right foraminal stenosis.  There is no left foraminal stenosis or central stenosis.  At L3-4 there is mild disc bulging without significant central or foraminal stenosis.  The remaining levels are unremarkable.  Radiology report indicates that these findings are not significantly changed since his MRI in 12/21/2017.  2.  Diabetes mellitus, type I, poorly controlled.  Hemoglobin A1c on 07/16/2019 was 10.3.  3.  COPD, dyslipidemia, migraine headaches.   Plan 1.  NSAIDs will be avoided. 2.  Continue baclofen  as needed as previously prescribed. 3.  Continue gabapentin  300 mg 3 times daily.  He has held this recently and does not need a refill at this time. 4.  Continue Biofreeze. 5.  He will follow-up with me as needed.  If he has recurrent right lower extremity ocular pain he can call we will schedule him for a right L5-S1 and right S1 transforaminal ESI (after 01/24/2024).  He has done well with Norco 7.5/325 for severe flares of pain if needed.    Paul CHARLES CHASNIS, DO  This note was generated in part with voice recognition software and I apologize for any typographical errors that were not detected and corrected.  PCP: Dr. Leita Adie

## 2023-11-21 NOTE — Telephone Encounter (Signed)
 Requested Prescriptions  Pending Prescriptions Disp Refills   celecoxib  (CELEBREX ) 50 MG capsule [Pharmacy Med Name: Celecoxib  50 MG Oral Capsule] 60 capsule 0    Sig: TAKE 1 CAPSULE BY MOUTH TWICE DAILY AS NEEDED FOR PAIN     Analgesics:  COX2 Inhibitors Failed - 11/21/2023  2:43 PM      Failed - Manual Review: Labs are only required if the patient has taken medication for more than 8 weeks.      Passed - HGB in normal range and within 360 days    Hemoglobin  Date Value Ref Range Status  06/02/2023 14.5 13.0 - 17.7 g/dL Final         Passed - Cr in normal range and within 360 days    Creatinine, Ser  Date Value Ref Range Status  06/02/2023 1.05 0.76 - 1.27 mg/dL Final   Creatinine, Urine  Date Value Ref Range Status  07/20/2023 177  Final         Passed - HCT in normal range and within 360 days    Hematocrit  Date Value Ref Range Status  06/02/2023 44.5 37.5 - 51.0 % Final         Passed - AST in normal range and within 360 days    AST  Date Value Ref Range Status  06/02/2023 19 0 - 40 IU/L Final         Passed - ALT in normal range and within 360 days    ALT  Date Value Ref Range Status  06/02/2023 19 0 - 44 IU/L Final         Passed - eGFR is 30 or above and within 360 days    GFR calc Af Amer  Date Value Ref Range Status  09/29/2019 >60 >60 mL/min Final   GFR, Estimated  Date Value Ref Range Status  05/03/2022 >60 >60 mL/min Final    Comment:    (NOTE) Calculated using the CKD-EPI Creatinine Equation (2021)    eGFR  Date Value Ref Range Status  06/02/2023 77 >59 mL/min/1.73 Final         Passed - Patient is not pregnant      Passed - Valid encounter within last 12 months    Recent Outpatient Visits           2 months ago Abscess of parasternal chest wall   [facility] at [facility], Selinda PARAS, MD   3 months ago Greater trochanteric pain syndrome of right lower extremity   [facility] Primary Care &  Sports Medicine at [facility], Selinda PARAS, MD

## 2023-11-24 ENCOUNTER — Ambulatory Visit
Admission: RE | Admit: 2023-11-24 | Discharge: 2023-11-24 | Disposition: A | Discharge status: Home / Self Care | Source: Ambulatory Visit | Attending: Specialist | Admitting: Specialist

## 2023-11-24 DIAGNOSIS — Z87891 Personal history of nicotine dependence: Secondary | ICD-10-CM | POA: Insufficient documentation

## 2023-11-24 DIAGNOSIS — J449 Chronic obstructive pulmonary disease, unspecified: Secondary | ICD-10-CM | POA: Insufficient documentation

## 2023-12-08 ENCOUNTER — Ambulatory Visit (INDEPENDENT_AMBULATORY_CARE_PROVIDER_SITE_OTHER): Admitting: Internal Medicine

## 2023-12-08 ENCOUNTER — Encounter: Payer: Self-pay | Admitting: Internal Medicine

## 2023-12-08 VITALS — BP 122/72 | HR 81 | Ht 67.0 in | Wt 191.0 lb

## 2023-12-08 DIAGNOSIS — F324 Major depressive disorder, single episode, in partial remission: Secondary | ICD-10-CM | POA: Diagnosis not present

## 2023-12-08 DIAGNOSIS — Z794 Long term (current) use of insulin: Secondary | ICD-10-CM

## 2023-12-08 DIAGNOSIS — E1040 Type 1 diabetes mellitus with diabetic neuropathy, unspecified: Secondary | ICD-10-CM

## 2023-12-08 DIAGNOSIS — G4700 Insomnia, unspecified: Secondary | ICD-10-CM | POA: Diagnosis not present

## 2023-12-08 DIAGNOSIS — I10 Essential (primary) hypertension: Secondary | ICD-10-CM | POA: Diagnosis not present

## 2023-12-08 MED ORDER — BUPROPION HCL ER (SR) 150 MG PO TB12: 150.0000 mg | ORAL_TABLET | Freq: Two times a day (BID) | ORAL | 1 refills | Status: DC

## 2023-12-08 NOTE — Progress Notes (Signed)
 Date:  12/08/2023   Name:  Paul Stokes   DOB:  1955/09/09   MRN:  [id number]   Chief Complaint: Hypertension and Depression  Hypertension This is a chronic problem. The problem is controlled. Pertinent negatives include no headaches or shortness of breath. Past treatments include angiotensin blockers. The current treatment provides significant improvement.  Depression        This is a chronic problem.  The problem has been gradually worsening since onset.  Associated symptoms include no fatigue, no headaches and no suicidal ideas.   Review of Systems  Constitutional:  Negative for chills, fatigue and fever.  Respiratory:  Negative for chest tightness and shortness of breath.   Gastrointestinal:  Negative for abdominal pain, constipation and diarrhea.  Neurological:  Negative for dizziness, light-headedness and headaches.  Psychiatric/Behavioral:  Positive for depression, dysphoric mood and sleep disturbance. Negative for suicidal ideas. The patient is not nervous/anxious.      Lab Results  Component Value Date   NA 140 06/02/2023   K 5.2 06/02/2023   CO2 23 06/02/2023   GLUCOSE 127 (H) 06/02/2023   BUN 23 06/02/2023   CREATININE 1.05 06/02/2023   CALCIUM 10.2 06/02/2023   EGFR 77 06/02/2023   GFRNONAA >60 05/03/2022   Lab Results  Component Value Date   CHOL 131 06/02/2023   HDL 45 06/02/2023   LDLCALC 69 06/02/2023   TRIG 91 06/02/2023   CHOLHDL 2.9 06/02/2023   Lab Results  Component Value Date   TSH 2.69 01/17/2023   Lab Results  Component Value Date   HGBA1C 7.7 10/26/2023   Lab Results  Component Value Date   WBC 8.3 06/02/2023   HGB 14.5 06/02/2023   HCT 44.5 06/02/2023   MCV 88 06/02/2023   PLT 389 06/02/2023   Lab Results  Component Value Date   ALT 19 06/02/2023   AST 19 06/02/2023   ALKPHOS 114 06/02/2023   BILITOT 0.5 06/02/2023   No results found for: MARIEN BOLLS, VD25OH   Patient Active Problem List   Diagnosis Date  Noted   Abscess of parasternal chest wall 09/14/2023   Greater trochanteric pain syndrome of right lower extremity 06/06/2023   Essential hypertension 05/03/2022   Major depression single episode, in partial remission (HCC) 05/03/2022   Chronic obstructive pulmonary disease (HCC) 05/02/2022   Benign essential tremor 07/26/2021   History of gross hematuria 01/05/2021   History of colonic polyps    Aortic atherosclerosis (HCC) 08/27/2019   BPH with obstruction/lower urinary tract symptoms 06/15/2015   Hyperlipidemia due to type 1 diabetes mellitus (HCC) 06/15/2015   Insomnia, persistent 03/06/2015   Compulsive tobacco user syndrome 03/06/2015   Type 1 diabetes mellitus with sensory neuropathy (HCC) 03/06/2015   Presence of insulin  pump 06/08/2014    Allergies  Allergen Reactions   Lisinopril Cough    Past Surgical History:  Procedure Laterality Date   CATARACT EXTRACTION, BILATERAL     COLONOSCOPY  08/14/2013   7 tubular adenomas   COLONOSCOPY WITH PROPOFOL  N/A 09/10/2019   Procedure: COLONOSCOPY WITH PROPOFOL ;  Surgeon: Jinny Carmine, MD;  Location: [facility] ENDOSCOPY;  Service: Endoscopy;  Laterality: N/A;   COLONOSCOPY WITH PROPOFOL  N/A 09/11/2019   Procedure: COLONOSCOPY WITH PROPOFOL ;  Surgeon: Therisa Bi, MD;  Location: [facility] ENDOSCOPY;  Service: Gastroenterology;  Laterality: N/A;   HOLEP-LASER ENUCLEATION OF THE PROSTATE WITH MORCELLATION N/A 04/02/2021   Procedure: HOLEP-LASER ENUCLEATION OF THE PROSTATE WITH MORCELLATION;  Surgeon: Francisca Redell BROCKS, MD;  Location:  [facility] ORS;  Service: Urology;  Laterality: N/A;    Social History   Tobacco Use   Smoking status: Former    Current packs/day: 0.00    Average packs/day: 0.3 packs/day for 53.9 years (13.5 ttl pk-yrs)    Types: Cigarettes    Start date: 05/12/1968    Quit date: 04/13/2022    Years since quitting: 1.6    Passive exposure: Past   Smokeless tobacco: Never  Vaping Use   Vaping status: Never Used  Substance  Use Topics   Alcohol use: No    Alcohol/week: 0.0 standard drinks of alcohol   Drug use: No     Medication list has been reviewed and updated.  Current Meds  Medication Sig   albuterol  (PROVENTIL ) (2.5 MG/3ML) 0.083% nebulizer solution TAKE 3 MLS BY NEBULIZATION EVERY 4 HOURSAS NEEDED FOR WHEEZING OR SHORTNESS OF BREATH   albuterol  (VENTOLIN  HFA) 108 (90 Base) MCG/ACT inhaler Inhale 2 puffs into the lungs every 4 (four) hours as needed for wheezing or shortness of breath.   aspirin  EC 81 MG tablet Take 81 mg by mouth daily. Swallow whole.   Baclofen  5 MG TABS TAKE 1 TABLET BY MOUTH AT BEDTIME AS NEEDED FOR  MUSCLE  PAIN   BAYER CONTOUR NEXT TEST test strip    celecoxib  (CELEBREX ) 50 MG capsule TAKE 1 CAPSULE BY MOUTH TWICE DAILY AS NEEDED FOR PAIN   Continuous Blood Gluc Sensor (DEXCOM G6 SENSOR) MISC by Does not apply route.   Cyanocobalamin  5000 MCG/ML LIQD Place under the tongue daily. Liquid   Fluticasone-Umeclidin-Vilant (TRELEGY ELLIPTA) 200-62.5-25 MCG/ACT AEPB Inhale into the lungs.   gabapentin  (NEURONTIN ) 300 MG capsule TAKE 2 CAPULES BY MOUTH THREE TIMES DAILY   HUMALOG 100 UNIT/ML injection Inject 80 Units into the skin See admin instructions. 80 units per day through insulin  pump   losartan  (COZAAR ) 25 MG tablet TAKE ONE TABLET (25 MG) BY MOUTH EVERY DAY   pravastatin  (PRAVACHOL ) 40 MG tablet Take 1 tablet (40 mg total) by mouth daily.   Semaglutide (RYBELSUS) 7 MG TABS Take 1 tablet by mouth daily at 6 (six) AM.   Spacer/Aero-Holding Chambers (AEROCHAMBER MV) inhaler Use as instructed   VITAMIN D  PO Take 5,000 Units by mouth daily.   zolpidem  (AMBIEN ) 10 MG tablet Take 1 tablet (10 mg total) by mouth at bedtime.       12/08/2023    1:14 PM 09/14/2023   11:12 AM 06/02/2023    8:07 AM 05/29/2023   10:11 AM  GAD 7 : Generalized Anxiety Score  Nervous, Anxious, on Edge 2 0 0 0  Control/stop worrying 0 0 0 0  Worry too much - different things 0 0 0 0  Trouble relaxing 2 0  1 1  Restless 2 1 2  0  Easily annoyed or irritable 3 1 1 2   Afraid - awful might happen 0 0 0 0  Total GAD 7 Score 9 2 4 3   Anxiety Difficulty Somewhat difficult Not difficult at all Not difficult at all Not difficult at all       12/08/2023    1:14 PM 09/14/2023   11:11 AM 06/02/2023    8:06 AM  Depression screen PHQ 2/9  Decreased Interest 2 1 0  Down, Depressed, Hopeless 1 0 0  PHQ - 2 Score 3 1 0  Altered sleeping 2 1 2   Tired, decreased energy 1 1 1   Change in appetite 1 0 0  Feeling bad or failure about yourself  0 0 0  Trouble concentrating 1 0 0  Moving slowly or fidgety/restless 1 1 0  Suicidal thoughts 0 0 2  PHQ-9 Score 9 4 5   Difficult doing work/chores Not difficult at all Not difficult at all Not difficult at all    BP Readings from Last 3 Encounters:  12/08/23 122/72  09/14/23 122/70  08/04/23 122/74    Physical Exam Vitals and nursing note reviewed.  Constitutional:      General: He is not in acute distress.    Appearance: He is well-developed.  HENT:     Head: Normocephalic and atraumatic.  Cardiovascular:     Rate and Rhythm: Normal rate and regular rhythm.  Pulmonary:     Effort: Pulmonary effort is normal. No respiratory distress.     Breath sounds: No wheezing or rhonchi.  Musculoskeletal:     Cervical back: Normal range of motion.     Right lower leg: No edema.     Left lower leg: No edema.  Skin:    General: Skin is warm and dry.     Findings: No rash.  Neurological:     Mental Status: He is alert and oriented to person, place, and time.  Psychiatric:        Mood and Affect: Mood normal.        Behavior: Behavior normal.     Wt Readings from Last 3 Encounters:  12/08/23 191 lb (86.6 kg)  09/14/23 191 lb (86.6 kg)  08/04/23 189 lb (85.7 kg)    BP 122/72   Pulse 81   Ht 5' 7 (1.702 m)   Wt 191 lb (86.6 kg)   SpO2 96%   BMI 29.91 kg/m   Assessment and Plan:  Problem List Items Addressed This Visit       Unprioritized    Essential hypertension - Primary (Chronic)   Blood pressure is well controlled on Losartan . No medication side effects noted. Plan to continue current medications.       Insomnia, persistent   He takes Ambien  nightly - now paying out of pocket $17/mo He has tried many other agents with no benefit.  Insurance will cover Doxepin.      Major depression single episode, in partial remission (HCC) (Chronic)   Depression symptoms have been worsening again since stopping Bupropion . Will resume bupropion  150 mg in the AM for one week then increase to twice a day. Follow up if no improvement.      Relevant Medications   buPROPion  (WELLBUTRIN  SR) 150 MG 12 hr tablet   Type 1 diabetes mellitus with sensory neuropathy (HCC) (Chronic)   Followed by endo On Insulin  pump and Rybelsus recently added with good response and no side effects He is overdue for eye exam      Relevant Medications   Semaglutide (RYBELSUS) 7 MG TABS   Other Visit Diagnoses       Long-term insulin  use (HCC)           Return in about 6 months (around 06/09/2024) for CPX new PCP.    Leita HILARIO Adie, MD [facility] [place]

## 2023-12-08 NOTE — Assessment & Plan Note (Addendum)
 Depression symptoms have been worsening again since stopping Bupropion . Will resume bupropion  150 mg in the AM for one week then increase to twice a day. Follow up if no improvement.

## 2023-12-08 NOTE — Assessment & Plan Note (Signed)
 Blood pressure is well controlled on Losartan . No medication side effects noted. Plan to continue current medications.

## 2023-12-08 NOTE — Assessment & Plan Note (Signed)
 He takes Ambien  nightly - now paying out of pocket $17/mo He has tried many other agents with no benefit.  Insurance will cover Doxepin.

## 2023-12-08 NOTE — Assessment & Plan Note (Addendum)
 Followed by endo On Insulin  pump and Rybelsus recently added with good response and no side effects He is overdue for eye exam

## 2023-12-13 DIAGNOSIS — E1065 Type 1 diabetes mellitus with hyperglycemia: Secondary | ICD-10-CM | POA: Diagnosis not present

## 2023-12-17 ENCOUNTER — Other Ambulatory Visit: Payer: Self-pay | Admitting: Internal Medicine

## 2023-12-17 DIAGNOSIS — M25551 Pain in right hip: Secondary | ICD-10-CM

## 2023-12-19 NOTE — Telephone Encounter (Signed)
 Requested Prescriptions  Pending Prescriptions Disp Refills   celecoxib  (CELEBREX ) 50 MG capsule [Pharmacy Med Name: Celecoxib  50 MG Oral Capsule] 60 capsule 2    Sig: TAKE 1 CAPSULE BY MOUTH TWICE DAILY AS NEEDED FOR PAIN     Analgesics:  COX2 Inhibitors Failed - 12/19/2023  8:48 AM      Failed - Manual Review: Labs are only required if the patient has taken medication for more than 8 weeks.      Passed - HGB in normal range and within 360 days    Hemoglobin  Date Value Ref Range Status  06/02/2023 14.5 13.0 - 17.7 g/dL Final         Passed - Cr in normal range and within 360 days    Creatinine, Ser  Date Value Ref Range Status  06/02/2023 1.05 0.76 - 1.27 mg/dL Final   Creatinine, Urine  Date Value Ref Range Status  10/26/2023 217  Final         Passed - HCT in normal range and within 360 days    Hematocrit  Date Value Ref Range Status  06/02/2023 44.5 37.5 - 51.0 % Final         Passed - AST in normal range and within 360 days    AST  Date Value Ref Range Status  06/02/2023 19 0 - 40 IU/L Final         Passed - ALT in normal range and within 360 days    ALT  Date Value Ref Range Status  06/02/2023 19 0 - 44 IU/L Final         Passed - eGFR is 30 or above and within 360 days    GFR calc Af Amer  Date Value Ref Range Status  09/29/2019 >60 >60 mL/min Final   GFR, Estimated  Date Value Ref Range Status  05/03/2022 >60 >60 mL/min Final    Comment:    (NOTE) Calculated using the CKD-EPI Creatinine Equation (2021)    eGFR  Date Value Ref Range Status  06/02/2023 77 >59 mL/min/1.73 Final         Passed - Patient is not pregnant      Passed - Valid encounter within last 12 months    Recent Outpatient Visits           1 week ago Essential hypertension   [place] [facility] at [facility] Leita DEL, MD   3 months ago Abscess of parasternal chest wall   [facility] at [facility], Selinda PARAS, MD   4 months ago Greater trochanteric pain syndrome of right lower extremity   [facility] at [facility], Selinda PARAS, MD

## 2023-12-23 ENCOUNTER — Other Ambulatory Visit: Payer: Self-pay | Admitting: Internal Medicine

## 2023-12-23 DIAGNOSIS — M25551 Pain in right hip: Secondary | ICD-10-CM

## 2023-12-26 NOTE — Telephone Encounter (Signed)
 Requested medication (s) are due for refill today: na  Requested medication (s) are on the active medication list: yes   Last refill:  09/27/23 #90 0 refills  Future visit scheduled: yes 06/14/24  Notes to clinic:  no refills remain. Do you want to refill Rx?     Requested Prescriptions  Pending Prescriptions Disp Refills   Baclofen  5 MG TABS [Pharmacy Med Name: Baclofen  5 MG Oral Tablet] 90 tablet 0    Sig: TAKE 1 TABLET BY MOUTH AT BEDTIME AS NEEDED FOR  MUSCLE  PAIN     Analgesics:  Muscle Relaxants - baclofen  Failed - 12/26/2023  3:48 PM      Failed - Cr in normal range and within 180 days    Creatinine, Ser  Date Value Ref Range Status  06/02/2023 1.05 0.76 - 1.27 mg/dL Final   Creatinine, Urine  Date Value Ref Range Status  10/26/2023 217  Final         Failed - eGFR is 30 or above and within 180 days    GFR calc Af Amer  Date Value Ref Range Status  09/29/2019 >60 >60 mL/min Final   GFR, Estimated  Date Value Ref Range Status  05/03/2022 >60 >60 mL/min Final    Comment:    (NOTE) Calculated using the CKD-EPI Creatinine Equation (2021)    eGFR  Date Value Ref Range Status  06/02/2023 77 >59 mL/min/1.73 Final         Passed - Valid encounter within last 6 months    Recent Outpatient Visits           2 weeks ago Essential hypertension   [place] [facility] at [facility], Leita DEL, MD   3 months ago Abscess of parasternal chest wall   [facility] at [facility], Selinda PARAS, MD   4 months ago Greater trochanteric pain syndrome of right lower extremity   [facility] at [facility], Selinda PARAS, MD

## 2024-01-11 ENCOUNTER — Other Ambulatory Visit: Payer: Self-pay | Admitting: Internal Medicine

## 2024-01-11 DIAGNOSIS — R809 Proteinuria, unspecified: Secondary | ICD-10-CM

## 2024-01-12 NOTE — Telephone Encounter (Signed)
 Requested Prescriptions  Pending Prescriptions Disp Refills   losartan  (COZAAR ) 25 MG tablet [Pharmacy Med Name: Losartan  Potassium 25 MG Oral Tablet] 90 tablet 0    Sig: Take 1 tablet by mouth once daily     Cardiovascular:  Angiotensin Receptor Blockers Failed - 01/12/2024  2:31 PM      Failed - Cr in normal range and within 180 days    Creatinine, Ser  Date Value Ref Range Status  06/02/2023 1.05 0.76 - 1.27 mg/dL Final   Creatinine, Urine  Date Value Ref Range Status  10/26/2023 217  Final         Failed - K in normal range and within 180 days    Potassium  Date Value Ref Range Status  06/02/2023 5.2 3.5 - 5.2 mmol/L Final         Passed - Patient is not pregnant      Passed - Last BP in normal range    BP Readings from Last 1 Encounters:  12/08/23 122/72         Passed - Valid encounter within last 6 months    Recent Outpatient Visits           1 month ago Essential hypertension   [place] [facility] at [facility], Leita DEL, MD   4 months ago Abscess of parasternal chest wall   [facility] at [facility], Selinda PARAS, MD   5 months ago Greater trochanteric pain syndrome of right lower extremity   [facility] at [facility], Selinda PARAS, MD

## 2024-01-17 ENCOUNTER — Ambulatory Visit: Payer: Medicare Other | Admitting: Emergency Medicine

## 2024-01-17 VITALS — Ht 67.0 in | Wt 180.0 lb

## 2024-01-17 DIAGNOSIS — Z0000 Encounter for general adult medical examination without abnormal findings: Secondary | ICD-10-CM | POA: Diagnosis not present

## 2024-01-17 NOTE — Progress Notes (Signed)
 Subjective:   Paul Stokes is a 68 y.o. who presents for a Medicare Wellness preventive visit.  As a reminder, Annual Wellness Visits don't include a physical exam, and some assessments may be limited, especially if this visit is performed virtually. We may recommend an in-person follow-up visit with your provider if needed.  Visit Complete: Virtual I connected with  Paul Stokes on 01/17/24 by a audio enabled telemedicine application and verified that I am speaking with the correct person using two identifiers.  Patient Location: Home  Provider Location: Home Office  I discussed the limitations of evaluation and management by telemedicine. The patient expressed understanding and agreed to proceed.  Vital Signs: Because this visit was a virtual/telehealth visit, some criteria may be missing or patient reported. Any vitals not documented were not able to be obtained and vitals that have been documented are patient reported.  VideoDeclined- This patient declined Librarian, academic. Therefore the visit was completed with audio only.  Persons Participating in Visit: Patient.  AWV Questionnaire: No: Patient Medicare AWV questionnaire was not completed prior to this visit.  Cardiac Risk Factors include: advanced age (>76men, >52 women);male gender;dyslipidemia;hypertension;diabetes mellitus     Objective:    Today's Vitals   01/17/24 1312  Weight: 180 lb (81.6 kg)  Height: 5' 7 (1.702 m)   Body mass index is 28.19 kg/m.     01/17/2024    1:29 PM 07/27/2023    4:05 PM 01/11/2023    1:45 PM 05/02/2022    5:20 PM 04/02/2021    7:17 AM 03/30/2021    2:14 PM 12/30/2020    8:37 AM  Advanced Directives  Does Patient Have a Medical Advance Directive? No No No No No No No  Would patient like information on creating a medical advance directive? Yes (MAU/Ambulatory/Procedural Areas - Information given)  No - Patient declined No - Patient declined No - Patient  declined No - Patient declined Yes (MAU/Ambulatory/Procedural Areas - Information given)    Current Medications (verified) Outpatient Encounter Medications as of 01/17/2024  Medication Sig   albuterol  (PROVENTIL ) (2.5 MG/3ML) 0.083% nebulizer solution TAKE 3 MLS BY NEBULIZATION EVERY 4 HOURSAS NEEDED FOR WHEEZING OR SHORTNESS OF BREATH   albuterol  (VENTOLIN  HFA) 108 (90 Base) MCG/ACT inhaler Inhale 2 puffs into the lungs every 4 (four) hours as needed for wheezing or shortness of breath.   aspirin  EC 81 MG tablet Take 81 mg by mouth daily. Swallow whole.   Baclofen  5 MG TABS TAKE 1 TABLET BY MOUTH AT BEDTIME AS NEEDED FOR  MUSCLE  PAIN   buPROPion  (WELLBUTRIN  SR) 150 MG 12 hr tablet Take 1 tablet (150 mg total) by mouth 2 (two) times daily.   celecoxib  (CELEBREX ) 50 MG capsule TAKE 1 CAPSULE BY MOUTH TWICE DAILY AS NEEDED FOR PAIN   Continuous Blood Gluc Sensor (DEXCOM G6 SENSOR) MISC by Does not apply route.   Fluticasone-Umeclidin-Vilant (TRELEGY ELLIPTA) 200-62.5-25 MCG/ACT AEPB Inhale into the lungs.   gabapentin  (NEURONTIN ) 300 MG capsule TAKE 2 CAPULES BY MOUTH THREE TIMES DAILY (Patient taking differently: Takes 2 tablets qhs)   HUMALOG 100 UNIT/ML injection Inject 80 Units into the skin See admin instructions. 80 units per day through insulin  pump   losartan  (COZAAR ) 25 MG tablet Take 1 tablet by mouth once daily   pravastatin  (PRAVACHOL ) 40 MG tablet Take 1 tablet (40 mg total) by mouth daily.   Semaglutide (RYBELSUS) 7 MG TABS Take 1 tablet by mouth daily at  6 (six) AM.   VITAMIN D  PO Take 5,000 Units by mouth daily.   zolpidem  (AMBIEN ) 10 MG tablet Take 1 tablet (10 mg total) by mouth at bedtime.   BAYER CONTOUR NEXT TEST test strip  (Patient not taking: Reported on 01/17/2024)   Cyanocobalamin  5000 MCG/ML LIQD Place under the tongue daily. Liquid (Patient not taking: Reported on 01/17/2024)   Spacer/Aero-Holding Chambers (AEROCHAMBER MV) inhaler Use as instructed (Patient not taking:  Reported on 01/17/2024)   No facility-administered encounter medications on file as of 01/17/2024.    Allergies (verified) Lisinopril   History: Past Medical History:  Diagnosis Date   Depression    Diabetes mellitus without complication (HCC)    type 1; insulin  pump   Emphysema of lung (HCC)    GERD (gastroesophageal reflux disease)    Hyperlipidemia    Prostate disease    Past Surgical History:  Procedure Laterality Date   CATARACT EXTRACTION, BILATERAL     COLONOSCOPY  08/14/2013   7 tubular adenomas   COLONOSCOPY WITH PROPOFOL  N/A 09/10/2019   Procedure: COLONOSCOPY WITH PROPOFOL ;  Surgeon: Jinny Carmine, MD;  Location: [facility] ENDOSCOPY;  Service: Endoscopy;  Laterality: N/A;   COLONOSCOPY WITH PROPOFOL  N/A 09/11/2019   Procedure: COLONOSCOPY WITH PROPOFOL ;  Surgeon: Therisa Bi, MD;  Location: [facility] ENDOSCOPY;  Service: Gastroenterology;  Laterality: N/A;   HOLEP-LASER ENUCLEATION OF THE PROSTATE WITH MORCELLATION N/A 04/02/2021   Procedure: HOLEP-LASER ENUCLEATION OF THE PROSTATE WITH MORCELLATION;  Surgeon: Francisca Redell BROCKS, MD;  Location: [facility] ORS;  Service: Urology;  Laterality: N/A;   Family History  Problem Relation Age of Onset   Diabetes Mother    Diabetes Father    Epilepsy Father    Social History   Socioeconomic History   Marital status: Married    Spouse name: Lola   Number of children: 5   Years of education: Not on file   Highest education level: 10th grade  Occupational History   Occupation: part-time  Tobacco Use   Smoking status: Former    Current packs/day: 0.00    Average packs/day: 0.3 packs/day for 53.9 years (13.5 ttl pk-yrs)    Types: Cigarettes    Start date: 05/12/1968    Quit date: 04/13/2022    Years since quitting: 1.7    Passive exposure: Past   Smokeless tobacco: Never  Vaping Use   Vaping status: Never Used  Substance and Sexual Activity   Alcohol use: No    Alcohol/week: 0.0 standard drinks of alcohol   Drug use: No   Sexual  activity: Not Currently  Other Topics Concern   Not on file  Social History Narrative   Not on file   Social Drivers of Health   Financial Resource Strain: Low Risk  (01/17/2024)   Overall Financial Resource Strain (CARDIA)    Difficulty of Paying Living Expenses: Not hard at all  Food Insecurity: No Food Insecurity (01/17/2024)   Hunger Vital Sign    Worried About Running Out of Food in the Last Year: Never true    Ran Out of Food in the Last Year: Never true  Transportation Needs: No Transportation Needs (01/17/2024)   PRAPARE - Administrator, Civil Service (Medical): No    Lack of Transportation (Non-Medical): No  Physical Activity: Inactive (01/17/2024)   Exercise Vital Sign    Days of Exercise per Week: 0 days    Minutes of Exercise per Session: 0 min  Stress: No Stress Concern Present (01/17/2024)  [employer] of Occupational Health - Occupational Stress Questionnaire    Feeling of Stress: Not at all  Social Connections: Moderately Isolated (01/17/2024)   Social Connection and Isolation Panel    Frequency of Communication with Friends and Family: More than three times a week    Frequency of Social Gatherings with Friends and Family: More than three times a week    Attends Religious Services: Never    Database administrator or Organizations: No    Attends Engineer, structural: Never    Marital Status: Married    Tobacco Counseling Counseling given: Not Answered    Clinical Intake:  Pre-visit preparation completed: Yes  Pain : No/denies pain     BMI - recorded: 25.19 Nutritional Status: BMI 25 -29 Overweight Nutritional Risks: None Diabetes: Yes CBG done?: No (FBS 98 per patient) Did pt. bring in CBG monitor from home?: No  Lab Results  Component Value Date   HGBA1C 7.7 10/26/2023   HGBA1C 7.5 07/20/2023   HGBA1C 8.0 (H) 06/02/2023     How often do you need to have someone help you when you read instructions, pamphlets, or other  written materials from your doctor or pharmacy?: 1 - Never  Interpreter Needed?: No  Information entered by :: Vina Ned, CMA   Activities of Daily Living     01/17/2024    1:17 PM  In your present state of health, do you have any difficulty performing the following activities:  Hearing? 0  Vision? 0  Difficulty concentrating or making decisions? 0  Walking or climbing stairs? 1  Comment climbing stairs due to weak legs and COPD  Dressing or bathing? 0  Doing errands, shopping? 0  Preparing Food and eating ? N  Using the Toilet? N  In the past six months, have you accidently leaked urine? N  Do you have problems with loss of bowel control? N  Managing your Medications? N  Managing your Finances? Y  Comment wife Neurosurgeon or managing your Housekeeping? N    Patient Care Team: Justus Leita DEL, MD as PCP - General (Internal Medicine) Damian Therisa HERO, MD as Physician Assistant (Endocrinology) Francisca Redell BROCKS, MD as Consulting Physician (Urology) Lucio Franky PARAS, OD (Optometry) Laurice Francis NOVAK, [place] (Optometry) Theotis Lavelle BRAVO, MD as Referring Physician (Pulmonary Disease) Avanell Katz, MD as Referring Physician (Physical Medicine and Rehabilitation)  I have updated your Care Teams any recent Medical Services you may have received from other providers in the past year.     Assessment:   This is a routine wellness examination for [place].  Hearing/Vision screen Hearing Screening - Comments:: Denies hearing loss  Vision Screening - Comments:: Gets DM eye exams, Dr. Francis Laurice, [place]   Goals Addressed               This Visit's Progress     Weight (lb) < 175 lb (79.4 kg) (pt-stated)   180 lb (81.6 kg)      Depression Screen     01/17/2024    1:26 PM 12/08/2023    1:14 PM 09/14/2023   11:11 AM 06/02/2023    8:06 AM 05/29/2023   10:11 AM 01/11/2023    1:43 PM 11/28/2022    8:03 AM  PHQ 2/9 Scores  PHQ - 2 Score 0 3 1 0 0 0 1  PHQ-  9 Score 1 9 4 5 4  0 10    Fall Risk     01/17/2024  1:30 PM 12/08/2023    1:13 PM 09/14/2023   11:11 AM 06/02/2023    8:06 AM 05/29/2023   10:11 AM  Fall Risk   Falls in the past year? 0 0 0 0 1  Number falls in past yr: 0 0 0 0 0  Injury with Fall? 0 0 0 0 0  Risk for fall due to : No Fall Risks No Fall Risks No Fall Risks No Fall Risks History of fall(s)  Follow up Falls evaluation completed Falls evaluation completed Falls evaluation completed Falls evaluation completed Falls evaluation completed    MEDICARE RISK AT HOME:  Medicare Risk at Home Any stairs in or around the home?: Yes If so, are there any without handrails?: No Home free of loose throw rugs in walkways, pet beds, electrical cords, etc?: Yes Adequate lighting in your home to reduce risk of falls?: Yes Life alert?: No Use of a cane, walker or w/c?: No Grab bars in the bathroom?: Yes Shower chair or bench in shower?: Yes Elevated toilet seat or a handicapped toilet?: Yes  TIMED UP AND GO:  Was the test performed?  No  Cognitive Function: 6CIT completed        01/17/2024    1:31 PM 01/11/2023    1:47 PM 01/07/2022    8:42 AM 12/30/2020    8:44 AM 08/06/2018    9:01 AM  6CIT Screen  What Year? 0 points 0 points 0 points 0 points 0 points  What month? 0 points 0 points 0 points 0 points 0 points  What time? 0 points 0 points 0 points 0 points 0 points  Count back from 20 0 points 0 points 0 points 0 points 0 points  Months in reverse 0 points 0 points 0 points 0 points 0 points  Repeat phrase 0 points 2 points 0 points 0 points 0 points  Total Score 0 points 2 points 0 points 0 points 0 points    Immunizations Immunization History  Administered Date(s) Administered   Fluad Trivalent(High Dose 65+) 01/11/2023   Influenza Inj Mdck Quad Pf 03/12/2018, 02/22/2019   Influenza Split 03/05/2020   Influenza,inj,Quad PF,6+ Mos 03/12/2018   Influenza,inj,quad, With Preservative 03/16/2017   Influenza-Unspecified  03/15/2015, 03/06/2019, 03/16/2020, 05/27/2021   PFIZER Comirnaty(Gray Top)Covid-19 Tri-Sucrose Vaccine 07/30/2019, 08/20/2019   PFIZER(Purple Top)SARS-COV-2 Vaccination 07/30/2019, 08/20/2019   PNEUMOCOCCAL CONJUGATE-20 05/27/2022   Pneumococcal Polysaccharide-23 05/17/2010, 03/05/2020   Respiratory Syncytial Virus Vaccine,Recomb Aduvanted(Arexvy) 06/29/2022   Zoster, Live 05/23/2013    Screening Tests Health Maintenance  Topic Date Due   DTaP/Tdap/Td (1 - Tdap) Never done   Zoster Vaccines- Shingrix (1 of 2) 11/12/2004   OPHTHALMOLOGY EXAM  01/28/2023   INFLUENZA VACCINE  12/15/2023   COVID-19 Vaccine (5 - 2025-26 season) 01/15/2024   HEMOGLOBIN A1C  04/26/2024   Diabetic kidney evaluation - eGFR measurement  06/01/2024   FOOT EXAM  06/01/2024   Colonoscopy  09/10/2024   Diabetic kidney evaluation - Urine ACR  10/25/2024   Medicare Annual Wellness (AWV)  01/16/2025   Pneumococcal Vaccine: 50+ Years  Completed   Hepatitis C Screening  Completed   HPV VACCINES  Aged Out   Meningococcal B Vaccine  Aged Out    Health Maintenance  Health Maintenance Due  Topic Date Due   DTaP/Tdap/Td (1 - Tdap) Never done   Zoster Vaccines- Shingrix (1 of 2) 11/12/2004   OPHTHALMOLOGY EXAM  01/28/2023   INFLUENZA VACCINE  12/15/2023   COVID-19 Vaccine (5 - 2025-26 season)  01/15/2024   Health Maintenance Items Addressed: See Nurse Notes at the end of this note  Additional Screening:  Vision Screening: Recommended annual ophthalmology exams for early detection of glaucoma and other disorders of the eye. Would you like a referral to an eye doctor? No    Dental Screening: Recommended annual dental exams for proper oral hygiene  Community Resource Referral / Chronic Care Management: CRR required this visit?  No   CCM required this visit?  No   Plan:    I have personally reviewed and noted the following in the patient's chart:   Medical and social history Use of alcohol, tobacco or  illicit drugs  Current medications and supplements including opioid prescriptions. Patient is not currently taking opioid prescriptions. Functional ability and status Nutritional status Physical activity Advanced directives List of other physicians Hospitalizations, surgeries, and ER visits in previous 12 months Vitals Screenings to include cognitive, depression, and falls Referrals and appointments  In addition, I have reviewed and discussed with patient certain preventive protocols, quality metrics, and best practice recommendations. A written personalized care plan for preventive services as well as general preventive health recommendations were provided to patient.   Vina Ned, CMA   01/17/2024   After Visit Summary: (MyChart) Due to this being a telephonic visit, the after visit summary with patients personalized plan was offered to patient via MyChart   Notes:  FBS this morning 98 per patient Requested most recent DM eye exam (2024) from Dr. Franky Corporal. Patient will schedule a DM eye exam with Dr. Francis Mallick for 2025. Will get flu and covid vaccines Needs shingrix vaccines (pharmacy) Needs Tdap vaccine (pharmacy Declined DM & Nutrition education referral

## 2024-01-17 NOTE — Patient Instructions (Signed)
 [name] , Thank you for taking time out of your busy schedule to complete your Annual Wellness Visit with me. I enjoyed our conversation and look forward to speaking with you again next year. I, as well as your care team,  appreciate your ongoing commitment to your health goals. Please review the following plan we discussed and let me know if I can assist you in the future. Your Game plan/ To Do List    Referrals: None   Follow up Visits: We will see or speak with you next year for your Next Medicare AWV with our clinical staff Have you seen your provider in the last 6 months (3 months if uncontrolled diabetes)? Yes  Clinician Recommendations: Schedule a diabetic eye exam with Dr. Laurice at your convenience. Get the flu and covid vaccines at your convenience. Get the Shingrix and Tetanus vaccines at your convenience at your local pharmacy.  Aim for 30 minutes of exercise or brisk walking, 6-8 glasses of water, and 5 servings of fruits and vegetables each day.       This is a list of the screenings recommended for you:  Health Maintenance  Topic Date Due   DTaP/Tdap/Td vaccine (1 - Tdap) Never done   Zoster (Shingles) Vaccine (1 of 2) 11/12/2004   Eye exam for diabetics  01/28/2023   Flu Shot  12/15/2023   COVID-19 Vaccine (5 - 2025-26 season) 01/15/2024   Hemoglobin A1C  04/26/2024   Yearly kidney function blood test for diabetes  06/01/2024   Complete foot exam   06/01/2024   Colon Cancer Screening  09/10/2024   Yearly kidney health urinalysis for diabetes  10/25/2024   Medicare Annual Wellness Visit  01/16/2025   Pneumococcal Vaccine for age over 102  Completed   Hepatitis C Screening  Completed   HPV Vaccine  Aged Out   Meningitis B Vaccine  Aged Out    Advanced directives: (ACP Link)Information on Advanced Care Planning can be found at Education administrator Advance Health Care Directives Advance Health Care Directives. [email or web address] You may also get the forms at your  doctor's office. Advance Care Planning is important because it:  [x]  Makes sure you receive the medical care that is consistent with your values, goals, and preferences  [x]  It provides guidance to your family and loved ones and reduces their decisional burden about whether or not they are making the right decisions based on your wishes.  Follow the link provided in your after visit summary or read over the paperwork we have mailed to you to help you started getting your Advance Directives in place. If you need assistance in completing these, please reach out to us  so that we can help you!  See attachments for Preventive Care and Fall Prevention Tips.   Fall Prevention in the Home, Adult Falls can cause injuries and affect people of all ages. There are many simple things that you can do to make your home safe and to help prevent falls. If you need it, ask for help making these changes. What actions can I take to prevent falls? General information Use good lighting in all rooms. Make sure to: Replace any light bulbs that burn out. Turn on lights if it is dark and use night-lights. Keep items that you use often in easy-to-reach places. Lower the shelves around your home if needed. Move furniture so that there are clear paths around it. Do not keep throw rugs or other things on the floor that  can make you trip. If any of your floors are uneven, fix them. Add color or contrast paint or tape to clearly mark and help you see: Grab bars or handrails. First and last steps of staircases. Where the edge of each step is. If you use a ladder or stepladder: Make sure that it is fully opened. Do not climb a closed ladder. Make sure the sides of the ladder are locked in place. Have someone hold the ladder while you use it. Know where your pets are as you move through your home. What can I do in the bathroom?     Keep the floor dry. Clean up any water that is on the floor right away. Remove soap  buildup in the bathtub or shower. Buildup makes bathtubs and showers slippery. Use non-skid mats or decals on the floor of the bathtub or shower. Attach bath mats securely with double-sided, non-slip rug tape. If you need to sit down while you are in the shower, use a non-slip stool. Install grab bars by the toilet and in the bathtub and shower. Do not use towel bars as grab bars. What can I do in the bedroom? Make sure that you have a light by your bed that is easy to reach. Do not use any sheets or blankets on your bed that hang to the floor. Have a firm bench or chair with side arms that you can use for support when you get dressed. What can I do in the kitchen? Clean up any spills right away. If you need to reach something above you, use a sturdy step stool that has a grab bar. Keep electrical cables out of the way. Do not use floor polish or wax that makes floors slippery. What can I do with my stairs? Do not leave anything on the stairs. Make sure that you have a light switch at the top and the bottom of the stairs. Have them installed if you do not have them. Make sure that there are handrails on both sides of the stairs. Fix handrails that are broken or loose. Make sure that handrails are as long as the staircases. Install non-slip stair treads on all stairs in your home if they do not have carpet. Avoid having throw rugs at the top or bottom of stairs, or secure the rugs with carpet tape to prevent them from moving. Choose a carpet design that does not hide the edge of steps on the stairs. Make sure that carpet is firmly attached to the stairs. Fix any carpet that is loose or worn. What can I do on the outside of my home? Use bright outdoor lighting. Repair the edges of walkways and driveways and fix any cracks. Clear paths of anything that can make you trip, such as tools or rocks. Add color or contrast paint or tape to clearly mark and help you see high doorway thresholds. Trim  any bushes or trees on the main path into your home. Check that handrails are securely fastened and in good repair. Both sides of all steps should have handrails. Install guardrails along the edges of any raised decks or porches. Have leaves, snow, and ice cleared regularly. Use sand, salt, or ice melt on walkways during winter months if you live where there is ice and snow. In the garage, clean up any spills right away, including grease or oil spills. What other actions can I take? Review your medicines with your health care provider. Some medicines can make you confused or  feel dizzy. This can increase your chance of falling. Wear closed-toe shoes that fit well and support your feet. Wear shoes that have rubber soles and low heels. Use a cane, walker, scooter, or crutches that help you move around if needed. Talk with your provider about other ways that you can decrease your risk of falls. This may include seeing a physical therapist to learn to do exercises to improve movement and strength. Where to find more information Centers for Disease Control and Prevention, STEADI: TonerPromos.no [employer] on Aging: BaseRingTones.pl National Institute on Aging: BaseRingTones.pl Contact a health care provider if: You are afraid of falling at home. You feel weak, drowsy, or dizzy at home. You fall at home. Get help right away if you: Lose consciousness or have trouble moving after a fall. Have a fall that causes a head injury. These symptoms may be an emergency. Get help right away. Call 911. Do not wait to see if the symptoms will go away. Do not drive yourself to the hospital. This information is not intended to replace advice given to you by your health care provider. Make sure you discuss any questions you have with your health care provider. Document Revised: 01/03/2022 Document Reviewed: 01/03/2022 Elsevier Patient Education  2024 [employer].

## 2024-02-15 ENCOUNTER — Other Ambulatory Visit: Payer: Self-pay | Admitting: Internal Medicine

## 2024-02-15 DIAGNOSIS — G4700 Insomnia, unspecified: Secondary | ICD-10-CM

## 2024-02-16 ENCOUNTER — Other Ambulatory Visit: Payer: Self-pay | Admitting: Internal Medicine

## 2024-02-16 DIAGNOSIS — G4700 Insomnia, unspecified: Secondary | ICD-10-CM

## 2024-02-16 NOTE — Telephone Encounter (Signed)
 Requested medications are due for refill today.  yes  Requested medications are on the active medications list.  yes  Last refill. 08/21/2023 #30 5 rf  Future visit scheduled.   yes  Notes to clinic.  Refill not delegated    Requested Prescriptions  Pending Prescriptions Disp Refills   zolpidem  (AMBIEN ) 10 MG tablet [Pharmacy Med Name: Zolpidem  Tartrate 10 MG Oral Tablet] 30 tablet 0    Sig: TAKE 1 TABLET BY MOUTH AT BEDTIME     Not Delegated - Psychiatry:  Anxiolytics/Hypnotics Failed - 02/16/2024  2:49 PM      Failed - This refill cannot be delegated      Failed - Urine Drug Screen completed in last 360 days      Passed - Valid encounter within last 6 months    Recent Outpatient Visits           2 months ago Essential hypertension    [facility] at [facility] [place], Leita DEL, MD   5 months ago Abscess of parasternal chest wall   [facility], Selinda PARAS, MD   6 months ago Greater trochanteric pain syndrome of right lower extremity   [facility] at [facility], Selinda PARAS, MD

## 2024-02-16 NOTE — Telephone Encounter (Signed)
 Copied from CRM [id number]. Topic: Clinical - Medication Refill >> Feb 16, 2024 11:25 AM Amy B wrote: Medication:  zolpidem  (AMBIEN ) 10 MG tablet  Has the patient contacted their pharmacy? Yes (Agent: If no, request that the patient contact the pharmacy for the refill. If patient does not wish to contact the pharmacy document the reason why and proceed with request.) (Agent: If yes, when and what did the pharmacy advise?)  This is the patient's preferred pharmacy:   [facility] [address], [place] - [address] [place] [address] Phone: [phone] Fax: [phone]  Is this the correct pharmacy for this prescription? Yes If no, delete pharmacy and type the correct one.   Has the prescription been filled recently? No  Is the patient out of the medication? Yes  Has the patient been seen for an appointment in the last year OR does the patient have an upcoming appointment? Yes  Can we respond through MyChart? Yes  Agent: Please be advised that Rx refills may take up to 3 business days. We ask that you follow-up with your pharmacy.

## 2024-02-16 NOTE — Telephone Encounter (Signed)
 Please review.  KP

## 2024-02-19 NOTE — Telephone Encounter (Signed)
 Requested medication (s) are due for refill today: no  Requested medication (s) are on the active medication list: yes  Last refill:  02/16/24 #30 4 RF  Future visit scheduled: yes  Notes to clinic:  med not delegated to NT to RF   Requested Prescriptions  Pending Prescriptions Disp Refills   zolpidem  (AMBIEN ) 10 MG tablet 30 tablet 5    Sig: Take 1 tablet (10 mg total) by mouth at bedtime.     Not Delegated - Psychiatry:  Anxiolytics/Hypnotics Failed - 02/19/2024  7:58 AM      Failed - This refill cannot be delegated      Failed - Urine Drug Screen completed in last 360 days      Passed - Valid encounter within last 6 months    Recent Outpatient Visits           2 months ago Essential hypertension   [place] [facility] at [facility], Leita DEL, MD   5 months ago Abscess of parasternal chest wall   [facility] at [facility], Selinda PARAS, MD   6 months ago Greater trochanteric pain syndrome of right lower extremity   [facility] at [facility], Selinda PARAS, MD

## 2024-02-19 NOTE — Telephone Encounter (Signed)
 Requested medication (s) are due for refill today: no  Requested medication (s) are on the active medication list: yes  Last refill:  02/16/24 #30 4 RF  Future visit scheduled: yes  Notes to clinic:  med not delegated to NT to RF   Requested Prescriptions  Pending Prescriptions Disp Refills   zolpidem  (AMBIEN ) 10 MG tablet 30 tablet 5    Sig: Take 1 tablet (10 mg total) by mouth at bedtime.     Not Delegated - Psychiatry:  Anxiolytics/Hypnotics Failed - 02/19/2024  8:02 AM      Failed - This refill cannot be delegated      Failed - Urine Drug Screen completed in last 360 days      Passed - Valid encounter within last 6 months    Recent Outpatient Visits           2 months ago Essential hypertension   [place] [facility] at [facility], Leita DEL, MD   5 months ago Abscess of parasternal chest wall   [facility] at [facility], Selinda PARAS, MD   6 months ago Greater trochanteric pain syndrome of right lower extremity   [facility] Medicine at [facility], Selinda PARAS, MD

## 2024-02-23 DIAGNOSIS — E119 Type 2 diabetes mellitus without complications: Secondary | ICD-10-CM | POA: Diagnosis not present

## 2024-02-23 DIAGNOSIS — H5203 Hypermetropia, bilateral: Secondary | ICD-10-CM | POA: Diagnosis not present

## 2024-02-23 DIAGNOSIS — H53001 Unspecified amblyopia, right eye: Secondary | ICD-10-CM | POA: Diagnosis not present

## 2024-02-23 DIAGNOSIS — H52223 Regular astigmatism, bilateral: Secondary | ICD-10-CM | POA: Diagnosis not present

## 2024-02-23 DIAGNOSIS — Z7984 Long term (current) use of oral hypoglycemic drugs: Secondary | ICD-10-CM | POA: Diagnosis not present

## 2024-02-23 DIAGNOSIS — H524 Presbyopia: Secondary | ICD-10-CM | POA: Diagnosis not present

## 2024-02-23 DIAGNOSIS — Z961 Presence of intraocular lens: Secondary | ICD-10-CM | POA: Diagnosis not present

## 2024-02-23 DIAGNOSIS — H26492 Other secondary cataract, left eye: Secondary | ICD-10-CM | POA: Diagnosis not present

## 2024-02-23 LAB — OPHTHALMOLOGY REPORT-SCANNED

## 2024-02-29 DIAGNOSIS — H5203 Hypermetropia, bilateral: Secondary | ICD-10-CM | POA: Diagnosis not present

## 2024-02-29 DIAGNOSIS — H524 Presbyopia: Secondary | ICD-10-CM | POA: Diagnosis not present

## 2024-02-29 DIAGNOSIS — H52209 Unspecified astigmatism, unspecified eye: Secondary | ICD-10-CM | POA: Diagnosis not present

## 2024-03-11 ENCOUNTER — Other Ambulatory Visit: Payer: Self-pay | Admitting: Internal Medicine

## 2024-03-11 DIAGNOSIS — M25551 Pain in right hip: Secondary | ICD-10-CM

## 2024-03-11 NOTE — Progress Notes (Addendum)
 Paul Stokes                                          MRN: [id number]   03/11/2024   The [facility] Quality Team Specialist reviewed this patient medical record for the purposes of chart review for care gap closure. The following were reviewed: abstraction for care gap closure-diabetic eye exam.  05/28/2024- ABSTRACTED GSD FOR 2025    VBCI Quality Team

## 2024-03-12 DIAGNOSIS — E1065 Type 1 diabetes mellitus with hyperglycemia: Secondary | ICD-10-CM | POA: Diagnosis not present

## 2024-03-12 NOTE — Telephone Encounter (Signed)
 Requested medication (s) are due for refill today: yes   Requested medication (s) are on the active medication list: yes   Last refill:  12/19/23 #60 2 refills  Future visit scheduled: yes 06/14/24  Notes to clinic:  manual review of labs. Do you want to continue refills?     Requested Prescriptions  Pending Prescriptions Disp Refills   celecoxib  (CELEBREX ) 50 MG capsule [Pharmacy Med Name: Celecoxib  50 MG Oral Capsule] 60 capsule 0    Sig: TAKE 1 CAPSULE BY MOUTH TWICE DAILY AS NEEDED FOR PAIN     Analgesics:  COX2 Inhibitors Failed - 03/12/2024  1:54 PM      Failed - Manual Review: Labs are only required if the patient has taken medication for more than 8 weeks.      Passed - HGB in normal range and within 360 days    Hemoglobin  Date Value Ref Range Status  06/02/2023 14.5 13.0 - 17.7 g/dL Final         Passed - Cr in normal range and within 360 days    Creatinine, Ser  Date Value Ref Range Status  06/02/2023 1.05 0.76 - 1.27 mg/dL Final   Creatinine, Urine  Date Value Ref Range Status  10/26/2023 217  Final         Passed - HCT in normal range and within 360 days    Hematocrit  Date Value Ref Range Status  06/02/2023 44.5 37.5 - 51.0 % Final         Passed - AST in normal range and within 360 days    AST  Date Value Ref Range Status  06/02/2023 19 0 - 40 IU/L Final         Passed - ALT in normal range and within 360 days    ALT  Date Value Ref Range Status  06/02/2023 19 0 - 44 IU/L Final         Passed - eGFR is 30 or above and within 360 days    GFR calc Af Amer  Date Value Ref Range Status  09/29/2019 >60 >60 mL/min Final   GFR, Estimated  Date Value Ref Range Status  05/03/2022 >60 >60 mL/min Final    Comment:    (NOTE) Calculated using the CKD-EPI Creatinine Equation (2021)    eGFR  Date Value Ref Range Status  06/02/2023 77 >59 mL/min/1.73 Final         Passed - Patient is not pregnant      Passed - Valid encounter within last 12  months    Recent Outpatient Visits           3 months ago Essential hypertension   [place] [facility] at [facility], Leita DEL, MD   6 months ago Abscess of parasternal chest wall   [facility] Care & Sports Medicine at [facility], Selinda PARAS, MD   7 months ago Greater trochanteric pain syndrome of right lower extremity   [facility] Medicine at [facility], Selinda PARAS, MD

## 2024-03-14 ENCOUNTER — Other Ambulatory Visit: Payer: Self-pay | Admitting: Internal Medicine

## 2024-03-14 DIAGNOSIS — E1040 Type 1 diabetes mellitus with diabetic neuropathy, unspecified: Secondary | ICD-10-CM

## 2024-03-15 NOTE — Telephone Encounter (Signed)
 Requested Prescriptions  Pending Prescriptions Disp Refills   gabapentin  (NEURONTIN ) 300 MG capsule [Pharmacy Med Name: Gabapentin  300 MG Oral Capsule] 540 capsule 0    Sig: TAKE 2 CAPSULES BY MOUTH THREE TIMES DAILY     Neurology: Anticonvulsants - gabapentin  Passed - 03/15/2024  4:27 PM      Passed - Cr in normal range and within 360 days    Creatinine, Ser  Date Value Ref Range Status  06/02/2023 1.05 0.76 - 1.27 mg/dL Final   Creatinine, Urine  Date Value Ref Range Status  10/26/2023 217  Final         Passed - Completed PHQ-2 or PHQ-9 in the last 360 days      Passed - Valid encounter within last 12 months    Recent Outpatient Visits           3 months ago Essential hypertension   [place] [facility] at [facility], Leita DEL, MD   6 months ago Abscess of parasternal chest wall   [facility] at [facility], Selinda PARAS, MD   7 months ago Greater trochanteric pain syndrome of right lower extremity   [facility] Medicine at [facility], Selinda PARAS, MD

## 2024-03-22 ENCOUNTER — Other Ambulatory Visit: Payer: Self-pay | Admitting: Internal Medicine

## 2024-03-22 DIAGNOSIS — M25551 Pain in right hip: Secondary | ICD-10-CM

## 2024-03-23 NOTE — Telephone Encounter (Signed)
 Requested Prescriptions  Pending Prescriptions Disp Refills   Baclofen  5 MG TABS [Pharmacy Med Name: Baclofen  5 MG Oral Tablet] 90 tablet 0    Sig: TAKE 1 TABLET BY MOUTH AT BEDTIME AS NEEDED FOR  MUSCLE  PAIN     Analgesics:  Muscle Relaxants - baclofen  Failed - 03/23/2024  8:06 AM      Failed - Cr in normal range and within 180 days    Creatinine, Ser  Date Value Ref Range Status  06/02/2023 1.05 0.76 - 1.27 mg/dL Final   Creatinine, Urine  Date Value Ref Range Status  10/26/2023 217  Final         Failed - eGFR is 30 or above and within 180 days    GFR calc Af Amer  Date Value Ref Range Status  09/29/2019 >60 >60 mL/min Final   GFR, Estimated  Date Value Ref Range Status  05/03/2022 >60 >60 mL/min Final    Comment:    (NOTE) Calculated using the CKD-EPI Creatinine Equation (2021)    eGFR  Date Value Ref Range Status  06/02/2023 77 >59 mL/min/1.73 Final         Passed - Valid encounter within last 6 months    Recent Outpatient Visits           3 months ago Essential hypertension   [place] [facility] at [facility], Leita DEL, MD   6 months ago Abscess of parasternal chest wall   [facility] Primary Care & Sports Medicine at [facility], Selinda PARAS, MD   7 months ago Greater trochanteric pain syndrome of right lower extremity   [facility] at [facility], Selinda PARAS, MD

## 2024-04-07 ENCOUNTER — Other Ambulatory Visit: Payer: Self-pay | Admitting: Internal Medicine

## 2024-04-07 DIAGNOSIS — M25551 Pain in right hip: Secondary | ICD-10-CM

## 2024-04-08 NOTE — Telephone Encounter (Signed)
 Requested Prescriptions  Pending Prescriptions Disp Refills   celecoxib  (CELEBREX ) 50 MG capsule [Pharmacy Med Name: Celecoxib  50 MG Oral Capsule] 180 capsule 0    Sig: TAKE 1 CAPSULE BY MOUTH TWICE DAILY AS NEEDED FOR PAIN     Analgesics:  COX2 Inhibitors Failed - 04/08/2024  4:14 PM      Failed - Manual Review: Labs are only required if the patient has taken medication for more than 8 weeks.      Passed - HGB in normal range and within 360 days    Hemoglobin  Date Value Ref Range Status  06/02/2023 14.5 13.0 - 17.7 g/dL Final         Passed - Cr in normal range and within 360 days    Creatinine, Ser  Date Value Ref Range Status  06/02/2023 1.05 0.76 - 1.27 mg/dL Final   Creatinine, Urine  Date Value Ref Range Status  10/26/2023 217  Final         Passed - HCT in normal range and within 360 days    Hematocrit  Date Value Ref Range Status  06/02/2023 44.5 37.5 - 51.0 % Final         Passed - AST in normal range and within 360 days    AST  Date Value Ref Range Status  06/02/2023 19 0 - 40 IU/L Final         Passed - ALT in normal range and within 360 days    ALT  Date Value Ref Range Status  06/02/2023 19 0 - 44 IU/L Final         Passed - eGFR is 30 or above and within 360 days    GFR calc Af Amer  Date Value Ref Range Status  09/29/2019 >60 >60 mL/min Final   GFR, Estimated  Date Value Ref Range Status  05/03/2022 >60 >60 mL/min Final    Comment:    (NOTE) Calculated using the CKD-EPI Creatinine Equation (2021)    eGFR  Date Value Ref Range Status  06/02/2023 77 >59 mL/min/1.73 Final         Passed - Patient is not pregnant      Passed - Valid encounter within last 12 months    Recent Outpatient Visits           4 months ago Essential hypertension   [place] [facility] at [facility], Leita DEL, MD   6 months ago Abscess of parasternal chest wall   [facility] Care & Sports Medicine at [facility], Selinda PARAS, MD   8 months ago Greater trochanteric pain syndrome of right lower extremity   [facility] Primary Care & Sports Medicine at [facility], Selinda PARAS, MD

## 2024-04-09 ENCOUNTER — Other Ambulatory Visit: Payer: Self-pay | Admitting: Internal Medicine

## 2024-04-09 DIAGNOSIS — R809 Proteinuria, unspecified: Secondary | ICD-10-CM

## 2024-04-10 NOTE — Telephone Encounter (Signed)
 Requested Prescriptions  Pending Prescriptions Disp Refills   losartan  (COZAAR ) 25 MG tablet [Pharmacy Med Name: Losartan  Potassium 25 MG Oral Tablet] 90 tablet 0    Sig: Take 1 tablet by mouth once daily     Cardiovascular:  Angiotensin Receptor Blockers Failed - 04/10/2024 10:56 AM      Failed - Cr in normal range and within 180 days    Creatinine, Ser  Date Value Ref Range Status  06/02/2023 1.05 0.76 - 1.27 mg/dL Final   Creatinine, Urine  Date Value Ref Range Status  10/26/2023 217  Final         Failed - K in normal range and within 180 days    Potassium  Date Value Ref Range Status  06/02/2023 5.2 3.5 - 5.2 mmol/L Final         Passed - Patient is not pregnant      Passed - Last BP in normal range    BP Readings from Last 1 Encounters:  12/08/23 122/72         Passed - Valid encounter within last 6 months    Recent Outpatient Visits           4 months ago Essential hypertension   [place] [facility] at [facility], Leita DEL, MD   6 months ago Abscess of parasternal chest wall   [facility] Primary Care & Sports Medicine at [facility], Selinda PARAS, MD   8 months ago Greater trochanteric pain syndrome of right lower extremity   [facility] Care [facility] Sports Medicine at [facility], Selinda PARAS, MD

## 2024-05-02 DIAGNOSIS — E1040 Type 1 diabetes mellitus with diabetic neuropathy, unspecified: Secondary | ICD-10-CM | POA: Diagnosis not present

## 2024-05-02 DIAGNOSIS — Z9641 Presence of insulin pump (external) (internal): Secondary | ICD-10-CM | POA: Diagnosis not present

## 2024-05-02 DIAGNOSIS — E782 Mixed hyperlipidemia: Secondary | ICD-10-CM | POA: Diagnosis not present

## 2024-05-02 DIAGNOSIS — E10649 Type 1 diabetes mellitus with hypoglycemia without coma: Secondary | ICD-10-CM | POA: Diagnosis not present

## 2024-05-02 DIAGNOSIS — M79671 Pain in right foot: Secondary | ICD-10-CM | POA: Diagnosis not present

## 2024-05-02 DIAGNOSIS — E1059 Type 1 diabetes mellitus with other circulatory complications: Secondary | ICD-10-CM | POA: Diagnosis not present

## 2024-05-08 DIAGNOSIS — B351 Tinea unguium: Secondary | ICD-10-CM | POA: Diagnosis not present

## 2024-05-08 DIAGNOSIS — L600 Ingrowing nail: Secondary | ICD-10-CM | POA: Diagnosis not present

## 2024-05-08 DIAGNOSIS — I739 Peripheral vascular disease, unspecified: Secondary | ICD-10-CM | POA: Diagnosis not present

## 2024-05-08 DIAGNOSIS — E1140 Type 2 diabetes mellitus with diabetic neuropathy, unspecified: Secondary | ICD-10-CM | POA: Diagnosis not present

## 2024-05-20 DIAGNOSIS — E785 Hyperlipidemia, unspecified: Secondary | ICD-10-CM

## 2024-05-20 DIAGNOSIS — I700 Atherosclerosis of aorta: Secondary | ICD-10-CM

## 2024-05-21 NOTE — Telephone Encounter (Signed)
 Requested Prescriptions  Pending Prescriptions Disp Refills   pravastatin  (PRAVACHOL ) 40 MG tablet [Pharmacy Med Name: Pravastatin  Sodium 40 MG Oral Tablet] 30 tablet 0    Sig: Take 1 tablet by mouth once daily     Cardiovascular:  Antilipid - Statins Failed - 05/21/2024 12:33 PM      Failed - Lipid Panel in normal range within the last 12 months    Cholesterol, Total  Date Value Ref Range Status  06/02/2023 131 100 - 199 mg/dL Final   LDL Chol Calc (NIH)  Date Value Ref Range Status  06/02/2023 69 0 - 99 mg/dL Final   HDL  Date Value Ref Range Status  06/02/2023 45 >39 mg/dL Final   Triglycerides  Date Value Ref Range Status  06/02/2023 91 0 - 149 mg/dL Final         Passed - Patient is not pregnant      Passed - Valid encounter within last 12 months    Recent Outpatient Visits           5 months ago Essential hypertension   [place] [facility] at [facility], Leita DEL, MD   8 months ago Abscess of parasternal chest wall   [facility] Primary Care & Sports Medicine at [facility], Selinda PARAS, MD   9 months ago Greater trochanteric pain syndrome of right lower extremity   [facility] Primary Care & Sports Medicine at [facility], Selinda PARAS, MD

## 2024-05-31 ENCOUNTER — Other Ambulatory Visit: Payer: Self-pay

## 2024-05-31 DIAGNOSIS — F324 Major depressive disorder, single episode, in partial remission: Secondary | ICD-10-CM

## 2024-05-31 MED ORDER — BUPROPION HCL ER (SR) 150 MG PO TB12: 150.0000 mg | ORAL_TABLET | Freq: Two times a day (BID) | ORAL | 1 refills | Status: AC

## 2024-06-14 ENCOUNTER — Encounter: Payer: Self-pay | Admitting: Family Medicine

## 2024-06-14 ENCOUNTER — Ambulatory Visit: Admitting: Family Medicine

## 2024-06-14 VITALS — BP 130/72 | HR 109 | Temp 98.4°F | Ht 67.0 in | Wt 186.4 lb

## 2024-06-14 DIAGNOSIS — G4700 Insomnia, unspecified: Secondary | ICD-10-CM

## 2024-06-14 DIAGNOSIS — Z9641 Presence of insulin pump (external) (internal): Secondary | ICD-10-CM

## 2024-06-14 DIAGNOSIS — I10 Essential (primary) hypertension: Secondary | ICD-10-CM

## 2024-06-14 DIAGNOSIS — E1069 Type 1 diabetes mellitus with other specified complication: Secondary | ICD-10-CM | POA: Diagnosis not present

## 2024-06-14 DIAGNOSIS — E1040 Type 1 diabetes mellitus with diabetic neuropathy, unspecified: Secondary | ICD-10-CM

## 2024-06-14 DIAGNOSIS — G250 Essential tremor: Secondary | ICD-10-CM

## 2024-06-14 DIAGNOSIS — F324 Major depressive disorder, single episode, in partial remission: Secondary | ICD-10-CM

## 2024-06-14 DIAGNOSIS — I739 Peripheral vascular disease, unspecified: Secondary | ICD-10-CM | POA: Diagnosis not present

## 2024-06-14 DIAGNOSIS — Z23 Encounter for immunization: Secondary | ICD-10-CM | POA: Diagnosis not present

## 2024-06-14 DIAGNOSIS — E785 Hyperlipidemia, unspecified: Secondary | ICD-10-CM

## 2024-06-14 DIAGNOSIS — Z20828 Contact with and (suspected) exposure to other viral communicable diseases: Secondary | ICD-10-CM | POA: Diagnosis not present

## 2024-06-14 DIAGNOSIS — J9621 Acute and chronic respiratory failure with hypoxia: Secondary | ICD-10-CM | POA: Insufficient documentation

## 2024-06-14 DIAGNOSIS — J449 Chronic obstructive pulmonary disease, unspecified: Secondary | ICD-10-CM | POA: Diagnosis not present

## 2024-06-14 MED ORDER — TRAZODONE HCL 50 MG PO TABS: 25.0000 mg | ORAL_TABLET | Freq: Every evening | ORAL | 3 refills | Status: AC | PRN

## 2024-06-14 MED ORDER — OSELTAMIVIR PHOSPHATE 75 MG PO CAPS: 75.0000 mg | ORAL_CAPSULE | Freq: Two times a day (BID) | ORAL | 0 refills | Status: AC

## 2024-06-14 MED ORDER — ZOLPIDEM TARTRATE 10 MG PO TABS: 10.0000 mg | ORAL_TABLET | Freq: Every day | ORAL | 0 refills | Status: AC

## 2024-06-14 NOTE — Progress Notes (Signed)
 "  Established Patient Office Visit  Patient ID: Paul Stokes, male    DOB: [birth year]  Age: 69 y.o. MRN: [id number] PCP: Yarisa Lynam K, MD  Chief Complaint  Patient presents with   Refills    Patient needs refills on medications    Subjective:     HPI  Discussed the use of AI scribe software for clinical note transcription with the patient, who gave verbal consent to proceed.  History of Present Illness Paul Stokes is a 69 year old male who presents for a regular checkup.  He has a history of diabetes, diagnosed in 2009, and was initially treated as type 2 before being switched to insulin . He was started on metformin before being switched to insulin  after an ER visit where his blood glucose was 375 mg/dL. He currently uses an insulin  pump, delivering up to 80 units per day, though he believes he uses less now. His recent A1c levels have been around 6.5 to 7.0. He reports occasional mild highs or lows in his blood sugar, but not much.  He has a history of COPD and was prescribed Trelegy, but finds it unaffordable. He quit smoking three years ago after a 52-year history and reports no current use of oxygen therapy. He uses an albuterol  inhaler as needed, though he hasn't used it recently.  He experiences a familial tremor, which occurs when trying to hold objects, particularly in the morning. He does not take medication for this condition.  He has a history of depression, for which he takes Wellbutrin , and he reports that it helps.  He reports sleep disturbances, for which he takes Ambien . He has tried other methods like Benadryl without success and is concerned about the long-term effects of Ambien  but has not found an alternative that works for him.  He has a history of high blood pressure and takes medication for it, as well as cholesterol medication without side effects.  He reports a recent onset of flu-like symptoms, including a cough that worsens at night, after his wife  was diagnosed with the flu.  He has a history of bladder issues, previously experiencing frequent urination and incomplete bladder emptying. This was resolved with surgery, and he reports no ongoing problems or need for medication related to this issue.  He has chronic back pain, which previously required steroid injections, but he has not needed treatment for over a year. He takes baclofen  for his back pain as needed.     Review of Systems  All other systems reviewed and are negative.     Objective:     BP 130/72   Pulse (!) 109   Temp 98.4 F (36.9 C) (Oral)   Ht 5' 7 (1.702 m)   Wt 186 lb 6.4 oz (84.6 kg)   SpO2 97%   BMI 29.19 kg/m     Physical Exam Vitals and nursing note reviewed.  Constitutional:      Appearance: Normal appearance.  HENT:     Head: Normocephalic.     Right Ear: External ear normal.     Left Ear: External ear normal.  Eyes:     Conjunctiva/sclera: Conjunctivae normal.  Cardiovascular:     Rate and Rhythm: Normal rate.  Pulmonary:     Effort: Pulmonary effort is normal. No respiratory distress.  Abdominal:     Palpations: Abdomen is soft.  Musculoskeletal:        General: Normal range of motion.  Skin:    General: Skin is  warm.  Neurological:     Mental Status: He is alert and oriented to person, place, and time.  Psychiatric:        Mood and Affect: Mood normal.     Physical Exam VITALS: BP- 130/72 NEUROLOGICAL: Tremor present.   No results found for any visits on 06/14/24.     The 10-year ASCVD risk score (Arnett DK, et al., 2019) is: 30%    Assessment & Plan:   Problem List Items Addressed This Visit       Cardiovascular and Mediastinum   Essential hypertension (Chronic)     Respiratory   Acute and chronic respiratory failure with hypoxia (HCC) - Primary     Endocrine   Type 1 diabetes mellitus with sensory neuropathy (HCC) (Chronic)   Hyperlipidemia due to type 1 diabetes mellitus (HCC) (Chronic)     Nervous  and Auditory   Benign essential tremor (Chronic)     Other   Major depression single episode, in partial remission (Chronic)   Relevant Medications   traZODone  (DESYREL ) 50 MG tablet   Insomnia, persistent   Relevant Medications   zolpidem  (AMBIEN ) 10 MG tablet   Presence of insulin  pump   Other Visit Diagnoses       Moderate COPD (chronic obstructive pulmonary disease) (HCC)         Immunization due       Relevant Orders   Td : Tetanus/diphtheria >7yo Preservative  free (Completed)     Exposure to the flu         PAD (peripheral artery disease)       Relevant Orders   Ambulatory referral to Vascular Surgery       Assessment and Plan Assessment & Plan Suspected influenza infection Suspected due to exposure to wife with flu. Symptoms include nighttime cough and chest discomfort. - Prescribed Tamiflu .  Type 1 diabetes mellitus complicated by sensory neuropathy and hyperlipidemia Type 1 diabetes managed with insulin  pump and semaglutide. A1c levels 6.5-7.0. Sensory neuropathy managed with gabapentin . Hyperlipidemia managed with cholesterol medication. - Continue insulin  pump therapy. - Continue semaglutide (Rybelsus). - Continue cholesterol medication.  Chronic obstructive pulmonary disease (COPD) COPD managed with Trelegy. No current albuterol  or home oxygen use. Smoking cessation achieved three years ago. On trelgy , states he can't afford it and wants to check with Pulm for alternative.  Major depressive disorder, single episode, in partial remission Managed with Wellbutrin , effective. - Continue Wellbutrin .  Chronic insomnia Managed with Ambien . Discussed long-term risks. Recommended melatonin and trazodone . - Prescribed trazodone  50 mg, start with half a pill at bedtime. - Recommended over-the-counter melatonin gummies. - Continue Ambien  as needed, aim to transition to trazodone  and melatonin.  PAD? - Vasc Surg referral placed.  General health maintenance Up  to date with flu vaccination. Due for second shingles and hepatitis A and B vaccinations. - Will administer second shingles vaccine. - Will administer hepatitis A and B vaccinations.    No follow-ups on file.    Maryrose Colvin K Chaz Ronning, MD [facility] Primary Care & Sports Medicine at [facility]   "

## 2024-06-18 ENCOUNTER — Other Ambulatory Visit: Payer: Self-pay | Admitting: Family Medicine

## 2024-06-18 DIAGNOSIS — I700 Atherosclerosis of aorta: Secondary | ICD-10-CM

## 2024-06-18 DIAGNOSIS — E1069 Type 1 diabetes mellitus with other specified complication: Secondary | ICD-10-CM

## 2024-06-19 ENCOUNTER — Other Ambulatory Visit: Payer: Self-pay

## 2024-06-19 ENCOUNTER — Other Ambulatory Visit: Payer: Self-pay | Admitting: Family Medicine

## 2024-06-19 DIAGNOSIS — E1040 Type 1 diabetes mellitus with diabetic neuropathy, unspecified: Secondary | ICD-10-CM

## 2024-06-19 MED ORDER — GABAPENTIN 300 MG PO CAPS: 600.0000 mg | ORAL_CAPSULE | Freq: Three times a day (TID) | ORAL | 0 refills | Status: AC

## 2024-06-19 NOTE — Telephone Encounter (Signed)
 Requested medication (s) are due for refill today: review  Requested medication (s) are on the active medication list: yes  Last refill:  05/21/24 #30/0  Future visit scheduled: no  Notes to clinic:  Unable to refill per protocol due to failed labs, no updated results.      Requested Prescriptions  Pending Prescriptions Disp Refills   pravastatin  (PRAVACHOL ) 40 MG tablet [Pharmacy Med Name: Pravastatin  Sodium 40 MG Oral Tablet] 30 tablet 0    Sig: Take 1 tablet by mouth once daily     Cardiovascular:  Antilipid - Statins Failed - 06/19/2024  1:06 PM      Failed - Lipid Panel in normal range within the last 12 months    Cholesterol, Total  Date Value Ref Range Status  06/02/2023 131 100 - 199 mg/dL Final   LDL Chol Calc (NIH)  Date Value Ref Range Status  06/02/2023 69 0 - 99 mg/dL Final   HDL  Date Value Ref Range Status  06/02/2023 45 >39 mg/dL Final   Triglycerides  Date Value Ref Range Status  06/02/2023 91 0 - 149 mg/dL Final         Passed - Patient is not pregnant      Passed - Valid encounter within last 12 months    Recent Outpatient Visits           5 days ago Moderate COPD (chronic obstructive pulmonary disease) (HCC)   [place] [facility] at [facility], Vinay K, MD   6 months ago Essential hypertension    Primary Care & Sports Medicine at [facility], Leita DEL, MD   9 months ago Abscess of parasternal chest wall   [facility] Primary Care & Sports Medicine at [facility], Selinda PARAS, MD   10 months ago Greater trochanteric pain syndrome of right lower extremity   [facility] Primary Care & Sports Medicine at [facility], Selinda PARAS, MD

## 2024-06-28 ENCOUNTER — Encounter (INDEPENDENT_AMBULATORY_CARE_PROVIDER_SITE_OTHER): Payer: Self-pay | Admitting: Vascular Surgery

## 2024-06-28 ENCOUNTER — Encounter (INDEPENDENT_AMBULATORY_CARE_PROVIDER_SITE_OTHER): Payer: Self-pay

## 2024-12-12 ENCOUNTER — Ambulatory Visit: Admitting: Family Medicine

## 2025-01-22 ENCOUNTER — Ambulatory Visit
